# Patient Record
Sex: Male | Born: 1985
Health system: Southern US, Community
[De-identification: ages and names within clinical notes are randomized; demographics above are authoritative.]

## PROBLEM LIST (undated history)

## (undated) DIAGNOSIS — R0789 Other chest pain: Secondary | ICD-10-CM

## (undated) DIAGNOSIS — R002 Palpitations: Secondary | ICD-10-CM

## (undated) DIAGNOSIS — J45909 Unspecified asthma, uncomplicated: Secondary | ICD-10-CM

## (undated) DIAGNOSIS — F1910 Other psychoactive substance abuse, uncomplicated: Secondary | ICD-10-CM

## (undated) DIAGNOSIS — G4700 Insomnia, unspecified: Secondary | ICD-10-CM

## (undated) DIAGNOSIS — F1010 Alcohol abuse, uncomplicated: Secondary | ICD-10-CM

## (undated) DIAGNOSIS — F17200 Nicotine dependence, unspecified, uncomplicated: Secondary | ICD-10-CM

## (undated) HISTORY — DX: Other chest pain: R07.89

## (undated) HISTORY — DX: Insomnia, unspecified: G47.00

## (undated) HISTORY — DX: Nicotine dependence, unspecified, uncomplicated: F17.200

## (undated) HISTORY — DX: Unspecified asthma, uncomplicated: J45.909

## (undated) HISTORY — DX: Palpitations: R00.2

## (undated) HISTORY — DX: Alcohol abuse, uncomplicated: F10.10

---
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
## 2007-03-12 ENCOUNTER — Emergency Department: Payer: Self-pay | Admitting: Emergency Medicine

## 2007-03-13 ENCOUNTER — Emergency Department: Payer: Self-pay | Admitting: Emergency Medicine

## 2007-03-16 ENCOUNTER — Emergency Department: Payer: Self-pay | Admitting: Emergency Medicine

## 2007-03-22 ENCOUNTER — Emergency Department: Payer: Self-pay | Admitting: Emergency Medicine

## 2008-06-15 ENCOUNTER — Emergency Department (HOSPITAL_COMMUNITY): Admission: EM | Admit: 2008-06-15 | Discharge: 2008-06-15 | Payer: Self-pay | Admitting: Family Medicine

## 2008-06-19 ENCOUNTER — Emergency Department (HOSPITAL_COMMUNITY): Admission: EM | Admit: 2008-06-19 | Discharge: 2008-06-19 | Payer: Self-pay | Admitting: Emergency Medicine

## 2010-06-08 ENCOUNTER — Emergency Department (HOSPITAL_COMMUNITY): Admission: EM | Admit: 2010-06-08 | Discharge: 2010-06-08 | Payer: Self-pay | Admitting: Emergency Medicine

## 2010-06-25 ENCOUNTER — Emergency Department (HOSPITAL_COMMUNITY)
Admission: EM | Admit: 2010-06-25 | Discharge: 2010-06-25 | Payer: Self-pay | Source: Home / Self Care | Admitting: Emergency Medicine

## 2010-10-02 LAB — CULTURE, ROUTINE-ABSCESS

## 2011-04-24 LAB — POCT I-STAT, CHEM 8
BUN: 10
Calcium, Ion: 1.2
Chloride: 105
Creatinine, Ser: 1.2
Glucose, Bld: 104 — ABNORMAL HIGH
HCT: 49
Hemoglobin: 16.7
Potassium: 3.8
Sodium: 141
TCO2: 26

## 2012-01-25 ENCOUNTER — Emergency Department (HOSPITAL_COMMUNITY): Payer: Self-pay

## 2012-01-25 ENCOUNTER — Emergency Department (HOSPITAL_COMMUNITY)
Admission: EM | Admit: 2012-01-25 | Discharge: 2012-01-25 | Disposition: A | Discharge status: Home / Self Care | Payer: Self-pay | Attending: Emergency Medicine | Admitting: Emergency Medicine

## 2012-01-25 ENCOUNTER — Encounter (HOSPITAL_COMMUNITY): Payer: Self-pay | Admitting: *Deleted

## 2012-01-25 ENCOUNTER — Encounter (HOSPITAL_COMMUNITY): Payer: Self-pay | Admitting: Emergency Medicine

## 2012-01-25 DIAGNOSIS — W2209XA Striking against other stationary object, initial encounter: Secondary | ICD-10-CM | POA: Insufficient documentation

## 2012-01-25 DIAGNOSIS — F17200 Nicotine dependence, unspecified, uncomplicated: Secondary | ICD-10-CM | POA: Insufficient documentation

## 2012-01-25 DIAGNOSIS — S63279A Dislocation of unspecified interphalangeal joint of unspecified finger, initial encounter: Secondary | ICD-10-CM | POA: Insufficient documentation

## 2012-01-25 DIAGNOSIS — IMO0002 Reserved for concepts with insufficient information to code with codable children: Secondary | ICD-10-CM | POA: Insufficient documentation

## 2012-01-25 DIAGNOSIS — S63259A Unspecified dislocation of unspecified finger, initial encounter: Secondary | ICD-10-CM

## 2012-01-25 DIAGNOSIS — Y9383 Activity, rough housing and horseplay: Secondary | ICD-10-CM | POA: Insufficient documentation

## 2012-01-25 DIAGNOSIS — S63289A Dislocation of proximal interphalangeal joint of unspecified finger, initial encounter: Secondary | ICD-10-CM

## 2012-01-25 NOTE — ED Notes (Signed)
PT was just discharged after finger being put back in place and finger became dislocated again.

## 2012-01-25 NOTE — ED Notes (Addendum)
Pt to radiology with tx person RS

## 2012-01-25 NOTE — ED Provider Notes (Signed)
Medical screening examination/treatment/procedure(s) were performed by non-physician practitioner and as supervising physician I was immediately available for consultation/collaboration.  Flint Melter, MD 01/25/12 [phone]

## 2012-01-25 NOTE — ED Notes (Signed)
Pt back from radiology 

## 2012-01-25 NOTE — ED Provider Notes (Signed)
History     CSN: [id number]  Arrival date & time 01/25/12  0301   First MD Initiated Contact with Patient 01/25/12 0320      Chief Complaint  Patient presents with  . Finger Injury   HPI  History provided by the patient. Patient is 26 year old male who returns with complaints of dislocation to his left fifth finger. Patient was seen and treated for similar injury earlier this morning. Patient was treated with successful clinical reduction and had normal function of the finger. Finger was wrapped the body tape. Patient states that he remove this when he returned home and was getting to the shower. He reports bumping his behavior is a shower edge and had a recurrence of dislocation when he flexed his finger. Patient attempted to try to reposition the finger but states that he can get back and was nervous about performing this reduction by himself at home. Patient reports having some pain and discomfort to the finger but states is minimal. He denies any numbness or tingling. Symptoms are described as mild. He denies any other aggravating or alleviating factors.    No past medical history on file.  No past surgical history on file.  No family history on file.  History  Substance Use Topics  . Smoking status: Current Everyday Smoker  . Smokeless tobacco: Not on file  . Alcohol Use: Yes      Review of Systems  Musculoskeletal:       Finger deformity  Neurological: Negative for numbness.    Allergies  Review of patient's allergies indicates no known allergies.  Home Medications  No current outpatient prescriptions on file.  BP 141/69  Pulse 101  Temp 98.6 F (37 C) (Oral)  Resp 16  SpO2 100%  Physical Exam  Nursing note and vitals reviewed. Constitutional: He is oriented to person, place, and time. He appears well-developed and well-nourished. No distress.  HENT:  Head: Normocephalic.  Cardiovascular: Normal rate and regular rhythm.   Pulmonary/Chest: Effort normal  and breath sounds normal.  Musculoskeletal:       Similar deformity of left fifth finger with flexion and radial displacement of distal portion of finger at PIP. Normal distal medial and lateral sensation still finger. Normal cap refill less than 2 seconds.  Neurological: He is alert and oriented to person, place, and time.  Skin: Skin is warm.  Psychiatric: He has a normal mood and affect. His behavior is normal.    ED Course  Reduction of dislocation Date/Time: 01/25/2012 4:00 AM Performed by: Angus Seller Authorized by: Angus Seller Consent: Verbal consent obtained. Risks and benefits: risks, benefits and alternatives were discussed Consent given by: patient Patient identity confirmed: verbally with patient Time out: Immediately prior to procedure a "time out" was called to verify the correct patient, procedure, equipment, support staff and site/side marked as required. Local anesthesia used: no Patient tolerance: Patient tolerated the procedure well with no immediate complications. Comments: Reduction left fifth finger.  Post reduction films obtained showing good anatomical alignment.     Dg Finger Little Left  01/25/2012  *RADIOLOGY REPORT*  Clinical Data: Left finger pain  LEFT LITTLE FINGER 2+V  Comparison: None.  Findings: There is lateral subluxation and hyperflexion of the fifth digit at the PIP joint.  No fracture identified.  No aggressive osseous lesion.  IMPRESSION: Lateral subluxation and hyperflexion of the fifth digit at the PIP joint.  Original Report Authenticated By: Waneta Martins, M.D.     1.  Dislocation of finger PIP joint       MDM  3:20 AM patient seen and evaluated. Patient returns with recurrence of dislocation to left fifth finger. Patient was seen and treated for this earlier this morning.        Angus Seller, [place] 01/25/12 [phone]

## 2012-01-25 NOTE — Progress Notes (Signed)
Orthopedic Tech Progress Note Patient Details:  Marc Henderson 1986/04/12 [id number]  Ortho Devices Type of Ortho Device: Finger splint Ortho Device/Splint Location: Left hand, 5th digit Ortho Device/Splint Interventions: Application   [name] 01/25/2012, 5:27 AM

## 2012-01-25 NOTE — ED Notes (Signed)
Deformity of the  Lt little finger playing around

## 2012-01-25 NOTE — ED Provider Notes (Signed)
History     CSN: [id number]  Arrival date & time 01/25/12  0000   First MD Initiated Contact with Patient 01/25/12 0026      Chief Complaint  Patient presents with  . finger deformity    HPI  History provided by the patient. Patient is a 26 year old male with no significant past medical history presents with complaint place of left little finger injury and deformity. Patient states he was playing around with brother wrestling when he had injury to his finger. Patient complains of deformity and pain. Patient has reduced range of motion secondary deformity. He denies any other aggravating or alleviating factors. Patient denies any other associated symptoms. Denies any numbness.   History reviewed. No pertinent past medical history.  History reviewed. No pertinent past surgical history.  No family history on file.  History  Substance Use Topics  . Smoking status: Current Everyday Smoker  . Smokeless tobacco: Not on file  . Alcohol Use: Yes      Review of Systems  Musculoskeletal:       Finger deformity  Neurological: Negative for weakness and numbness.    Allergies  Review of patient's allergies indicates no known allergies.  Home Medications  No current outpatient prescriptions on file.  BP 136/74  Pulse 104  Temp 98.3 F (36.8 C) (Oral)  Resp 20  SpO2 97%  Physical Exam  Nursing note and vitals reviewed. Constitutional: He is oriented to person, place, and time. He appears well-developed and well-nourished. No distress.  HENT:  Head: Normocephalic.  Cardiovascular: Normal rate and regular rhythm.   Pulmonary/Chest: Effort normal and breath sounds normal.  Musculoskeletal:       Reduced range of motion of left fifth finger. There is deformity with flexion of the finger at the PIP. Normal medial and lateral sensations. Normal cap refill less than 2 seconds.  Neurological: He is alert and oriented to person, place, and time.  Skin: Skin is warm. No erythema.    Psychiatric: He has a normal mood and affect. His behavior is normal.    ED Course  Reduction of dislocation Date/Time: 01/25/2012 1:00 AM Performed by: Angus Seller Authorized by: Angus Seller Consent: Verbal consent obtained. Risks and benefits: risks, benefits and alternatives were discussed Consent given by: patient Imaging studies: imaging studies available Patient identity confirmed: verbally with patient Time out: Immediately prior to procedure a "time out" was called to verify the correct patient, procedure, equipment, support staff and site/side marked as required. Local anesthesia used: no Patient tolerance: Patient tolerated the procedure well with no immediate complications. Comments: Reduction of left fifth finger successful with full range of motion, strength and neurovascularly intact post reduction.    Dg Finger Little Left  01/25/2012  *RADIOLOGY REPORT*  Clinical Data: Left finger pain  LEFT LITTLE FINGER 2+V  Comparison: None.  Findings: There is lateral subluxation and hyperflexion of the fifth digit at the PIP joint.  No fracture identified.  No aggressive osseous lesion.  IMPRESSION: Lateral subluxation and hyperflexion of the fifth digit at the PIP joint.  Original Report Authenticated By: Waneta Martins, M.D.     1. Finger dislocation       MDM  12:50 AM patient seen and evaluated. Patient in no acute distress.        Angus Seller, PA 01/25/12 0121  Angus Seller, PA 01/25/12 [phone]

## 2012-01-26 NOTE — ED Provider Notes (Signed)
Medical screening examination/treatment/procedure(s) were performed by non-physician practitioner and as supervising physician I was immediately available for consultation/collaboration.  Cristi Gwynn, MD 01/26/12 0435 

## 2012-08-04 ENCOUNTER — Emergency Department (INDEPENDENT_AMBULATORY_CARE_PROVIDER_SITE_OTHER): Payer: Self-pay

## 2012-08-04 ENCOUNTER — Emergency Department (INDEPENDENT_AMBULATORY_CARE_PROVIDER_SITE_OTHER)
Admission: EM | Admit: 2012-08-04 | Discharge: 2012-08-04 | Disposition: A | Discharge status: Home / Self Care | Payer: Self-pay | Source: Home / Self Care | Attending: Family Medicine | Admitting: Family Medicine

## 2012-08-04 ENCOUNTER — Encounter (HOSPITAL_COMMUNITY): Payer: Self-pay

## 2012-08-04 DIAGNOSIS — J45909 Unspecified asthma, uncomplicated: Secondary | ICD-10-CM

## 2012-08-04 MED ORDER — ALBUTEROL SULFATE HFA 108 (90 BASE) MCG/ACT IN AERS: 1.0000 | INHALATION_SPRAY | Freq: Four times a day (QID) | RESPIRATORY_TRACT | Status: DC | PRN

## 2012-08-04 MED ORDER — ALBUTEROL SULFATE (5 MG/ML) 0.5% IN NEBU
INHALATION_SOLUTION | RESPIRATORY_TRACT | Status: AC
  Filled 2012-08-04: qty 1

## 2012-08-04 MED ORDER — IPRATROPIUM BROMIDE 0.02 % IN SOLN
0.5000 mg | Freq: Once | RESPIRATORY_TRACT | Status: AC
  Administered 2012-08-04: 0.5 mg via RESPIRATORY_TRACT

## 2012-08-04 MED ORDER — ALBUTEROL SULFATE (5 MG/ML) 0.5% IN NEBU
2.5000 mg | INHALATION_SOLUTION | Freq: Once | RESPIRATORY_TRACT | Status: AC
  Administered 2012-08-04: 2.5 mg via RESPIRATORY_TRACT

## 2012-08-04 MED ORDER — ALBUTEROL SULFATE HFA 108 (90 BASE) MCG/ACT IN AERS
2.0000 | INHALATION_SPRAY | RESPIRATORY_TRACT | Status: DC
  Administered 2012-08-04: 2 via RESPIRATORY_TRACT

## 2012-08-04 MED ORDER — PREDNISONE 10 MG PO TABS: ORAL_TABLET | ORAL | Status: DC

## 2012-08-04 MED ORDER — ALBUTEROL SULFATE HFA 108 (90 BASE) MCG/ACT IN AERS
INHALATION_SPRAY | RESPIRATORY_TRACT | Status: AC
  Filled 2012-08-04: qty 6.7

## 2012-08-04 NOTE — ED Notes (Signed)
Reported URI x 5-6 days

## 2012-08-04 NOTE — ED Provider Notes (Signed)
History     CSN: [id number]  Arrival date & time 08/04/12  1706   First MD Initiated Contact with Patient 08/04/12 1729      Chief Complaint  Patient presents with  . Cough    (Consider location/radiation/quality/duration/timing/severity/associated sxs/prior treatment) Patient is a 27 y.o. male presenting with cough. The history is provided by the patient. No language interpreter was used.  Cough This is a new problem. The problem occurs constantly. The problem has been gradually worsening. The cough is productive of sputum. There has been no fever. Associated symptoms include shortness of breath and wheezing. He has tried nothing for the symptoms. The treatment provided no relief. He is a smoker. His past medical history does not include pneumonia or asthma.    History reviewed. No pertinent past medical history.  History reviewed. No pertinent past surgical history.  History reviewed. No pertinent family history.  History  Substance Use Topics  . Smoking status: Current Every Day Smoker  . Smokeless tobacco: Not on file  . Alcohol Use: Yes      Review of Systems  Respiratory: Positive for cough, shortness of breath and wheezing.   All other systems reviewed and are negative.    Allergies  Review of patient's allergies indicates no known allergies.  Home Medications  No current outpatient prescriptions on file.  BP 132/84  Pulse 113  Temp 98.1 F (36.7 C) (Oral)  Resp 20  SpO2 95%  Physical Exam  Constitutional: He is oriented to person, place, and time. He appears well-developed and well-nourished.  HENT:  Head: Normocephalic.  Eyes: EOM are normal. Pupils are equal, round, and reactive to light.  Neck: Normal range of motion.  Pulmonary/Chest: He has wheezes.  Abdominal: Soft. He exhibits no distension.  Musculoskeletal: Normal range of motion.  Neurological: He is alert and oriented to person, place, and time.  Skin: Skin is warm.  Psychiatric: He  has a normal mood and affect.    ED Course  Procedures (including critical care time)  Labs Reviewed - No data to display Dg Chest 2 View  08/04/2012  *RADIOLOGY REPORT*  Clinical Data: Cough, congestion, shortness of breath, fever  CHEST - 2 VIEW  Comparison: None.  Findings: Normal cardiac silhouette and mediastinal contours. There is mild diffuse thickening of the pulmonary interstitium.  No focal airspace opacities.  No pleural effusion or pneumothorax.  No acute osseous abnormalities.  IMPRESSION: Findings suggestive of airways disease/bronchitis.  No focal airspace opacities to suggest pneumonia.   Original Report Authenticated By: Tacey Ruiz, MD      1. Asthma       MDM  Pt given prednisone, albuterol and atrovent x 1 then repeat albuterol.   Pt feels much better.  Pt advised stop smoking.   Primary care feferrals given and pt counseled on asthma        Lonia Skinner [place], [place] 08/04/12 2030

## 2012-08-09 NOTE — ED Provider Notes (Signed)
Medical screening examination/treatment/procedure(s) were performed by resident physician or non-physician practitioner and as supervising physician I was immediately available for consultation/collaboration.   Barkley Bruns MD.    Linna Hoff, MD 08/09/12 1723

## 2013-11-05 ENCOUNTER — Encounter (HOSPITAL_COMMUNITY): Payer: Self-pay | Admitting: Emergency Medicine

## 2013-11-05 ENCOUNTER — Emergency Department (INDEPENDENT_AMBULATORY_CARE_PROVIDER_SITE_OTHER)
Admission: EM | Admit: 2013-11-05 | Discharge: 2013-11-05 | Disposition: A | Discharge status: Home / Self Care | Payer: Self-pay | Source: Home / Self Care | Attending: Family Medicine | Admitting: Family Medicine

## 2013-11-05 DIAGNOSIS — S61209A Unspecified open wound of unspecified finger without damage to nail, initial encounter: Secondary | ICD-10-CM

## 2013-11-05 DIAGNOSIS — S61219A Laceration without foreign body of unspecified finger without damage to nail, initial encounter: Secondary | ICD-10-CM

## 2013-11-05 DIAGNOSIS — W269XXA Contact with unspecified sharp object(s), initial encounter: Secondary | ICD-10-CM

## 2013-11-05 NOTE — ED Notes (Signed)
Laceration to right middle finger .  Numbness to posterior middle finger.  Pocket knife closed on finger.

## 2013-11-05 NOTE — ED Notes (Signed)
Patient reports last tetanus 7 years ago

## 2013-11-05 NOTE — Discharge Instructions (Signed)

## 2013-11-05 NOTE — ED Provider Notes (Signed)
CSN: [id number]     Arrival date & time 11/05/13  1426 History   First MD Initiated Contact with Patient 11/05/13 1644     Chief Complaint  Patient presents with  . Laceration   (Consider location/radiation/quality/duration/timing/severity/associated sxs/prior Treatment) Patient is a 28 y.o. male presenting with skin laceration. The history is provided by the patient. No language interpreter was used.  Laceration Location:  Finger Finger laceration location:  L middle finger Length (cm):  1 Depth:  Through dermis Bleeding: controlled   Time since incident:  2 hours Laceration mechanism:  Knife Pain details:    Quality:  Aching   Severity:  Mild Foreign body present:  No foreign bodies Worsened by:  Nothing tried Ineffective treatments:  None tried Tetanus status:  Up to date   History reviewed. No pertinent past medical history. History reviewed. No pertinent past surgical history. No family history on file. History  Substance Use Topics  . Smoking status: Current Every Day Smoker  . Smokeless tobacco: Not on file  . Alcohol Use: Yes    Review of Systems  Allergies  Review of patient's allergies indicates no known allergies.  Home Medications   Prior to Admission medications   Medication Sig Start Date End Date Taking? Authorizing Provider  albuterol (PROVENTIL HFA;VENTOLIN HFA) 108 (90 BASE) MCG/ACT inhaler Inhale 1-2 puffs into the lungs every 6 (six) hours as needed for wheezing. 08/04/12   Elson AreasLeslie K Eustacia Urbanek, PA-C  predniSONE (DELTASONE) 10 MG tablet 5,4,3,2,1 taper 08/04/12   Elson AreasLeslie K Nocholas Damaso, PA-C   BP 123/69  Pulse 58  Temp(Src) 98.2 F (36.8 C) (Oral)  Resp 14  SpO2 100% Physical Exam  Constitutional: He appears well-developed and well-nourished.  HENT:  Head: Normocephalic.  Musculoskeletal: He exhibits tenderness.  1cm laceration dorsal aspect of 3rd finger  Neurological: He is alert.  Skin: Skin is warm.  Psychiatric: He has a normal mood and affect.     ED Course  LACERATION REPAIR Date/Time: 11/05/2013 6:21 PM Performed by: Elson AreasSOFIA, Jontavious Commons K Authorized by: Elson AreasSOFIA, Nicha Hemann K Consent: Verbal consent obtained. Consent given by: patient Patient understanding: patient states understanding of the procedure being performed Body area: upper extremity Location details: left long finger Laceration length: 1 cm Foreign bodies: no foreign bodies Tendon involvement: none Nerve involvement: none Vascular damage: no Skin closure: glue Patient tolerance: Patient tolerated the procedure well with no immediate complications.   (including critical care time) Labs Review Labs Reviewed - No data to display  Results for orders placed during the hospital encounter of 06/25/10  CULTURE, ROUTINE-ABSCESS      Result Value Ref Range   Specimen Description ABSCESS NECK     Special Requests NONE     Gram Stain       Value: ABUNDANT WBC PRESENT,BOTH PMN AND MONONUCLEAR     FEW SQUAMOUS EPITHELIAL CELLS PRESENT     FEW GRAM POSITIVE COCCI IN PAIRS     IN CLUSTERS   Culture       Value: MODERATE METHICILLIN RESISTANT STAPHYLOCOCCUS AUREUS     Note: RIFAMPIN AND GENTAMICIN SHOULD NOT BE USED AS SINGLE DRUGS FOR TREATMENT OF STAPH INFECTIONS. This organism DOES NOT demonstrate inducible Clindamycin resistance in vitro. CRITICAL RESULT CALLED TO, READ BACK BY AND VERIFIED WITH: TERESA 06/28/10      AT 845 AM BY [facility]   Report Status 06/28/2010 FINAL     Organism ID, Bacteria METHICILLIN RESISTANT STAPHYLOCOCCUS AUREUS     Imaging Review No results found.  MDM   1. Laceration of finger        Elson AreasLeslie K Timira Bieda, PA-C 11/05/13 1823  Lonia SkinnerLeslie K RaymondSofia, [place] JerseyPA-C 11/05/13 1824

## 2013-11-06 NOTE — ED Provider Notes (Signed)
Medical screening examination/treatment/procedure(s) were performed by a resident physician or non-physician practitioner and as the supervising physician I was immediately available for consultation/collaboration.  Tiona Ruane, MD    Hendry Speas S Anjolaoluwa Siguenza, MD 11/06/13 0805 

## 2014-02-15 ENCOUNTER — Encounter (HOSPITAL_COMMUNITY): Payer: Self-pay | Admitting: Emergency Medicine

## 2014-02-15 ENCOUNTER — Emergency Department (HOSPITAL_COMMUNITY)
Admission: EM | Admit: 2014-02-15 | Discharge: 2014-02-15 | Disposition: A | Discharge status: Home / Self Care | Payer: Self-pay | Attending: Emergency Medicine | Admitting: Emergency Medicine

## 2014-02-15 DIAGNOSIS — R61 Generalized hyperhidrosis: Secondary | ICD-10-CM | POA: Insufficient documentation

## 2014-02-15 DIAGNOSIS — F17200 Nicotine dependence, unspecified, uncomplicated: Secondary | ICD-10-CM | POA: Insufficient documentation

## 2014-02-15 DIAGNOSIS — F1110 Opioid abuse, uncomplicated: Secondary | ICD-10-CM | POA: Insufficient documentation

## 2014-02-15 DIAGNOSIS — R6883 Chills (without fever): Secondary | ICD-10-CM | POA: Insufficient documentation

## 2014-02-15 DIAGNOSIS — Z79899 Other long term (current) drug therapy: Secondary | ICD-10-CM | POA: Insufficient documentation

## 2014-02-15 DIAGNOSIS — R197 Diarrhea, unspecified: Secondary | ICD-10-CM | POA: Insufficient documentation

## 2014-02-15 LAB — COMPREHENSIVE METABOLIC PANEL
ALT: 24 U/L (ref 0–53)
AST: 20 U/L (ref 0–37)
Albumin: 4.3 g/dL (ref 3.5–5.2)
Alkaline Phosphatase: 57 U/L (ref 39–117)
Anion gap: 15 (ref 5–15)
BILIRUBIN TOTAL: 0.4 mg/dL (ref 0.3–1.2)
BUN: 10 mg/dL (ref 6–23)
CO2: 25 meq/L (ref 19–32)
Calcium: 10 mg/dL (ref 8.4–10.5)
Chloride: 97 mEq/L (ref 96–112)
Creatinine, Ser: 0.91 mg/dL (ref 0.50–1.35)
GFR calc Af Amer: >90 mL/min (ref >90)
GFR calc non Af Amer: >90 mL/min (ref >90)
GLUCOSE: 112 mg/dL — AB (ref 70–99)
POTASSIUM: 4 meq/L (ref 3.7–5.3)
Sodium: 137 mEq/L (ref 137–147)
TOTAL PROTEIN: 7.6 g/dL (ref 6.0–8.3)

## 2014-02-15 LAB — CBC WITH DIFFERENTIAL/PLATELET
Basophils Absolute: 0.1 10*3/uL (ref 0.0–0.1)
Basophils Relative: 1 % (ref 0–1)
EOS ABS: 0.3 10*3/uL (ref 0.0–0.7)
EOS PCT: 4 % (ref 0–5)
HCT: 43.5 % (ref 39.0–52.0)
HEMOGLOBIN: 15.5 g/dL (ref 13.0–17.0)
LYMPHS ABS: 1.6 10*3/uL (ref 0.7–4.0)
LYMPHS PCT: 25 % (ref 12–46)
MCH: 29.7 pg (ref 26.0–34.0)
MCHC: 35.6 g/dL (ref 30.0–36.0)
MCV: 83.3 fL (ref 78.0–100.0)
MONOS PCT: 6 % (ref 3–12)
Monocytes Absolute: 0.4 10*3/uL (ref 0.1–1.0)
NEUTROS PCT: 64 % (ref 43–77)
Neutro Abs: 4 10*3/uL (ref 1.7–7.7)
Platelets: 255 10*3/uL (ref 150–400)
RBC: 5.22 MIL/uL (ref 4.22–5.81)
RDW: 12.4 % (ref 11.5–15.5)
WBC: 6.2 10*3/uL (ref 4.0–10.5)

## 2014-02-15 LAB — SALICYLATE LEVEL

## 2014-02-15 LAB — ETHANOL

## 2014-02-15 LAB — ACETAMINOPHEN LEVEL

## 2014-02-15 MED ORDER — LOPERAMIDE HCL 2 MG PO CAPS: 2.0000 mg | ORAL_CAPSULE | ORAL | Status: DC | PRN

## 2014-02-15 MED ORDER — CLONIDINE HCL 0.1 MG PO TABS: 0.1000 mg | ORAL_TABLET | Freq: Every day | ORAL | Status: DC

## 2014-02-15 MED ORDER — METHOCARBAMOL 500 MG PO TABS: 500.0000 mg | ORAL_TABLET | Freq: Three times a day (TID) | ORAL | Status: DC | PRN

## 2014-02-15 MED ORDER — NAPROXEN 500 MG PO TABS: 500.0000 mg | ORAL_TABLET | Freq: Two times a day (BID) | ORAL | Status: DC | PRN

## 2014-02-15 MED ORDER — IBUPROFEN 200 MG PO TABS: 600.0000 mg | ORAL_TABLET | Freq: Three times a day (TID) | ORAL | Status: DC | PRN

## 2014-02-15 MED ORDER — ONDANSETRON HCL 4 MG PO TABS: 4.0000 mg | ORAL_TABLET | Freq: Three times a day (TID) | ORAL | Status: DC | PRN

## 2014-02-15 MED ORDER — DICYCLOMINE HCL 20 MG PO TABS: 20.0000 mg | ORAL_TABLET | Freq: Four times a day (QID) | ORAL | Status: DC | PRN

## 2014-02-15 MED ORDER — HYDROXYZINE HCL 25 MG PO TABS: 25.0000 mg | ORAL_TABLET | Freq: Four times a day (QID) | ORAL | Status: DC | PRN

## 2014-02-15 MED ORDER — CLONIDINE HCL 0.1 MG PO TABS: 0.1000 mg | ORAL_TABLET | ORAL | Status: DC

## 2014-02-15 MED ORDER — ONDANSETRON 8 MG PO TBDP: 8.0000 mg | ORAL_TABLET | Freq: Three times a day (TID) | ORAL | Status: DC | PRN

## 2014-02-15 MED ORDER — ONDANSETRON 4 MG PO TBDP: 4.0000 mg | ORAL_TABLET | Freq: Four times a day (QID) | ORAL | Status: DC | PRN

## 2014-02-15 MED ORDER — ZOLPIDEM TARTRATE 5 MG PO TABS: 5.0000 mg | ORAL_TABLET | Freq: Every evening | ORAL | Status: DC | PRN

## 2014-02-15 MED ORDER — CLONIDINE HCL 0.1 MG PO TABS: 0.1000 mg | ORAL_TABLET | Freq: Four times a day (QID) | ORAL | Status: DC | PRN

## 2014-02-15 MED ORDER — CLONIDINE HCL 0.1 MG PO TABS: 0.1000 mg | ORAL_TABLET | Freq: Four times a day (QID) | ORAL | Status: DC

## 2014-02-15 MED ORDER — NICOTINE 21 MG/24HR TD PT24: 21.0000 mg | MEDICATED_PATCH | Freq: Every day | TRANSDERMAL | Status: DC

## 2014-02-15 NOTE — ED Provider Notes (Signed)
CSN: [id number]     Arrival date & time 02/15/14  0745 History   First MD Initiated Contact with Patient 02/15/14 [phone]     Chief Complaint  Patient presents with  . detox    HPI Comments: Patient is a 28 y.o. Male who presents to the ED with desire to detox from narcotics.  Patient is with his mother.  Patient states that he has been taking "like 60 pills" a day for the past several years.  He takes whatever he can get his hands on including opana, oxycodone, hydrocodone.  Patient states that up until two weeks ago he was attending a methadone clinic; however he could not financially afford to continue to go there.  Patient states that he was using narcotics on top of the 130 mg of methadone that he was given there.  Patient also admits to using some cocaine, benzodiazepines, smoking marijuana, and drinking alcohol.  Patient took opana this morning, smoked weed last night, and last used cocaine 2 weeks ago.  Patient's mother is worried that he will OD.  She has called [facility] and several other facilities to try and get him into outpatient therapy.  Patient states that he is having diarrhea and cold sweats.    The history is provided by the patient. No language interpreter was used.    History reviewed. No pertinent past medical history. History reviewed. No pertinent past surgical history. History reviewed. No pertinent family history. History  Substance Use Topics  . Smoking status: Current Every Day Smoker -- 1.00 packs/day for 8 years  . Smokeless tobacco: Not on file  . Alcohol Use: Yes     Comment: 1-2 beer/daily    Review of Systems  Constitutional: Positive for chills and diaphoresis. Negative for fever and fatigue.  Respiratory: Negative for chest tightness and shortness of breath.   Cardiovascular: Negative for chest pain and palpitations.  Gastrointestinal: Positive for diarrhea.  Genitourinary: Negative for dysuria, urgency, frequency, hematuria, flank pain, decreased urine  volume and difficulty urinating.  All other systems reviewed and are negative.     Allergies  Review of patient's allergies indicates no known allergies.  Home Medications   Prior to Admission medications   Medication Sig Start Date End Date Taking? Authorizing Provider  albuterol (PROVENTIL HFA;VENTOLIN HFA) 108 (90 BASE) MCG/ACT inhaler Inhale 2 puffs into the lungs every 6 (six) hours as needed for wheezing or shortness of breath.   Yes Historical Provider, MD  cloNIDine (CATAPRES) 0.1 MG tablet Take 1 tablet (0.1 mg total) by mouth every 6 (six) hours as needed (withdrawl symptoms). 02/15/14   Lyanne Co, MD  ondansetron (ZOFRAN ODT) 8 MG disintegrating tablet Take 1 tablet (8 mg total) by mouth every 8 (eight) hours as needed for nausea or vomiting. 02/15/14   Lyanne Co, MD  zolpidem (AMBIEN) 5 MG tablet Take 1 tablet (5 mg total) by mouth at bedtime as needed for sleep. 02/15/14   Lyanne Co, MD   BP 149/90  Pulse 86  Temp(Src) 97.9 F (36.6 C) (Oral)  Resp 16  SpO2 100% Physical Exam  Nursing note and vitals reviewed. Constitutional: He is oriented to person, place, and time. He appears well-developed and well-nourished. No distress.  HENT:  Head: Normocephalic and atraumatic.  Mouth/Throat: Oropharynx is clear and moist. No oropharyngeal exudate.  Eyes: Conjunctivae and EOM are normal. Pupils are equal, round, and reactive to light. No scleral icterus.  Neck: Normal range of motion. Neck supple. No  JVD present. No thyromegaly present.  Cardiovascular: Normal rate, regular rhythm, normal heart sounds and intact distal pulses.  Exam reveals no gallop and no friction rub.   No murmur heard. Pulmonary/Chest: Effort normal and breath sounds normal. No respiratory distress. He has no wheezes. He has no rales. He exhibits no tenderness.  Abdominal: Soft. Bowel sounds are normal. He exhibits no distension and no mass. There is no tenderness. There is no rebound and no  guarding.  Musculoskeletal: Normal range of motion.  Lymphadenopathy:    He has no cervical adenopathy.  Neurological: He is alert and oriented to person, place, and time. No cranial nerve deficit. Coordination normal.  Skin: Skin is warm and dry. He is not diaphoretic.  Psychiatric: He has a normal mood and affect. His behavior is normal. Judgment and thought content normal.    ED Course  Procedures (including critical care time) Labs Review Labs Reviewed  COMPREHENSIVE METABOLIC PANEL - Abnormal; Notable for the following:    Glucose, Bld 112 (*)    All other components within normal limits  SALICYLATE LEVEL - Abnormal; Notable for the following:    Salicylate Lvl <2.0 (*)    All other components within normal limits  CBC WITH DIFFERENTIAL  ETHANOL  ACETAMINOPHEN LEVEL    Imaging Review No results found.   EKG Interpretation None      MDM   Final diagnoses:  Opioid abuse   Patient is a 28 y.o male who presents to the ED with desire for opiod detox.  Patient was seen and examined by me at this time.  Patient has no deficits on exam at this time.  Patient was also seen by Dr. Patria Maneampos.  Patient was given a list of resources for outpatient therapy.  Patient is stable for discharge at this time.      Eben Burowourtney A Forcucci, PA-C 02/15/14 1302

## 2014-02-15 NOTE — Discharge Instructions (Signed)
Opioid Withdrawal °Opioids are a group of narcotic drugs. They include the street drug heroin. They also include pain medicines, such as morphine, hydrocodone, oxycodone, and fentanyl. Opioid withdrawal is a group of characteristic physical and mental signs and symptoms. It typically occurs if you have been using opioids daily for several weeks or longer and stop using or rapidly decrease use. Opioid withdrawal can also occur if you have used opioids daily for a long time and are given a medicine to block the effect.  °SIGNS AND SYMPTOMS °Opioid withdrawal includes three or more of the following symptoms:  °· Depressed, anxious, or irritable mood. °· Nausea or vomiting. °· Muscle aches or spasms.   °· Watery eyes.    °· Runny nose. °· Dilated pupils, sweating, or hairs standing on end. °· Diarrhea or intestinal cramping. °· Yawning.   °· Fever. °· Increased blood pressure. °· Fast pulse. °· Restlessness or trouble sleeping. °These signs and symptoms occur within several hours of stopping or reducing short-acting opioids, such as heroin. They can occur within 3 days of stopping or reducing long-acting opioids, such as methadone. Withdrawal begins within minutes of receiving a drug that blocks the effects of opioids, such as naltrexone or naloxone. °DIAGNOSIS  °Opioid use disorder is diagnosed by your health care provider. You will be asked about your symptoms, drug and alcohol use, medical history, and use of medicines. A physical exam may be done. Lab tests may be ordered. Your health care provider may have you see a mental health professional.  °TREATMENT  °The treatment for opioid withdrawal is usually provided by medical doctors with special training in substance use disorders (addiction specialists). The following medicines may be included in treatment: °· Opioids given in place of the abused opioid. They turn on opioid receptors in the brain and lessen or prevent withdrawal symptoms. They are gradually  decreased (opioid substitution and taper). °· Non-opioids that can lessen certain opioid withdrawal symptoms. They may be used alone or with opioid substitution and taper. °Successful long-term recovery usually requires medicine, counseling, and group support. °HOME CARE INSTRUCTIONS  °· Take medicines only as directed by your health care provider. °· Check with your health care provider before starting new medicines. °· Keep all follow-up visits as directed by your health care provider. °SEEK MEDICAL CARE IF: °· You are not able to take your medicines as directed. °· Your symptoms get worse. °· You relapse. °SEEK IMMEDIATE MEDICAL CARE IF: °· You have serious thoughts about hurting yourself or others. °· You have a seizure. °· You lose consciousness. °Document Released: 07/12/2003 Document Revised: 11/23/2013 Document Reviewed: 07/22/2013 °ExitCare® Patient Information ©2015 [employer]. This information is not intended to replace advice given to you by your health care provider. Make sure you discuss any questions you have with your health care provider. ° °Emergency Department Resource Guide °1) Find a Doctor and Pay Out of Pocket °Although you won't have to find out who is covered by your insurance plan, it is a good idea to ask around and get recommendations. You will then need to call the office and see if the doctor you have chosen will accept you as a new patient and what types of options they offer for patients who are self-pay. Some doctors offer discounts or will set up payment plans for their patients who do not have insurance, but you will need to ask so you aren't surprised when you get to your appointment. ° °2) Contact Your Local Health Department °Not all health departments have   doctors that can see patients for sick visits, but many do, so it is worth a call to see if yours does. If you don't know where your local health department is, you can check in your phone book. The CDC also has a tool to  help you locate your state's health department, and many state websites also have listings of all of their local health departments. ° °3) Find a Walk-in Clinic °If your illness is not likely to be very severe or complicated, you may want to try a walk in clinic. These are popping up all over the country in pharmacies, drugstores, and shopping centers. They're usually staffed by nurse practitioners or physician assistants that have been trained to treat common illnesses and complaints. They're usually fairly quick and inexpensive. However, if you have serious medical issues or chronic medical problems, these are probably not your best option. ° °No Primary Care Doctor: °- Call [facility] at  832-8000 - they can help you locate a primary care doctor that  accepts your insurance, provides certain services, etc. °- Physician Referral Service- 1-800-533-3463 ° °Chronic Pain Problems: °Organization         Address  Phone   Notes  °[facility]  (336) 297-2271 Patients need to be referred by their primary care doctor.  ° °Medication Assistance: °Organization         Address  Phone   Notes  °[place] [facility] [address] °[place], [place] [address] (336) 641-8030 --Must be a resident of [place] °-- Must have NO insurance coverage whatsoever (no Medicaid/ Medicare, etc.) °-- The pt. MUST have a primary care doctor that directs their care regularly and follows them in the community °  °[facility]  (866) 331-1348   °[employer]  (888) 892-1162   ° °Agencies that provide inexpensive medical care: °Organization         Address  Phone   Notes  °[facility]  (336) 832-8035   °[facility]    (336) 832-7272   °[facility] [address] °[place], [place] [address] (336) 832-4777   °[facility] [address], °[place] (336) 271-4999   °[facility]    (336) 373-0678   °[facility]    (336) 272-1050   °[facility] ° [address], [place] Phone:  (336) 832-4444, Fax:  (336) 832-4440 Hours of Operation:  9 am - 6 pm, M-F.  Also accepts Medicaid/Medicare and self-pay.  °[facility] ° [address], [place] Phone: (336) 832-3150, Fax: (336) 832-3151. Hours of Operation:  8:30 am - 5:30 pm, M-F.  Also accepts Medicaid and self-pay.  °[facility] [place] [address], [place] Phone: (336) 878-6027   °[facility] [address], [place], [place] (336)723-1848, Ext. 123 Mondays & Thursdays: 7-9 AM.  First 15 patients are seen on a first come, first serve basis. °  ° °Medicaid-accepting [place] Providers: ° °Organization         Address  Phone   Notes  °[facility] [address], [place] (336) 641-2100 Also accepts self-pay patients.  °[facility] [address], [place] ° (336) 856-9996   °[facility] [address], [place] (336) 288-8857   °[facility] [address]   [place] [address], [place] (336) 299-7000   °Veita Bland [address], [place]  ° (336) 373-1557 Only accepts [place] Access Medicaid patients after they have their name applied to their card.  ° °Self-Pay (no insurance) in [place]: ° °Organization         Address  Phone   Notes  °Sickle Cell Patients, [facility] [address], [place] (336) 832-1970   °[facility] [address], [place] (336) 832-4400   °[facility] ° [address], [place] (336) 992-4800   °[facility]/Dr. Osei-Bonsu ° [address] [place] [address], [place] or [address], [place] (336) 841-8500 Phone number for both [place] and [place] locations is the same.  °[facility] [address],  [place] (336) 299-0000   °[facility] [address] [place] [address], [place] or [address] (336) 852-7530 °(336) 878-2260   °[facility] [address], [place] (336) 350-1642, phone; (336) 294-5005, fax Sees patients 1st and 3rd Saturday of every month.  Must not qualify for public or private insurance (i.e. Medicaid, Medicare, [place] Health Choice, Veterans' Benefits) • Household income should be no more than 200% of the poverty level •The clinic cannot treat you if you are pregnant or think you are pregnant • Sexually transmitted diseases are not treated at the clinic.  ° ° °Dental Care: °Organization         Address  Phone  Notes  °[facility] [address], [place] (336) 641-6152 Accepts children up to age 21 who are enrolled in Medicaid or [place] Health Choice; pregnant women with a Medicaid card; and children who have applied for Medicaid or  [place] Health Choice, but were declined, whose parents can pay a reduced fee at time of service.  °[facility] [place]  [address], [place] (336) 641-7733 Accepts children up to age 21 who are enrolled in Medicaid or [place] Health Choice; pregnant women with a Medicaid card; and children who have applied for Medicaid or [place] Health Choice, but were declined, whose parents can pay a reduced fee at time of service.  °[facility] ° [address], [place] (336) 641-4533 Patients are seen by appointment only. Walk-ins are not accepted. [facility] will see patients 18 years of age and older. °Monday - Tuesday (8am-5pm) °Most Wednesdays (8:30-5pm) °$30 per visit, cash only  °[facility] ° [address], [place] (336) 641-4533 Patients are seen by appointment only. Walk-ins are not accepted. [facility] will see patients 18 years of age and older. °One Wednesday Evening  (Monthly: Volunteer Based).  $30 per visit, cash only  °[facility]  (919) 537-3737 for adults; Children under age 4, call Graduate Pediatric Dentistry at (919) [phone]. Children aged 4-14, please call (919) [phone] to request a pediatric application. ° Dental services are provided in all areas of dental care including fillings, crowns and bridges, complete and partial dentures, implants, gum treatment, root canals, and extractions. Preventive care is also provided. Treatment is provided to both adults and children. °Patients are selected via a lottery and there is often a waiting list. °  °[facility] [address], °[place] ° (336) 763-8833 [email or web address] °  °[facility] [address], [place], [place] (336)723-1848, Ext. 123 Second and Fourth Thursday of each month, opens at 6:30 AM; Clinic ends at 9 AM.  Patients are seen on a first-come first-served basis, and a limited number are seen during each clinic.  ° °[facility] ° [address], [place], [place] (336) 723-7904   Eligibility Requirements °You must have lived in [place], [place], or [place] counties for at least the last three months. °  You cannot be eligible for state or federal sponsored healthcare insurance, including Veterans Administration, Medicaid, or Medicare. °  You generally cannot be eligible for healthcare insurance through your employer.  °  How to apply: °Eligibility screenings are held every Tuesday and Wednesday afternoon from 1:00 pm until 4:00 pm. You do not need an appointment for the interview!  °[facility] [address], [place], [place] 336-631-2330   °[facility]  336-342-8273   °[facility]  336-703-3100   °[facility]  336-570-6415   ° °Behavioral Health Resources in the Community: °Intensive Outpatient Programs °Organization         Address  Phone  Notes  °[place]  [facility] [address], [place], [place] 336-878-6098   °[facility] [address], [place], [place] 336-832-9800   °[facility] [address], [place], [place] ° 336-882-2125   °[facility] [address],  °[place], [place] 1-800-853-5163 or 336-641-4981   °Substance Abuse Resources °Organization         Address  Phone  Notes  °[facility]  336-882-2125   °[facility]  336-784-9470   °[facility]  336-285-9073   °[facility]  336-845-3988   °Residential & Outpatient Substance Abuse Program  1-800-659-3381   °Psychological Services °Organization         Address  Phone  Notes  °[facility]  336- 832-9600   °[facility]  336- 378-7881   °[facility] [address], [place] 1-800-853-5163 or 336-641-4981   ° °Mobile Crisis Teams °Organization         Address  Phone  Notes  °[facility], Mobile Crisis Care Unit  1-877-626-1772   °Assertive °[facility] ° [address] [place], [place] 336-834-9664   °Sharon DeEsch [address] °[place] 336-554-5454   ° °Self-Help/Support Groups °Organization         Address  Phone             Notes  °[facility] - variety of support groups  336- 373-1402 Call for more information  °Narcotics Anonymous (NA), [facility] [address], °[place]  2 meetings at this location  ° °Residential Treatment Programs °Organization         Address  Phone  Notes  °[facility] [address],    °[place]  1-866-801-8205   °[facility] ° [address], [place], [place] 704-293-8524   °[facility] [address], [place] 336-845-3988 Admissions: 8am-3pm M-F  °[facility] [address]    °[place], [place] 336-841-1104   °[facility] [address],  [place], [place] 336-379-7146   °[facility] [address]  °[place], [place] 336-285-9073   °[facility] Intensive Outpatient [address], [place],   [place] 336-852-3033   °[facility] [address]  °[place], [place] 1-877-615-2722 or 336-784-9470   °[facility] [address] [place], [place] 336-227-7417 Accepts Medicaid  °[facility] [address]  °[place] 1-800-659-3381 Substance Abuse/Addiction Treatment  ° °[place] Behavioral Health Resources °Organization         Address  Phone  Notes  °[facility]  (888) 581-9988   °Julie Brannon, PhD [address] [place], [place]   (336) 349-5553 or (336) 951-0000   °[facility]   [address] °[place], [place] (336) 349-4454   °[facility] [address], [place], [place] (336) 342-8316 Insurance/Medicaid/sponsorship through [facility]  °[facility] [address] [place] [address]                                    [place], [place] (336) 342-8316 Therapy/tele-psych/case  °[facility] [address]  ° [place], [place] (336) 349-2233    °Dr. Arfeen  (336) 349-4544   °[facility] [place]  [employer] [place] [facility] 1) [address], [place] °2) [address], [place] °3)  [address], [place] (336) 349-3220 °(336) 342-7768 ° °(336) 342-8140   °[place] [facility] (336) 342-1394 or (336) 342-3537 (After Hours)    ° ° °

## 2014-02-15 NOTE — ED Notes (Signed)
Pt reports opioid use x10 years, methadone, dilaudid, percocet, and morphine. Last use this morning. Pt reports he drinks 1-2 beers daily, but only wants detox from pain pills. Reports he was released from a methadone detox clinic on Thursday. Denies pain. Denies SI/HI, AH/VH.

## 2014-02-15 NOTE — ED Notes (Signed)
Pt escorted to discharge window. Pt verbalized understanding discharge instructions. In no acute distress.  

## 2014-02-16 ENCOUNTER — Emergency Department: Payer: Self-pay | Admitting: Emergency Medicine

## 2014-02-16 LAB — DRUG SCREEN, URINE
Amphetamines, Ur Screen: NEGATIVE (ref ?–1000)
Barbiturates, Ur Screen: NEGATIVE (ref ?–200)
Benzodiazepine, Ur Scrn: POSITIVE (ref ?–200)
COCAINE METABOLITE, UR ~~LOC~~: NEGATIVE (ref ?–300)
Cannabinoid 50 Ng, Ur ~~LOC~~: NEGATIVE (ref ?–50)
MDMA (Ecstasy)Ur Screen: NEGATIVE (ref ?–500)
Methadone, Ur Screen: NEGATIVE (ref ?–300)
OPIATE, UR SCREEN: POSITIVE (ref ?–300)
PHENCYCLIDINE (PCP) UR S: NEGATIVE (ref ?–25)
Tricyclic, Ur Screen: NEGATIVE (ref ?–1000)

## 2014-02-16 LAB — URINALYSIS, COMPLETE
BLOOD: NEGATIVE
Bacteria: NONE SEEN
Bilirubin,UR: NEGATIVE
Glucose,UR: NEGATIVE mg/dL (ref 0–75)
Ketone: NEGATIVE
LEUKOCYTE ESTERASE: NEGATIVE
Nitrite: NEGATIVE
Ph: 6 (ref 4.5–8.0)
Protein: NEGATIVE
SPECIFIC GRAVITY: 1.016 (ref 1.003–1.030)
Squamous Epithelial: NONE SEEN
WBC UR: 2 /HPF (ref 0–5)

## 2014-02-16 LAB — COMPREHENSIVE METABOLIC PANEL
ALT: 37 U/L
ANION GAP: 7 (ref 7–16)
Albumin: 4.2 g/dL (ref 3.4–5.0)
Alkaline Phosphatase: 63 U/L
BUN: 9 mg/dL (ref 7–18)
Bilirubin,Total: 0.5 mg/dL (ref 0.2–1.0)
CREATININE: 0.98 mg/dL (ref 0.60–1.30)
Calcium, Total: 9.3 mg/dL (ref 8.5–10.1)
Chloride: 104 mmol/L (ref 98–107)
Co2: 27 mmol/L (ref 21–32)
GLUCOSE: 108 mg/dL — AB (ref 65–99)
Osmolality: 275 (ref 275–301)
Potassium: 4.1 mmol/L (ref 3.5–5.1)
SGOT(AST): 21 U/L (ref 15–37)
Sodium: 138 mmol/L (ref 136–145)
Total Protein: 8.1 g/dL (ref 6.4–8.2)

## 2014-02-16 LAB — CBC
HCT: 47.2 % (ref 40.0–52.0)
HGB: 16.5 g/dL (ref 13.0–18.0)
MCH: 29.8 pg (ref 26.0–34.0)
MCHC: 34.8 g/dL (ref 32.0–36.0)
MCV: 86 fL (ref 80–100)
PLATELETS: 304 10*3/uL (ref 150–440)
RBC: 5.51 10*6/uL (ref 4.40–5.90)
RDW: 12.8 % (ref 11.5–14.5)
WBC: 8.2 10*3/uL (ref 3.8–10.6)

## 2014-02-16 LAB — ACETAMINOPHEN LEVEL: Acetaminophen: <2 ug/mL

## 2014-02-16 LAB — ETHANOL

## 2014-02-16 LAB — SALICYLATE LEVEL: SALICYLATES, SERUM: 2.3 mg/dL

## 2014-02-16 NOTE — ED Provider Notes (Signed)
Medical screening examination/treatment/procedure(s) were conducted as a shared visit with non-physician practitioner(s) and myself.  I personally evaluated the patient during the encounter.   EKG Interpretation None      The patient presents today with narcotic abuse.  There is no indication for involuntary commitment for inpatient treatment.  I think the patient is best managed as an outpatient for his opioid abuse.  The patient will be discharged home with a prescription for clonidine, Ambien, and antiemitics. Understands to return to ER for new or worsening symptoms   Lyanne CoKevin M Judaea Burgoon, MD 02/16/14 [phone]

## 2014-05-05 ENCOUNTER — Emergency Department (HOSPITAL_COMMUNITY)
Admission: EM | Admit: 2014-05-05 | Discharge: 2014-05-05 | Disposition: A | Discharge status: Home / Self Care | Payer: Self-pay | Attending: Emergency Medicine | Admitting: Emergency Medicine

## 2014-05-05 ENCOUNTER — Encounter (HOSPITAL_COMMUNITY): Payer: Self-pay | Admitting: Emergency Medicine

## 2014-05-05 ENCOUNTER — Emergency Department (HOSPITAL_COMMUNITY): Payer: Self-pay

## 2014-05-05 DIAGNOSIS — R0602 Shortness of breath: Secondary | ICD-10-CM

## 2014-05-05 DIAGNOSIS — Z720 Tobacco use: Secondary | ICD-10-CM | POA: Insufficient documentation

## 2014-05-05 DIAGNOSIS — Z79899 Other long term (current) drug therapy: Secondary | ICD-10-CM | POA: Insufficient documentation

## 2014-05-05 DIAGNOSIS — J45901 Unspecified asthma with (acute) exacerbation: Secondary | ICD-10-CM | POA: Insufficient documentation

## 2014-05-05 MED ORDER — ALBUTEROL SULFATE HFA 108 (90 BASE) MCG/ACT IN AERS: 1.0000 | INHALATION_SPRAY | Freq: Four times a day (QID) | RESPIRATORY_TRACT | Status: DC | PRN

## 2014-05-05 MED ORDER — PREDNISONE 20 MG PO TABS: ORAL_TABLET | ORAL | Status: DC

## 2014-05-05 MED ORDER — ALBUTEROL (5 MG/ML) CONTINUOUS INHALATION SOLN
15.0000 mg/h | INHALATION_SOLUTION | RESPIRATORY_TRACT | Status: AC
  Administered 2014-05-05: 15 mg/h via RESPIRATORY_TRACT

## 2014-05-05 MED ORDER — ALBUTEROL SULFATE HFA 108 (90 BASE) MCG/ACT IN AERS
INHALATION_SPRAY | RESPIRATORY_TRACT | Status: AC
  Administered 2014-05-05: 14:00:00
  Filled 2014-05-05: qty 6.7

## 2014-05-05 MED ORDER — IPRATROPIUM BROMIDE 0.02 % IN SOLN
0.5000 mg | Freq: Once | RESPIRATORY_TRACT | Status: AC
  Administered 2014-05-05: 0.5 mg via RESPIRATORY_TRACT
  Filled 2014-05-05: qty 2.5

## 2014-05-05 MED ORDER — AZITHROMYCIN 250 MG PO TABS: ORAL_TABLET | ORAL | Status: DC

## 2014-05-05 MED ORDER — PREDNISONE 20 MG PO TABS
60.0000 mg | ORAL_TABLET | Freq: Once | ORAL | Status: AC
  Administered 2014-05-05: 60 mg via ORAL
  Filled 2014-05-05: qty 3

## 2014-05-05 NOTE — ED Notes (Signed)
Per pt sts he has been having issues with wheezing and having to use inhaler. sts the inhaler helps,. Pt never been dx with asthma. sts cough and congestion.

## 2014-05-05 NOTE — Discharge Planning (Signed)
[facility] Community [facility] Eligibility Specialist ° °Spoke to the patient regarding primary care resources and the [facility] orange card. Orange card application and instructions on where to take the completed application given. Resource guide and my contact information also provided for any future questions or concerns.  ° °

## 2014-05-05 NOTE — Discharge Instructions (Signed)
Asthma, Acute Bronchospasm °Acute bronchospasm caused by asthma is also referred to as an asthma attack. Bronchospasm means your air passages become narrowed. The narrowing is caused by inflammation and tightening of the muscles in the air tubes (bronchi) in your lungs. This can make it hard to breathe or cause you to wheeze and cough. °CAUSES °Possible triggers are: °· Animal dander from the skin, hair, or feathers of animals. °· Dust mites contained in house dust. °· Cockroaches. °· Pollen from trees or grass. °· Mold. °· Cigarette or tobacco smoke. °· Air pollutants such as dust, household cleaners, hair sprays, aerosol sprays, paint fumes, strong chemicals, or strong odors. °· Cold air or weather changes. Cold air may trigger inflammation. Winds increase molds and pollens in the air. °· Strong emotions such as crying or laughing hard. °· Stress. °· Certain medicines such as aspirin or beta-blockers. °· Sulfites in foods and drinks, such as dried fruits and wine. °· Infections or inflammatory conditions, such as a flu, cold, or inflammation of the nasal membranes (rhinitis). °· Gastroesophageal reflux disease (GERD). GERD is a condition where stomach acid backs up into your esophagus. °· Exercise or strenuous activity. °SIGNS AND SYMPTOMS  °· Wheezing. °· Excessive coughing, particularly at night. °· Chest tightness. °· Shortness of breath. °DIAGNOSIS  °Your health care provider will ask you about your medical history and perform a physical exam. A chest X-ray or blood testing may be performed to look for other causes of your symptoms or other conditions that may have triggered your asthma attack.  °TREATMENT  °Treatment is aimed at reducing inflammation and opening up the airways in your lungs.  Most asthma attacks are treated with inhaled medicines. These include quick relief or rescue medicines (such as bronchodilators) and controller medicines (such as inhaled corticosteroids). These medicines are sometimes  given through an inhaler or a nebulizer. Systemic steroid medicine taken by mouth or given through an IV tube also can be used to reduce the inflammation when an attack is moderate or severe. Antibiotic medicines are only used if a bacterial infection is present.  °HOME CARE INSTRUCTIONS  °· Rest. °· Drink plenty of liquids. This helps the mucus to remain thin and be easily coughed up. Only use caffeine in moderation and do not use alcohol until you have recovered from your illness. °· Do not smoke. Avoid being exposed to secondhand smoke. °· You play a critical role in keeping yourself in good health. Avoid exposure to things that cause you to wheeze or to have breathing problems. °· Keep your medicines up-to-date and available. Carefully follow your health care provider's treatment plan. °· Take your medicine exactly as prescribed. °· When pollen or pollution is bad, keep windows closed and use an air conditioner or go to places with air conditioning. °· Asthma requires careful medical care. See your health care provider for a follow-up as advised. If you are more than [redacted] weeks pregnant and you were prescribed any new medicines, let your obstetrician know about the visit and how you are doing. Follow up with your health care provider as directed. °· After you have recovered from your asthma attack, make an appointment with your outpatient doctor to talk about ways to reduce the likelihood of future attacks. If you do not have a doctor who manages your asthma, make an appointment with a primary care doctor to discuss your asthma. °SEEK IMMEDIATE MEDICAL CARE IF:  °· You are getting worse. °· You have trouble breathing. If severe, call your local   emergency services (911 in the [place] °· You develop chest pain or discomfort. °· You are vomiting. °· You are not able to keep fluids down. °· You are coughing up yellow, green, brown, or bloody sputum. °· You have a fever and your symptoms suddenly get worse. °· You have  trouble swallowing. °MAKE SURE YOU:  °· Understand these instructions. °· Will watch your condition. °· Will get help right away if you are not doing well or get worse. °Document Released: 10/24/2006 Document Revised: 07/14/2013 Document Reviewed: 01/14/2013 °ExitCare® Patient Information ©2015 [employer]. This information is not intended to replace advice given to you by your health care provider. Make sure you discuss any questions you have with your health care provider. ° ° °Emergency Department Resource Guide °1) Find a Doctor and Pay Out of Pocket °Although you won't have to find out who is covered by your insurance plan, it is a good idea to ask around and get recommendations. You will then need to call the office and see if the doctor you have chosen will accept you as a new patient and what types of options they offer for patients who are self-pay. Some doctors offer discounts or will set up payment plans for their patients who do not have insurance, but you will need to ask so you aren't surprised when you get to your appointment. ° °2) Contact Your Local Health Department °Not all health departments have doctors that can see patients for sick visits, but many do, so it is worth a call to see if yours does. If you don't know where your local health department is, you can check in your phone book. The CDC also has a tool to help you locate your state's health department, and many state websites also have listings of all of their local health departments. ° °3) Find a Walk-in Clinic °If your illness is not likely to be very severe or complicated, you may want to try a walk in clinic. These are popping up all over the country in pharmacies, drugstores, and shopping centers. They're usually staffed by nurse practitioners or physician assistants that have been trained to treat common illnesses and complaints. They're usually fairly quick and inexpensive. However, if you have serious medical issues or chronic  medical problems, these are probably not your best option. ° °No Primary Care Doctor: °- Call [facility] at  832-8000 - they can help you locate a primary care doctor that  accepts your insurance, provides certain services, etc. °- Physician Referral Service- 1-800-533-3463 ° °Chronic Pain Problems: °Organization         Address  Phone   Notes  °[facility]  (336) 297-2271 Patients need to be referred by their primary care doctor.  ° °Medication Assistance: °Organization         Address  Phone   Notes  °[place] [facility] [address] °[place], [place] [address] (336) 641-8030 --Must be a resident of [place] °-- Must have NO insurance coverage whatsoever (no Medicaid/ Medicare, etc.) °-- The pt. MUST have a primary care doctor that directs their care regularly and follows them in the community °  °[facility]  (866) 331-1348   °[employer]  (888) 892-1162   ° °Agencies that provide inexpensive medical care: °Organization         Address  Phone   Notes  °[facility]  (336) 832-8035   °[facility]    (  336) 832-7272   °[facility] [address] °[place], [place] [address] (336) 832-4777   °[facility] [address], °[place] (336) 271-4999   °[facility]    (336) 373-0678   °[facility]    (336) 272-1050   °[facility] ° [address], [place] Phone:  (336) 832-4444, Fax:  (336) 832-4440 Hours of Operation:  9 am - 6 pm, M-F.  Also accepts Medicaid/Medicare and self-pay.  °[facility] ° [address], [place] Phone: (336) 832-3150, Fax: (336) 832-3151. Hours of Operation:  8:30 am - 5:30 pm, M-F.  Also accepts Medicaid and self-pay.  °[facility] [place] [address], [place] Phone: (336) 878-6027   °[facility] [address], [place], [place] (336)723-1848,  Ext. 123 Mondays & Thursdays: 7-9 AM.  First 15 patients are seen on a first come, first serve basis. °  ° °Medicaid-accepting [place] Providers: ° °Organization         Address  Phone   Notes  °[facility] [address], [place] (336) 641-2100 Also accepts self-pay patients.  °[facility] [address], [place] ° (336) 856-9996   °[facility] [address], [place] (336) 288-8857   °[facility] [address] [place] [address], [place] (336) 299-7000   °Veita Bland [address], [place]  ° (336) 373-1557 Only accepts [place] Access Medicaid patients after they have their name applied to their card.  ° °Self-Pay (no insurance) in [place]: ° °Organization         Address  Phone   Notes  °Sickle Cell Patients, [facility] [address], [place] (336) 832-1970   °[facility] [address], [place] (336) 832-4400   °[facility] [place] ° [address],  (336) 992-4800   °[facility]/Dr. Osei-Bonsu ° [address] [place] [address], [place] or [address], [place] (336) 841-8500 Phone number for both [place] and [place] locations is the same.  °[facility] [address], [place] (336) 299-0000   °[facility] [address] [place] [address], [place] or [address] (336) 852-7530 °(336) 878-2260   °[facility] [address], [place] (336) 350-1642, phone; (336) 294-5005, fax Sees patients 1st and 3rd Saturday of every month.  Must not qualify for public or private insurance (i.e. Medicaid, Medicare, [place] Health Choice, Veterans' Benefits) • Household income should be no more than 200% of the poverty level •The clinic cannot treat you if you are pregnant or think you are pregnant • Sexually transmitted diseases are not  treated at the clinic.  ° ° °Dental Care: °Organization         Address  Phone  Notes  °[facility] [facility] [address],  (336) 641-6152 Accepts children up to age 21 who are enrolled in Medicaid or [place] Health Choice; pregnant women with a Medicaid card; and children who have applied for Medicaid or [place] Health Choice, but were declined, whose parents can pay a reduced fee at time of service.  °[facility] [place]  [address], [place] (336) 641-7733 Accepts children up   to age 21 who are enrolled in Medicaid or [place] Health Choice; pregnant women with a Medicaid card; and children who have applied for Medicaid or [place] Health Choice, but were declined, whose parents can pay a reduced fee at time of service.  °[facility] ° [address], [place] (336) 641-4533 Patients are seen by appointment only. Walk-ins are not accepted. [facility] will see patients 18 years of age and older. °Monday - Tuesday (8am-5pm) °Most Wednesdays (8:30-5pm) °$30 per visit, cash only  °[facility] ° [address], [place] (336) 641-4533 Patients are seen by appointment only. Walk-ins are not accepted. [facility] will see patients 18 years of age and older. °One Wednesday Evening (Monthly: Volunteer Based).  $30 per visit, cash only  °[facility]  (919) 537-3737 for adults; Children under age 4, call Graduate Pediatric Dentistry at (919) [phone]. Children aged 4-14, please call (919) [phone] to request a pediatric application. ° Dental services are provided in all areas of dental care including fillings, crowns and bridges, complete and partial dentures, implants, gum treatment, root canals, and extractions. Preventive care is also provided. Treatment is provided to both adults and children. °Patients are selected via a lottery and there is  often a waiting list. °  °[facility] [address], °[place] ° (336) 763-8833 [email or web address] °  °[facility] [address], [place], [place] (336)723-1848, Ext. 123 Second and Fourth Thursday of each month, opens at 6:30 AM; Clinic ends at 9 AM.  Patients are seen on a first-come first-served basis, and a limited number are seen during each clinic.  ° °[facility] ° [address], [place], [place] (336) 723-7904   Eligibility Requirements °You must have lived in [place], [place], or [place] counties for at least the last three months. °  You cannot be eligible for state or federal sponsored healthcare insurance, including Veterans Administration, Medicaid, or Medicare. °  You generally cannot be eligible for healthcare insurance through your employer.  °  How to apply: °Eligibility screenings are held every Tuesday and Wednesday afternoon from 1:00 pm until 4:00 pm. You do not need an appointment for the interview!  °[facility] [address], [place], [place] 336-631-2330   °[facility]  336-342-8273   °[facility]  336-703-3100   °[facility]  336-570-6415   ° °Behavioral Health Resources in the Community: °Intensive Outpatient Programs °Organization         Address  Phone  Notes  °[facility] [address], [place], [place] 336-878-6098   °[facility] [address], [place], [place] 336-832-9800   °[facility] [address], , [place] ° 336-882-2125   °[facility] [address],  °, [place] 1-800-853-5163 or 336-641-4981   °Substance Abuse Resources °Organization         Address  Phone  Notes  °[facility]  336-882-2125   °[facility]  336-784-9470   °[facility]  336-285-9073   °[facility]  336-845-3988   °Residential & Outpatient Substance  Abuse Program  1-800-659-3381   °Psychological Services °Organization         Address  Phone  Notes  °[facility]  336- 832-9600   °[facility]  336- 378-7881   °[facility]   [address], [place] 1-800-853-5163 or 336-641-4981   ° °Mobile Crisis Teams °Organization         Address  Phone  Notes  °[facility], Mobile Crisis Care Unit  1-877-626-1772   °Assertive °[facility] ° [address] [place], [place] 336-834-9664   °Sharon DeEsch [address] °[place] 336-554-5454   ° °Self-Help/Support Groups °Organization         Address  Phone             Notes  °[facility] - variety of support groups  336- 373-1402 Call for more information  °Narcotics Anonymous (NA), [facility] [address], °[place]  2 meetings at this location  ° °Residential Treatment Programs °Organization         Address  Phone  Notes  °[facility] [address],    °[place]  1-866-801-8205   °[facility] ° [address], [place], [place] 704-293-8524   °[facility] [address], [place] 336-845-3988 Admissions: 8am-3pm M-F  °[facility] [address]    °[place], [place] 336-841-1104   °[facility] [address], [place], [place] 336-379-7146   °[facility] [address]  °[place], [place] 336-285-9073   °[facility] Intensive Outpatient [address], [place], [place] 336-852-3033   °[facility] [address]  °[place], [place] 1-877-615-2722 or 336-784-9470   °[facility] [address] [place], [place] 336-227-7417 Accepts Medicaid  °[facility] [address]  ° [place] 1-800-659-3381 Substance Abuse/Addiction Treatment  ° °[place] Behavioral Health Resources °Organization          Address  Phone  Notes  °[facility]  (888) 581-9988   °Julie Brannon, PhD [address] [place], [place]   (336) 349-5553 or (336) 951-0000   °[facility]   [address] °[place], [place] (336) 349-4454   °[facility] [address], [place], [place] (336) 342-8316 Insurance/Medicaid/sponsorship through [facility]  °[facility] [address]                                    [place], [place] (336) 342-8316 Therapy/tele-psych/case  °[facility] [address]  ° [place], [place] (336) 349-2233    °Dr. Arfeen  (336) 349-4544   °[facility] [place]  [employer] [place] [facility] 1) [address], [place] °2) [address], [place] °3)  [address], [place] (336) 349-3220 °(336) 342-7768 ° °(336) 342-8140   °[place] [facility] (336) 342-1394 or (336) 342-3537 (After Hours)    ° ° ° °

## 2014-05-05 NOTE — ED Provider Notes (Signed)
CSN: [id number]     Arrival date & time 05/05/14  1024 History   First MD Initiated Contact with Patient 05/05/14 1057     Chief Complaint  Patient presents with  . Cough  . Sore Throat     (Consider location/radiation/quality/duration/timing/severity/associated sxs/prior Treatment) Patient is a 28 y.o. male presenting with cough and pharyngitis. The history is provided by the patient.  Cough Cough characteristics:  Non-productive Severity:  Moderate Onset quality:  Gradual Duration:  3 weeks Timing:  Constant Progression:  Unchanged Chronicity:  Recurrent Smoker: yes   Context: upper respiratory infection   Context: not sick contacts   Relieved by:  Nothing Worsened by:  Nothing tried Associated symptoms: shortness of breath   Associated symptoms: no fever   Sore Throat Associated symptoms include shortness of breath. Pertinent negatives include no abdominal pain.    History reviewed. No pertinent past medical history. History reviewed. No pertinent past surgical history. History reviewed. No pertinent family history. History  Substance Use Topics  . Smoking status: Current Every Day Smoker -- 1.00 packs/day for 8 years  . Smokeless tobacco: Not on file  . Alcohol Use: Yes     Comment: 1-2 beer/daily    Review of Systems  Constitutional: Negative for fever.  HENT: Positive for congestion.   Respiratory: Positive for cough and shortness of breath.   Gastrointestinal: Negative for vomiting and abdominal pain.  All other systems reviewed and are negative.     Allergies  Review of patient's allergies indicates no known allergies.  Home Medications   Prior to Admission medications   Medication Sig Start Date End Date Taking? Authorizing Provider  albuterol (PROVENTIL HFA;VENTOLIN HFA) 108 (90 BASE) MCG/ACT inhaler Inhale 2 puffs into the lungs every 6 (six) hours as needed for wheezing or shortness of breath.   Yes Historical Provider, MD  albuterol (PROVENTIL  HFA;VENTOLIN HFA) 108 (90 BASE) MCG/ACT inhaler Inhale 1-2 puffs into the lungs every 6 (six) hours as needed for wheezing or shortness of breath. 05/05/14   Elwin MochaBlair Gizel Riedlinger, MD  azithromycin (ZITHROMAX Z-PAK) 250 MG tablet 2 po day one, then 1 daily x 4 days 05/05/14   Elwin MochaBlair Darean Rote, MD  predniSONE (DELTASONE) 20 MG tablet 2 tabs po daily x 4 days 05/05/14   Elwin MochaBlair Braison Snoke, MD   BP 104/60  Pulse 92  Temp(Src) 97.5 F (36.4 C) (Oral)  Resp 17  Ht 5\' 11"  (1.803 m)  Wt 225 lb (102.059 kg)  BMI 31.39 kg/m2  SpO2 94% Physical Exam  Constitutional: He is oriented to person, place, and time. He appears well-developed and well-nourished. No distress.  HENT:  Head: Normocephalic and atraumatic.  Mouth/Throat: Oropharynx is clear and moist. No oropharyngeal exudate.  Eyes: EOM are normal. Pupils are equal, round, and reactive to light.  Neck: Normal range of motion. Neck supple.  Cardiovascular: Normal rate and regular rhythm.  Exam reveals no friction rub.   No murmur heard. Pulmonary/Chest: He is in respiratory distress (mild). He has decreased breath sounds (diffuse). He has wheezes (mild, scattered). He has no rales.  Abdominal: He exhibits no distension. There is no tenderness. There is no rebound.  Musculoskeletal: Normal range of motion. He exhibits no edema.  Neurological: He is alert and oriented to person, place, and time.  Skin: No rash noted. He is not diaphoretic.    ED Course  Procedures (including critical care time) Labs Review Labs Reviewed - No data to display  Imaging Review Dg Chest 2 View  05/05/2014   CLINICAL DATA:  Shortness of breath, productive cough and sore throat for 3 weeks.  EXAM: CHEST  2 VIEW  COMPARISON:  PA and lateral chest 08/04/2012.  FINDINGS: There is some peribronchial thickening which appears unchanged. The lungs are clear without consolidative process, pneumothorax or effusion. Heart size is normal. No focal bony abnormality is identified.  IMPRESSION:  Findings compatible with bronchitis.  No focal process.   Electronically Signed   By: Drusilla Kannerhomas  Dalessio M.D.   On: 05/05/2014 11:22     EKG Interpretation None      MDM   Final diagnoses:  Asthma exacerbation  Shortness of breath    13F presents with wheezing and SOB. Present for past 3 weeks, no fevers. Has been using his mother's inhaler for past several months, which will help. Here with moderate SOB and diminished air movement.  Hour-long albuterol with great improvement, he's feeling much better. Given steroids, albuterol inhaler, Z-pack. Patient encouraged to get PCP.     Elwin MochaBlair Ritisha Deitrick, MD 05/05/14 [phone]
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
## 2015-03-15 ENCOUNTER — Other Ambulatory Visit: Payer: Self-pay

## 2015-03-15 ENCOUNTER — Encounter: Payer: Self-pay | Admitting: Emergency Medicine

## 2015-03-15 ENCOUNTER — Emergency Department: Payer: Self-pay

## 2015-03-15 ENCOUNTER — Emergency Department
Admission: EM | Admit: 2015-03-15 | Discharge: 2015-03-15 | Disposition: A | Discharge status: Home / Self Care | Payer: Self-pay | Attending: Emergency Medicine | Admitting: Emergency Medicine

## 2015-03-15 DIAGNOSIS — R42 Dizziness and giddiness: Secondary | ICD-10-CM | POA: Insufficient documentation

## 2015-03-15 DIAGNOSIS — Z720 Tobacco use: Secondary | ICD-10-CM | POA: Insufficient documentation

## 2015-03-15 LAB — URINALYSIS COMPLETE WITH MICROSCOPIC (ARMC ONLY)
Bacteria, UA: NONE SEEN
Bilirubin Urine: NEGATIVE
Glucose, UA: NEGATIVE mg/dL
Hgb urine dipstick: NEGATIVE
Ketones, ur: NEGATIVE mg/dL
Leukocytes, UA: NEGATIVE
Nitrite: NEGATIVE
Protein, ur: NEGATIVE mg/dL
Specific Gravity, Urine: 1.018 (ref 1.005–1.030)
Squamous Epithelial / HPF: NONE SEEN
pH: 5 (ref 5.0–8.0)

## 2015-03-15 LAB — CBC
HEMATOCRIT: 47.4 % (ref 40.0–52.0)
HEMOGLOBIN: 16.4 g/dL (ref 13.0–18.0)
MCH: 29.6 pg (ref 26.0–34.0)
MCHC: 34.5 g/dL (ref 32.0–36.0)
MCV: 85.6 fL (ref 80.0–100.0)
Platelets: 269 10*3/uL (ref 150–440)
RBC: 5.54 MIL/uL (ref 4.40–5.90)
RDW: 12.4 % (ref 11.5–14.5)
WBC: 5.1 10*3/uL (ref 3.8–10.6)

## 2015-03-15 LAB — BASIC METABOLIC PANEL
ANION GAP: 6 (ref 5–15)
BUN: 12 mg/dL (ref 6–20)
CO2: 26 mmol/L (ref 22–32)
CREATININE: 1.04 mg/dL (ref 0.61–1.24)
Calcium: 9.4 mg/dL (ref 8.9–10.3)
Chloride: 106 mmol/L (ref 101–111)
Glucose, Bld: 115 mg/dL — ABNORMAL HIGH (ref 65–99)
Potassium: 3.4 mmol/L — ABNORMAL LOW (ref 3.5–5.1)
SODIUM: 138 mmol/L (ref 135–145)

## 2015-03-15 MED ORDER — ONDANSETRON HCL 4 MG PO TABS: 4.0000 mg | ORAL_TABLET | Freq: Three times a day (TID) | ORAL | Status: DC | PRN

## 2015-03-15 MED ORDER — MECLIZINE HCL 25 MG PO TABS: 25.0000 mg | ORAL_TABLET | Freq: Once | ORAL | Status: DC

## 2015-03-15 MED ORDER — MECLIZINE HCL 12.5 MG PO TABS: 12.5000 mg | ORAL_TABLET | Freq: Three times a day (TID) | ORAL | Status: DC | PRN

## 2015-03-15 MED ORDER — ONDANSETRON 4 MG PO TBDP
4.0000 mg | ORAL_TABLET | Freq: Once | ORAL | Status: DC
Start: 2015-03-15 — End: 2015-03-15

## 2015-03-15 NOTE — ED Notes (Signed)
Pt discharged home after verbalizing understanding of discharge instructions; nad noted. 

## 2015-03-15 NOTE — Discharge Instructions (Signed)
Benign Positional Vertigo °Vertigo means you feel like you or your surroundings are moving when they are not. Benign positional vertigo is the most common form of vertigo. Benign means that the cause of your condition is not serious. Benign positional vertigo is more common in older adults. °CAUSES  °Benign positional vertigo is the result of an upset in the labyrinth system. This is an area in the middle ear that helps control your balance. This may be caused by a viral infection, head injury, or repetitive motion. However, often no specific cause is found. °SYMPTOMS  °Symptoms of benign positional vertigo occur when you move your head or eyes in different directions. Some of the symptoms may include: °· Loss of balance and falls. °· Vomiting. °· Blurred vision. °· Dizziness. °· Nausea. °· Involuntary eye movements (nystagmus). °DIAGNOSIS  °Benign positional vertigo is usually diagnosed by physical exam. If the specific cause of your benign positional vertigo is unknown, your caregiver may perform imaging tests, such as magnetic resonance imaging (MRI) or computed tomography (CT). °TREATMENT  °Your caregiver may recommend movements or procedures to correct the benign positional vertigo. Medicines such as meclizine, benzodiazepines, and medicines for nausea may be used to treat your symptoms. In rare cases, if your symptoms are caused by certain conditions that affect the inner ear, you may need surgery. °HOME CARE INSTRUCTIONS  °· Follow your caregiver's instructions. °· Move slowly. Do not make sudden body or head movements. °· Avoid driving. °· Avoid operating heavy machinery. °· Avoid performing any tasks that would be dangerous to you or others during a vertigo episode. °· Drink enough fluids to keep your urine clear or pale yellow. °SEEK IMMEDIATE MEDICAL CARE IF:  °· You develop problems with walking, weakness, numbness, or using your arms, hands, or legs. °· You have difficulty speaking. °· You develop  severe headaches. °· Your nausea or vomiting continues or gets worse. °· You develop visual changes. °· Your family or friends notice any behavioral changes. °· Your condition gets worse. °· You have a fever. °· You develop a stiff neck or sensitivity to light. °MAKE SURE YOU:  °· Understand these instructions. °· Will watch your condition. °· Will get help right away if you are not doing well or get worse. °Document Released: 04/16/2006 Document Revised: 10/01/2011 Document Reviewed: 03/29/2011 °ExitCare® Patient Information ©2015 [employer]. This information is not intended to replace advice given to you by your health care provider. Make sure you discuss any questions you have with your health care provider. ° °Dizziness °Dizziness is a common problem. It is a feeling of unsteadiness or light-headedness. You may feel like you are about to faint. Dizziness can lead to injury if you stumble or fall. A person of any age group can suffer from dizziness, but dizziness is more common in older adults. °CAUSES  °Dizziness can be caused by many different things, including: °· Middle ear problems. °· Standing for too long. °· Infections. °· An allergic reaction. °· Aging. °· An emotional response to something, such as the sight of blood. °· Side effects of medicines. °· Tiredness. °· Problems with circulation or blood pressure. °· Excessive use of alcohol or medicines, or illegal drug use. °· Breathing too fast (hyperventilation). °· An irregular heart rhythm (arrhythmia). °· A low red blood cell count (anemia). °· Pregnancy. °· Vomiting, diarrhea, fever, or other illnesses that cause body fluid loss (dehydration). °· Diseases or conditions such as Parkinson's disease, high blood pressure (hypertension), diabetes, and thyroid problems. °·   Exposure to extreme heat. °DIAGNOSIS  °Your health care provider will ask about your symptoms, perform a physical exam, and perform an electrocardiogram (ECG) to record the electrical  activity of your heart. Your health care provider may also perform other heart or blood tests to determine the cause of your dizziness. These may include: °· Transthoracic echocardiogram (TTE). During echocardiography, sound waves are used to evaluate how blood flows through your heart. °· Transesophageal echocardiogram (TEE). °· Cardiac monitoring. This allows your health care provider to monitor your heart rate and rhythm in real time. °· Holter monitor. This is a portable device that records your heartbeat and can help diagnose heart arrhythmias. It allows your health care provider to track your heart activity for several days if needed. °· Stress tests by exercise or by giving medicine that makes the heart beat faster. °TREATMENT  °Treatment of dizziness depends on the cause of your symptoms and can vary greatly. °HOME CARE INSTRUCTIONS  °· Drink enough fluids to keep your urine clear or pale yellow. This is especially important in very hot weather. In older adults, it is also important in cold weather. °· Take your medicine exactly as directed if your dizziness is caused by medicines. When taking blood pressure medicines, it is especially important to get up slowly. °¨ Rise slowly from chairs and steady yourself until you feel okay. °¨ In the morning, first sit up on the side of the bed. When you feel okay, stand slowly while holding onto something until you know your balance is fine. °· Move your legs often if you need to stand in one place for a long time. Tighten and relax your muscles in your legs while standing. °· Have someone stay with you for 1-2 days if dizziness continues to be a problem. Do this until you feel you are well enough to stay alone. Have the person call your health care provider if he or she notices changes in you that are concerning. °· Do not drive or use heavy machinery if you feel dizzy. °· Do not drink alcohol. °SEEK IMMEDIATE MEDICAL CARE IF:  °· Your dizziness or light-headedness  gets worse. °· You feel nauseous or vomit. °· You have problems talking, walking, or using your arms, hands, or legs. °· You feel weak. °· You are not thinking clearly or you have trouble forming sentences. It may take a friend or family member to notice this. °· You have chest pain, abdominal pain, shortness of breath, or sweating. °· Your vision changes. °· You notice any bleeding. °· You have side effects from medicine that seems to be getting worse rather than better. °MAKE SURE YOU:  °· Understand these instructions. °· Will watch your condition. °· Will get help right away if you are not doing well or get worse. °Document Released: 01/02/2001 Document Revised: 07/14/2013 Document Reviewed: 01/26/2011 °ExitCare® Patient Information ©2015 [employer]. This information is not intended to replace advice given to you by your health care provider. Make sure you discuss any questions you have with your health care provider. ° °
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
## 2015-03-15 NOTE — ED Provider Notes (Signed)
Time Seen: Approximately 1420  I have reviewed the triage notes  Chief Complaint: Dizziness   History of Present Illness: Marc Henderson is a 29 y.o. male who presents with some chronic intermittent vertiginous type symptoms. Patient states the symptoms usually come up when he is moving. He states he's noticed it more over the past week. He is to use his pass it occurred over the last several years. He states he had rather significant head trauma approximately 8 years ago was concerned that this may be some residual symptoms from that. He denies any feelings of lightheadedness and describes it as vertiginous and feeling off balance. He denies any rotary component. He states he's noticed some decreasing vision in his right eye but on reflection is occurred over again several years. He was told at one time he needed glasses but declined on and states he knows it more in his right eye than the left. He describes some mild headache and pain over the left of the occipital area. He denies any nausea or vomiting on a consistent basis though he states occasionally nauseated with a decreased appetite. Denies any focal weakness in either upper or lower extremities.   History reviewed. No pertinent past medical history.  There are no active problems to display for this patient.   History reviewed. No pertinent past surgical history.  History reviewed. No pertinent past surgical history.  Current Outpatient Rx  Name  Route  Sig  Dispense  Refill  . albuterol (PROVENTIL HFA;VENTOLIN HFA) 108 (90 BASE) MCG/ACT inhaler   Inhalation   Inhale 2 puffs into the lungs every 6 (six) hours as needed for wheezing or shortness of breath.         Marland Kitchen albuterol (PROVENTIL HFA;VENTOLIN HFA) 108 (90 BASE) MCG/ACT inhaler   Inhalation   Inhale 1-2 puffs into the lungs every 6 (six) hours as needed for wheezing or shortness of breath.   1 Inhaler   0   . azithromycin (ZITHROMAX Z-PAK) 250 MG tablet      2  po day one, then 1 daily x 4 days   6 tablet   0   . meclizine (ANTIVERT) 12.5 MG tablet   Oral   Take 1 tablet (12.5 mg total) by mouth 3 (three) times daily as needed for dizziness or nausea.   30 tablet   1   . ondansetron (ZOFRAN) 4 MG tablet   Oral   Take 1 tablet (4 mg total) by mouth every 8 (eight) hours as needed for nausea or vomiting.   21 tablet   0   . predniSONE (DELTASONE) 20 MG tablet      2 tabs po daily x 4 days   8 tablet   0     Allergies:  Review of patient's allergies indicates no known allergies.  Family History: History reviewed. No pertinent family history.  Social History: Social History  Substance Use Topics  . Smoking status: Current Every Day Smoker -- 1.00 packs/day for 8 years  . Smokeless tobacco: None  . Alcohol Use: Yes     Comment: 1-2 beer/daily     Review of Systems:   10 point review of systems was performed and was otherwise negative:  Constitutional: No fever Eyes: No visual disturbances ENT: No sore throat, ear pain Cardiac: No chest pain Respiratory: No shortness of breath, wheezing, or stridor Abdomen: No abdominal pain, no vomiting, No diarrhea Endocrine: No weight loss, No night sweats Extremities: No peripheral edema,  cyanosis Skin: No rashes, easy bruising Neurologic: No focal weakness, trouble with speech or swollowing Urologic: No dysuria, Hematuria, or urinary frequency   Physical Exam:  ED Triage Vitals  Enc Vitals Group     BP 03/15/15 1208 142/95 mmHg     Pulse Rate 03/15/15 1208 111     Resp 03/15/15 1208 20     Temp 03/15/15 1208 98.1 F (36.7 C)     Temp Source 03/15/15 1208 Oral     SpO2 03/15/15 1208 100 %     Weight 03/15/15 1208 220 lb (99.791 kg)     Height 03/15/15 1208 5\' 11"  (1.803 m)     Head Cir --      Peak Flow --      Pain Score 03/15/15 1209 2     Pain Loc --      Pain Edu? --      Excl. in GC? --     General: Awake , Alert , and Oriented times 3; GCS 15 Head: Normal  cephalic , atraumatic Eyes: Pupils equal , round, reactive to light Nose/Throat: No nasal drainage, patent upper airway without erythema or exudate.  Neck: Supple, Full range of motion, No anterior adenopathy or palpable thyroid masses Lungs: Clear to ascultation without wheezes , rhonchi, or rales Heart: Regular rate, regular rhythm without murmurs , gallops , or rubs Abdomen: Soft, non tender without rebound, guarding , or rigidity; bowel sounds positive and symmetric in all 4 quadrants. No organomegaly .        Extremities: 2 plus symmetric pulses. No edema, clubbing or cyanosis Neurologic: normal ambulation, Motor symmetric without deficits, sensory intact Skin: warm, dry, no rashes   Labs:   All laboratory work was reviewed including any pertinent negatives or positives listed below:  Labs Reviewed  BASIC METABOLIC PANEL - Abnormal; Notable for the following:    Potassium 3.4 (*)    Glucose, Bld 115 (*)    All other components within normal limits  URINALYSIS COMPLETEWITH MICROSCOPIC ([facility] ONLY) - Abnormal; Notable for the following:    Color, Urine YELLOW (*)    APPearance CLEAR (*)    All other components within normal limits  CBC    EKG: ED ECG REPORT I, Jennye Moccasin, the attending physician, personally viewed and interpreted this ECG.  Date: 03/15/2015 EKG Time: 1225 Rate: 82 Rhythm: normal sinus rhythm QRS Axis: normal Intervals: normal ST/T Wave abnormalities: normal Conduction Disutrbances: none Narrative Interpretation: unremarkable    Radiology:   I personally reviewed the radiologic studies   CT HEAD WITHOUT CONTRAST  TECHNIQUE: Contiguous axial images were obtained from the base of the skull through the vertex without intravenous contrast.  COMPARISON: 06/19/2008  FINDINGS: Normal ventricular morphology.  No midline shift or mass effect.  Normal appearance of brain parenchyma.  No intracranial hemorrhage, mass lesion, or acute  infarction.  Visualized paranasal sinuses and mastoid air cells clear.  Bones unremarkable.    ED Course:  Patient's stay was uneventful he exhibits no focal neurologic deficits. The patient's symptoms seem to be exhaustive and after movement. Patient denies any recent head trauma but has a remote what sounds like concussion. Patient will be prescribed Antivert and Zofran on an outpatient basis. I felt this was unlikely to be multiple sclerosis or posterior circulation issue.  Assessment:  Benign positional vertigo    Final Clinical Impression:  Final diagnoses:  Vertigo     Plan: Patient will be discharged on Antivert and Zofran. He  was referred to ENT and/or to follow up with his primary physician. If his symptoms don't resolve and may require MRI evaluation. He was also advised to get an eye exam with an ophthalmologist or optometrist.            Jennye Moccasin, MD 03/15/15 [phone]

## 2015-11-12 ENCOUNTER — Emergency Department
Admission: EM | Admit: 2015-11-12 | Discharge: 2015-11-12 | Disposition: A | Discharge status: Home / Self Care | Payer: Self-pay | Attending: Emergency Medicine | Admitting: Emergency Medicine

## 2015-11-12 ENCOUNTER — Emergency Department: Payer: Self-pay

## 2015-11-12 DIAGNOSIS — Z7952 Long term (current) use of systemic steroids: Secondary | ICD-10-CM | POA: Insufficient documentation

## 2015-11-12 DIAGNOSIS — X501XXA Overexertion from prolonged static or awkward postures, initial encounter: Secondary | ICD-10-CM | POA: Insufficient documentation

## 2015-11-12 DIAGNOSIS — Y929 Unspecified place or not applicable: Secondary | ICD-10-CM | POA: Insufficient documentation

## 2015-11-12 DIAGNOSIS — Z792 Long term (current) use of antibiotics: Secondary | ICD-10-CM | POA: Insufficient documentation

## 2015-11-12 DIAGNOSIS — Y999 Unspecified external cause status: Secondary | ICD-10-CM | POA: Insufficient documentation

## 2015-11-12 DIAGNOSIS — Y9339 Activity, other involving climbing, rappelling and jumping off: Secondary | ICD-10-CM | POA: Insufficient documentation

## 2015-11-12 DIAGNOSIS — S93401A Sprain of unspecified ligament of right ankle, initial encounter: Secondary | ICD-10-CM | POA: Insufficient documentation

## 2015-11-12 DIAGNOSIS — F17200 Nicotine dependence, unspecified, uncomplicated: Secondary | ICD-10-CM | POA: Insufficient documentation

## 2015-11-12 MED ORDER — IBUPROFEN 800 MG PO TABS
800.0000 mg | ORAL_TABLET | Freq: Once | ORAL | Status: AC
  Administered 2015-11-12: 800 mg via ORAL
  Filled 2015-11-12: qty 1

## 2015-11-12 MED ORDER — IBUPROFEN 800 MG PO TABS: 800.0000 mg | ORAL_TABLET | Freq: Three times a day (TID) | ORAL | Status: DC | PRN

## 2015-11-12 NOTE — ED Notes (Signed)
Patient arrived with family after jumping off trampoline and landing on ankle wrong.  States he heard a pop when he hit the ground. Pulses still present did not lose conciousness

## 2015-11-12 NOTE — Discharge Instructions (Signed)
Acute Ankle Sprain With Phase I Rehab An acute ankle sprain is a partial or complete tear in one or more of the ligaments of the ankle due to traumatic injury. The severity of the injury depends on both the number of ligaments sprained and the grade of sprain. There are 3 grades of sprains.   A grade 1 sprain is a mild sprain. There is a slight pull without obvious tearing. There is no loss of strength, and the muscle and ligament are the correct length.  A grade 2 sprain is a moderate sprain. There is tearing of fibers within the substance of the ligament where it connects two bones or two cartilages. The length of the ligament is increased, and there is usually decreased strength.  A grade 3 sprain is a complete rupture of the ligament and is uncommon. In addition to the grade of sprain, there are three types of ankle sprains.  Lateral ankle sprains: This is a sprain of one or more of the three ligaments on the outer side (lateral) of the ankle. These are the most common sprains. Medial ankle sprains: There is one large triangular ligament of the inner side (medial) of the ankle that is susceptible to injury. Medial ankle sprains are less common. Syndesmosis, "high ankle," sprains: The syndesmosis is the ligament that connects the two bones of the lower leg. Syndesmosis sprains usually only occur with very severe ankle sprains. SYMPTOMS  Pain, tenderness, and swelling in the ankle, starting at the side of injury that may progress to the whole ankle and foot with time.  "Pop" or tearing sensation at the time of injury.  Bruising that may spread to the heel.  Impaired ability to walk soon after injury. CAUSES   Acute ankle sprains are caused by trauma placed on the ankle that temporarily forces or pries the anklebone (talus) out of its normal socket.  Stretching or tearing of the ligaments that normally hold the joint in place (usually due to a twisting injury). RISK INCREASES  WITH:  Previous ankle sprain.  Sports in which the foot may land awkwardly (i.e., basketball, volleyball, or soccer) or walking or running on uneven or rough surfaces.  Shoes with inadequate support to prevent sideways motion when stress occurs.  Poor strength and flexibility.  Poor balance skills.  Contact sports. PREVENTION   Warm up and stretch properly before activity.  Maintain physical fitness:  Ankle and leg flexibility, muscle strength, and endurance.  Cardiovascular fitness.  Balance training activities.  Use proper technique and have a coach correct improper technique.  Taping, protective strapping, bracing, or high-top tennis shoes may help prevent injury. Initially, tape is best; however, it loses most of its support function within 10 to 15 minutes.  Wear proper-fitted protective shoes (High-top shoes with taping or bracing is more effective than either alone).  Provide the ankle with support during sports and practice activities for 12 months following injury. PROGNOSIS   If treated properly, ankle sprains can be expected to recover completely; however, the length of recovery depends on the degree of injury.  A grade 1 sprain usually heals enough in 5 to 7 days to allow modified activity and requires an average of 6 weeks to heal completely.  A grade 2 sprain requires 6 to 10 weeks to heal completely.  A grade 3 sprain requires 12 to 16 weeks to heal.  A syndesmosis sprain often takes more than 3 months to heal. RELATED COMPLICATIONS   Frequent recurrence of symptoms may  result in a chronic problem. Appropriately addressing the problem the first time decreases the frequency of recurrence and optimizes healing time. Severity of the initial sprain does not predict the likelihood of later instability. °· Injury to other structures (bone, cartilage, or tendon). °· A chronically unstable or arthritic ankle joint is a possibility with repeated  sprains. °TREATMENT °Treatment initially involves the use of ice, medication, and compression bandages to help reduce pain and inflammation. Ankle sprains are usually immobilized in a walking cast or boot to allow for healing. Crutches may be recommended to reduce pressure on the injury. After immobilization, strengthening and stretching exercises may be necessary to regain strength and a full range of motion. Surgery is rarely needed to treat ankle sprains. °MEDICATION  °· Nonsteroidal anti-inflammatory medications, such as aspirin and ibuprofen (do not take for the first 3 days after injury or within 7 days before surgery), or other minor pain relievers, such as acetaminophen, are often recommended. Take these as directed by your caregiver. Contact your caregiver immediately if any bleeding, stomach upset, or signs of an allergic reaction occur from these medications. °· Ointments applied to the skin may be helpful. °· Pain relievers may be prescribed as necessary by your caregiver. Do not take prescription pain medication for longer than 4 to 7 days. Use only as directed and only as much as you need. °HEAT AND COLD °· Cold treatment (icing) is used to relieve pain and reduce inflammation for acute and chronic cases. Cold should be applied for 10 to 15 minutes every 2 to 3 hours for inflammation and pain and immediately after any activity that aggravates your symptoms. Use ice packs or an ice massage. °· Heat treatment may be used before performing stretching and strengthening activities prescribed by your caregiver. Use a heat pack or a warm soak. °SEEK IMMEDIATE MEDICAL CARE IF:  °· Pain, swelling, or bruising worsens despite treatment. °· You experience pain, numbness, discoloration, or coldness in the foot or toes. °· New, unexplained symptoms develop (drugs used in treatment may produce side effects.) °EXERCISES  °PHASE I EXERCISES °RANGE OF MOTION (ROM) AND STRETCHING EXERCISES - Ankle Sprain, Acute Phase I,  Weeks 1 to 2 °These exercises may help you when beginning to restore flexibility in your ankle. You will likely work on these exercises for the 1 to 2 weeks after your injury. Once your physician, physical therapist, or athletic trainer sees adequate progress, he or she will advance your exercises. While completing these exercises, remember:  °· Restoring tissue flexibility helps normal motion to return to the joints. This allows healthier, less painful movement and activity. °· An effective stretch should be held for at least 30 seconds. °· A stretch should never be painful. You should only feel a gentle lengthening or release in the stretched tissue. °RANGE OF MOTION - Dorsi/Plantar Flexion °· While sitting with your right / left knee straight, draw the top of your foot upwards by flexing your ankle. Then reverse the motion, pointing your toes downward. °· Hold each position for __________ seconds. °· After completing your first set of exercises, repeat this exercise with your knee bent. °Repeat __________ times. Complete this exercise __________ times per day.  °RANGE OF MOTION - Ankle Alphabet °· Imagine your right / left big toe is a pen. °· Keeping your hip and knee still, write out the entire alphabet with your "pen." Make the letters as large as you can without increasing any discomfort. °Repeat __________ times. Complete this exercise __________   times per day.  °STRENGTHENING EXERCISES - Ankle Sprain, Acute -Phase I, Weeks 1 to 2 °These exercises may help you when beginning to restore strength in your ankle. You will likely work on these exercises for 1 to 2 weeks after your injury. Once your physician, physical therapist, or athletic trainer sees adequate progress, he or she will advance your exercises. While completing these exercises, remember:  °· Muscles can gain both the endurance and the strength needed for everyday activities through controlled exercises. °· Complete these exercises as instructed by  your physician, physical therapist, or athletic trainer. Progress the resistance and repetitions only as guided. °· You may experience muscle soreness or fatigue, but the pain or discomfort you are trying to eliminate should never worsen during these exercises. If this pain does worsen, stop and make certain you are following the directions exactly. If the pain is still present after adjustments, discontinue the exercise until you can discuss the trouble with your clinician. °STRENGTH - Dorsiflexors °· Secure a rubber exercise band/tubing to a fixed object (i.e., table, pole) and loop the other end around your right / left foot. °· Sit on the floor facing the fixed object. The band/tubing should be slightly tense when your foot is relaxed. °· Slowly draw your foot back toward you using your ankle and toes. °· Hold this position for __________ seconds. Slowly release the tension in the band and return your foot to the starting position. °Repeat __________ times. Complete this exercise __________ times per day.  °STRENGTH - Plantar-flexors  °· Sit with your right / left leg extended. Holding onto both ends of a rubber exercise band/tubing, loop it around the ball of your foot. Keep a slight tension in the band. °· Slowly push your toes away from you, pointing them downward. °· Hold this position for __________ seconds. Return slowly, controlling the tension in the band/tubing. °Repeat __________ times. Complete this exercise __________ times per day.  °STRENGTH - Ankle Eversion °· Secure one end of a rubber exercise band/tubing to a fixed object (table, pole). Loop the other end around your foot just before your toes. °· Place your fists between your knees. This will focus your strengthening at your ankle. °· Drawing the band/tubing across your opposite foot, slowly, pull your little toe out and up. Make sure the band/tubing is positioned to resist the entire motion. °· Hold this position for __________ seconds. °Have  your muscles resist the band/tubing as it slowly pulls your foot back to the starting position.  °Repeat __________ times. Complete this exercise __________ times per day.  °STRENGTH - Ankle Inversion °· Secure one end of a rubber exercise band/tubing to a fixed object (table, pole). Loop the other end around your foot just before your toes. °· Place your fists between your knees. This will focus your strengthening at your ankle. °· Slowly, pull your big toe up and in, making sure the band/tubing is positioned to resist the entire motion. °· Hold this position for __________ seconds. °· Have your muscles resist the band/tubing as it slowly pulls your foot back to the starting position. °Repeat __________ times. Complete this exercises __________ times per day.  °STRENGTH - Towel Curls °· Sit in a chair positioned on a non-carpeted surface. °· Place your right / left foot on a towel, keeping your heel on the floor. °· Pull the towel toward your heel by only curling your toes. Keep your heel on the floor. °· If instructed by your physician, physical therapist,   or athletic trainer, add weight to the end of the towel. Repeat __________ times. Complete this exercise __________ times per day.   This information is not intended to replace advice given to you by your health care provider. Make sure you discuss any questions you have with your health care provider.   Document Released: 02/07/2005 Document Revised: 07/30/2014 Document Reviewed: 10/21/2008 Elsevier Interactive Patient Education 2016 [place].  Cryotherapy Cryotherapy means treatment with cold. Ice or gel packs can be used to reduce both pain and swelling. Ice is the most helpful within the first 24 to 48 hours after an injury or flare-up from overusing a muscle or joint. Sprains, strains, spasms, burning pain, shooting pain, and aches can all be eased with ice. Ice can also be used when recovering from surgery. Ice is effective, has very few side  effects, and is safe for most people to use. PRECAUTIONS  Ice is not a safe treatment option for people with:  Raynaud phenomenon. This is a condition affecting small blood vessels in the extremities. Exposure to cold may cause your problems to return.  Cold hypersensitivity. There are many forms of cold hypersensitivity, including:  Cold urticaria. Red, itchy hives appear on the skin when the tissues begin to warm after being iced.  Cold erythema. This is a red, itchy rash caused by exposure to cold.  Cold hemoglobinuria. Red blood cells break down when the tissues begin to warm after being iced. The hemoglobin that carry oxygen are passed into the urine because they cannot combine with blood proteins fast enough.  Numbness or altered sensitivity in the area being iced. If you have any of the following conditions, do not use ice until you have discussed cryotherapy with your caregiver:  Heart conditions, such as arrhythmia, angina, or chronic heart disease.  High blood pressure.  Healing wounds or open skin in the area being iced.  Current infections.  Rheumatoid arthritis.  Poor circulation.  Diabetes. Ice slows the blood flow in the region it is applied. This is beneficial when trying to stop inflamed tissues from spreading irritating chemicals to surrounding tissues. However, if you expose your skin to cold temperatures for too long or without the proper protection, you can damage your skin or nerves. Watch for signs of skin damage due to cold. HOME CARE INSTRUCTIONS Follow these tips to use ice and cold packs safely.  Place a dry or damp towel between the ice and skin. A damp towel will cool the skin more quickly, so you may need to shorten the time that the ice is used.  For a more rapid response, add gentle compression to the ice.  Ice for no more than 10 to 20 minutes at a time. The bonier the area you are icing, the less time it will take to get the benefits of  ice.  Check your skin after 5 minutes to make sure there are no signs of a poor response to cold or skin damage.  Rest 20 minutes or more between uses.  Once your skin is numb, you can end your treatment. You can test numbness by very lightly touching your skin. The touch should be so light that you do not see the skin dimple from the pressure of your fingertip. When using ice, most people will feel these normal sensations in this order: cold, burning, aching, and numbness.  Do not use ice on someone who cannot communicate their responses to pain, such as small children or people with dementia.  HOW TO MAKE AN ICE PACK °Ice packs are the most common way to use ice therapy. Other methods include ice massage, ice baths, and cryosprays. Muscle creams that cause a cold, tingly feeling do not offer the same benefits that ice offers and should not be used as a substitute unless recommended by your caregiver. °To make an ice pack, do one of the following: °· Place crushed ice or a bag of frozen vegetables in a sealable plastic bag. Squeeze out the excess air. Place this bag inside another plastic bag. Slide the bag into a pillowcase or place a damp towel between your skin and the bag. °· Mix 3 parts water with 1 part rubbing alcohol. Freeze the mixture in a sealable plastic bag. When you remove the mixture from the freezer, it will be slushy. Squeeze out the excess air. Place this bag inside another plastic bag. Slide the bag into a pillowcase or place a damp towel between your skin and the bag. °SEEK MEDICAL CARE IF: °· You develop white spots on your skin. This may give the skin a blotchy (mottled) appearance. °· Your skin turns blue or pale. °· Your skin becomes waxy or hard. °· Your swelling gets worse. °MAKE SURE YOU:  °· Understand these instructions. °· Will watch your condition. °· Will get help right away if you are not doing well or get worse. °  °This information is not intended to replace advice given  to you by your health care provider. Make sure you discuss any questions you have with your health care provider. °  °Document Released: 03/05/2011 Document Revised: 07/30/2014 Document Reviewed: 03/05/2011 °Elsevier Interactive Patient Education ©2016 [employer] ° °

## 2015-11-12 NOTE — ED Provider Notes (Signed)
CSN: [id number]     Arrival date & time 11/12/15  2114 History   First MD Initiated Contact with Patient 11/12/15 2232     Chief Complaint  Patient presents with  . Ankle Injury     (Consider location/radiation/quality/duration/timing/severity/associated sxs/prior Treatment) HPI  30 year old male presents of her department for evaluation of right ankle pain. Patient was at a trampoline part, landed wrong and felt a pop along the anterior aspect of his ankle. He is unable to bear weight. Pain is 8 out of 10. Has some medial and anterior ankle pain. He has not had any medications for pain. No numbness or tea. No other injuries to his body.  History reviewed. No pertinent past medical history. History reviewed. No pertinent past surgical history. History reviewed. No pertinent family history. Social History  Substance Use Topics  . Smoking status: Current Every Day Smoker -- 1.00 packs/day for 8 years  . Smokeless tobacco: None  . Alcohol Use: Yes     Comment: 1-2 beer/daily    Review of Systems  Constitutional: Negative.   Cardiovascular: Negative for chest pain and leg swelling.  Gastrointestinal: Negative for abdominal pain.  Musculoskeletal: Positive for joint swelling and gait problem. Negative for back pain and neck pain.  Skin: Negative for color change, rash and wound.  Neurological: Negative for dizziness, syncope and weakness.  Psychiatric/Behavioral: Negative for hallucinations and confusion.  All other systems reviewed and are negative.     Allergies  Review of patient's allergies indicates no known allergies.  Home Medications   Prior to Admission medications   Medication Sig Start Date End Date Taking? Authorizing Provider  albuterol (PROVENTIL HFA;VENTOLIN HFA) 108 (90 BASE) MCG/ACT inhaler Inhale 2 puffs into the lungs every 6 (six) hours as needed for wheezing or shortness of breath.    Historical Provider, MD  albuterol (PROVENTIL HFA;VENTOLIN HFA) 108 (90  BASE) MCG/ACT inhaler Inhale 1-2 puffs into the lungs every 6 (six) hours as needed for wheezing or shortness of breath. 05/05/14   Marc MochaBlair Walden, MD  azithromycin (ZITHROMAX Z-PAK) 250 MG tablet 2 po day one, then 1 daily x 4 days 05/05/14   Marc MochaBlair Walden, MD  ibuprofen (ADVIL,MOTRIN) 800 MG tablet Take 1 tablet (800 mg total) by mouth every 8 (eight) hours as needed. 11/12/15   Evon Slackhomas C Anastassia Noack, PA-C  meclizine (ANTIVERT) 12.5 MG tablet Take 1 tablet (12.5 mg total) by mouth 3 (three) times daily as needed for dizziness or nausea. 03/15/15   Marc MoccasinBrian S Quigley, MD  ondansetron (ZOFRAN) 4 MG tablet Take 1 tablet (4 mg total) by mouth every 8 (eight) hours as needed for nausea or vomiting. 03/15/15   Marc MoccasinBrian S Quigley, MD  predniSONE (DELTASONE) 20 MG tablet 2 tabs po daily x 4 days 05/05/14   Marc MochaBlair Walden, MD   BP 134/78 mmHg  Pulse 102  Temp(Src) 97.9 F (36.6 C) (Oral)  Resp 18  Ht 5\' 11"  (1.803 m)  Wt 106.142 kg  BMI 32.65 kg/m2  SpO2 97% Physical Exam  Constitutional: He is oriented to person, place, and time. He appears well-developed and well-nourished.  HENT:  Head: Normocephalic and atraumatic.  Eyes: Conjunctivae and EOM are normal. Pupils are equal, round, and reactive to light.  Neck: Normal range of motion. Neck supple.  Cardiovascular: Normal rate, regular rhythm, normal heart sounds and intact distal pulses.   Pulmonary/Chest: Effort normal and breath sounds normal. No respiratory distress. He has no wheezes. He has no rales. He exhibits no tenderness.  Abdominal: Soft. Bowel sounds are normal. He exhibits no distension. There is no tenderness.  Musculoskeletal:  Examination of the right ankle shows patient is nontender along the medial lateral malleolus. There is tenderness along the right ankle and ultimately ligament and anterior joint line of the tibiotalar joint. Achilles is intact. Patient severely tender to palpation throughout the anterior tibiotalar joint. Unable tolerate  stress testing. 2+ dorsalis pedis pulse.  Neurological: He is alert and oriented to person, place, and time.  Skin: Skin is warm and dry.  Psychiatric: He has a normal mood and affect. His behavior is normal. Judgment and thought content normal.    ED Course  Procedures (including critical care time) Labs Review Labs Reviewed - No data to display  Imaging Review Dg Ankle Complete Right  11/12/2015  CLINICAL DATA:  Right ankle pain and swelling after trampoline injury. EXAM: RIGHT ANKLE - COMPLETE 3+ VIEW COMPARISON:  None. FINDINGS: There is soft tissue swelling anteriorly in the right ankle. There is a suggestion of tiny nondisplaced avulsion fracture fragments at the anterior distal right tibia and anterior dorsal right talus, seen on the lateral view. No additional fracture. No subluxation. No focal osseous lesion. Tiny Achilles right calcaneal spur. IMPRESSION: Anterior right ankle soft tissue swelling with probable tiny nondisplaced avulsion fracture fragments at the anterior distal right tibia and anterior dorsal right talus. Electronically Signed   By: Delbert Phenix M.D.   On: 11/12/2015 22:08   I have personally reviewed and evaluated these images and lab results as part of my medical decision-making.   EKG Interpretation None      MDM   Final diagnoses:  Ankle sprain, right, initial encounter    29 year old male with right ankle sprain and avulsion. Patient given crutches. Aircast given. He'll follow-up with orthopedics in 5-7 days if no improvement. Over the next 3-4 days he'll rest ice and elevate. Ibuprofen 800 mg every 6 hours as needed.    Evon Slack, PA-C 11/12/15 2246  Marc Semen, MD 11/12/15 [phone]

## 2015-11-12 NOTE — ED Notes (Signed)
Patient reports that he jumped and landed on his right ankle wrong.

## 2016-03-01 ENCOUNTER — Emergency Department: Payer: Self-pay

## 2016-03-01 ENCOUNTER — Encounter: Payer: Self-pay | Admitting: Emergency Medicine

## 2016-03-01 DIAGNOSIS — S61411A Laceration without foreign body of right hand, initial encounter: Secondary | ICD-10-CM | POA: Insufficient documentation

## 2016-03-01 DIAGNOSIS — Z7952 Long term (current) use of systemic steroids: Secondary | ICD-10-CM | POA: Insufficient documentation

## 2016-03-01 DIAGNOSIS — Z23 Encounter for immunization: Secondary | ICD-10-CM | POA: Insufficient documentation

## 2016-03-01 DIAGNOSIS — Z79899 Other long term (current) drug therapy: Secondary | ICD-10-CM | POA: Insufficient documentation

## 2016-03-01 DIAGNOSIS — S62306D Unspecified fracture of fifth metacarpal bone, right hand, subsequent encounter for fracture with routine healing: Secondary | ICD-10-CM | POA: Insufficient documentation

## 2016-03-01 DIAGNOSIS — Z791 Long term (current) use of non-steroidal anti-inflammatories (NSAID): Secondary | ICD-10-CM | POA: Insufficient documentation

## 2016-03-01 DIAGNOSIS — W25XXXA Contact with sharp glass, initial encounter: Secondary | ICD-10-CM | POA: Insufficient documentation

## 2016-03-01 DIAGNOSIS — Y999 Unspecified external cause status: Secondary | ICD-10-CM | POA: Insufficient documentation

## 2016-03-01 DIAGNOSIS — Z792 Long term (current) use of antibiotics: Secondary | ICD-10-CM | POA: Insufficient documentation

## 2016-03-01 DIAGNOSIS — Y939 Activity, unspecified: Secondary | ICD-10-CM | POA: Insufficient documentation

## 2016-03-01 DIAGNOSIS — Y929 Unspecified place or not applicable: Secondary | ICD-10-CM | POA: Insufficient documentation

## 2016-03-01 DIAGNOSIS — F17200 Nicotine dependence, unspecified, uncomplicated: Secondary | ICD-10-CM | POA: Insufficient documentation

## 2016-03-02 ENCOUNTER — Emergency Department
Admission: EM | Admit: 2016-03-02 | Discharge: 2016-03-02 | Disposition: A | Discharge status: Home / Self Care | Payer: Self-pay | Attending: Emergency Medicine | Admitting: Emergency Medicine

## 2016-03-02 DIAGNOSIS — IMO0002 Reserved for concepts with insufficient information to code with codable children: Secondary | ICD-10-CM

## 2016-03-02 DIAGNOSIS — S62309D Unspecified fracture of unspecified metacarpal bone, subsequent encounter for fracture with routine healing: Secondary | ICD-10-CM

## 2016-03-02 DIAGNOSIS — W19XXXA Unspecified fall, initial encounter: Secondary | ICD-10-CM

## 2016-03-02 MED ORDER — LIDOCAINE-EPINEPHRINE (PF) 1 %-1:200000 IJ SOLN
30.0000 mL | Freq: Once | INTRAMUSCULAR | Status: AC
  Administered 2016-03-02: 30 mL
  Filled 2016-03-02: qty 30

## 2016-03-02 MED ORDER — TETANUS-DIPHTH-ACELL PERTUSSIS 5-2.5-18.5 LF-MCG/0.5 IM SUSP
0.5000 mL | Freq: Once | INTRAMUSCULAR | Status: AC
Start: 2016-03-02 — End: 2016-03-02
  Administered 2016-03-02: 0.5 mL via INTRAMUSCULAR
  Filled 2016-03-02: qty 0.5

## 2016-03-02 NOTE — ED Notes (Signed)
Pt reports he cut his hand on a piece of glass at approx 2100 on 8/10. Pt reports he took 2 200mg  ibuprofen at that time

## 2016-03-02 NOTE — ED Notes (Signed)
Reviewed d/c instructions, follow-up care, suture care with pt. Pt verbalized understanding

## 2016-03-02 NOTE — ED Provider Notes (Signed)
[facility] Provider Note        Time seen: ----------------------------------------- 1:56 AM on 03/02/2016 -----------------------------------------    I have reviewed the triage vital signs and the nursing notes.   HISTORY  Chief Complaint Laceration    HPI Marc Henderson is a 30 y.o. male who presents to the ER after sustaining an injury to his right hand. Patient states he was walking outside, his wife distracted him and he fell breaking glass with his right hand. He sustained a laceration over the right hand. He is also complaining of hand pain and some scattered numbness in the hand and right forearm. He denies other complaints, is unsure of his tetanus status. Event occurred this evening. Patient states he punched a storm glass door approximately a month ago and is sure that he sustained a fracture in his hand. Patient does not admit to drinking alcohol.   No past medical history on file.  There are no active problems to display for this patient.   No past surgical history on file.  Allergies Review of patient's allergies indicates no known allergies.  Social History Social History  Substance Use Topics  . Smoking status: Current Every Day Smoker    Packs/day: 1.00    Years: 8.00  . Smokeless tobacco: Not on file  . Alcohol use Yes     Comment: 1-2 beer/daily    Review of Systems Constitutional: Negative for fever. Musculoskeletal: Positive for right hand pain Skin: Positive for right hand laceration Neurological: Negative for headaches, positive for right hand paresthesia ____________________________________________   PHYSICAL EXAM:  VITAL SIGNS: ED Triage Vitals  Enc Vitals Group     BP 03/01/16 2303 (!) 144/68     Pulse Rate 03/01/16 2303 98     Resp 03/01/16 2303 18     Temp 03/01/16 2303 98.2 F (36.8 C)     Temp Source 03/01/16 2303 Oral     SpO2 03/01/16 2303 96 %     Weight 03/01/16 2303 230  lb (104.3 kg)     Height 03/01/16 2303  (1.803 m)     Head Circumference --      Peak Flow --      Pain Score 03/01/16 2309 0     Pain Loc --      Pain Edu? --      Excl. in GC? --     Constitutional: Alert and oriented. Well appearing and in no distress. Musculoskeletal: Patient sustained laceration over the dorsum medial aspect of the hyperthenar eminence distally. There is approximately 3.5 cm laceration approaching the proximal interphalangeal joint. Neurologic:  Normal speech and language. No gross focal neurologic deficits are appreciated. No sensory or motor deficits. Skin:  3.5 cm laceration noted over the right hand, hyperthenar eminence dorsally Psychiatric: Mood and affect are normal. Speech and behavior are normal.  ____________________________________________  ED COURSE:  Pertinent labs & imaging results that were available during my care of the patient were reviewed by me and considered in my medical decision making (see chart for details). Clinical Course  Patient is no acute distress, we will obtain x-rays, give TDAP. He will require laceration repair.  Marland Kitchen.Laceration Repair Date/Time: 03/02/2016 2:53 AM Performed by: Emily Filbert Authorized by: Daryel November E   Consent:    Consent obtained:  Verbal   Consent given by:  Patient   Risks discussed:  Infection, retained foreign body and pain Anesthesia (see MAR for exact dosages):  Anesthesia method:  Local infiltration   Local anesthetic:  Lidocaine 1% WITH epi Laceration details:    Location:  Hand   Hand location:  R hand, dorsum   Length (cm):  3.5   Depth (mm):  10 Repair type:    Repair type:  Simple Pre-procedure details:    Preparation:  Patient was prepped and draped in usual sterile fashion Exploration:    Hemostasis achieved with:  Epinephrine   Wound exploration: wound explored through full range of motion and entire depth of wound probed and visualized     Contaminated: no    Treatment:    Area cleansed with:  Betadine and saline   Amount of cleaning:  Standard   Irrigation solution:  Sterile saline   Irrigation method:  Syringe   Visualized foreign bodies/material removed: no   Skin repair:    Repair method:  Sutures   Suture size:  4-0   Suture material:  Prolene   Suture technique:  Simple interrupted   Number of sutures:  8 Approximation:    Approximation:  Close   Vermilion border: well-aligned   Post-procedure details:    Dressing:  Antibiotic ointment and sterile dressing   Patient tolerance of procedure:  Tolerated well, no immediate complications   ____________________________________________   RADIOLOGY Images were viewed by me  IMPRESSION: Mildly comminuted fracture involving the distal aspect of the fifth metacarpal, likely reflecting the patient's prior fall. Overlying laceration noted. No radiopaque foreign bodies seen.   ____________________________________________  FINAL ASSESSMENT AND PLAN  Remote Boxer's fracture, right hand laceration  Plan: Patient with imaging as dictated above. Patient with right hand laceration repair. Good cosmetic result. Advise suture removal in 10-14 days. Topical antibiotic ointment. Boxer's fracture was not related to the injury today.   Emily FilbertWilliams, Nadezhda Pollitt E, MD   Note: This dictation was prepared with Dragon dictation. Any transcriptional errors that result from this process are unintentional    Emily FilbertJonathan E Einer Meals, MD 03/02/16 [phone]

## 2016-03-02 NOTE — ED Notes (Signed)
Pt to triage with saturated dressing to right hand; dressing removed & bleeding noted to an apparent avulsion to top of of hand, pinkie side; bleeding continues despite pressure held; surgicele applied with gauze dressing; charge nurse aware

## 2017-04-07 DIAGNOSIS — Z760 Encounter for issue of repeat prescription: Secondary | ICD-10-CM | POA: Diagnosis not present

## 2017-04-07 DIAGNOSIS — R030 Elevated blood-pressure reading, without diagnosis of hypertension: Secondary | ICD-10-CM | POA: Diagnosis not present

## 2017-04-24 ENCOUNTER — Encounter: Payer: Self-pay | Admitting: Emergency Medicine

## 2017-04-24 ENCOUNTER — Ambulatory Visit (INDEPENDENT_AMBULATORY_CARE_PROVIDER_SITE_OTHER): Payer: BLUE CROSS/BLUE SHIELD | Admitting: Emergency Medicine

## 2017-04-24 VITALS — BP 100/60 | HR 78 | Temp 98.3°F | Resp 16 | Ht 71.0 in | Wt 240.8 lb

## 2017-04-24 DIAGNOSIS — Z23 Encounter for immunization: Secondary | ICD-10-CM | POA: Insufficient documentation

## 2017-04-24 DIAGNOSIS — J4541 Moderate persistent asthma with (acute) exacerbation: Secondary | ICD-10-CM | POA: Insufficient documentation

## 2017-04-24 DIAGNOSIS — J4540 Moderate persistent asthma, uncomplicated: Secondary | ICD-10-CM | POA: Diagnosis not present

## 2017-04-24 MED ORDER — BECLOMETHASONE DIPROPIONATE 40 MCG/ACT IN AERS: 2.0000 | INHALATION_SPRAY | Freq: Two times a day (BID) | RESPIRATORY_TRACT | 12 refills | Status: DC

## 2017-04-24 MED ORDER — BECLOMETHASONE DIPROP HFA 40 MCG/ACT IN AERB: 2.0000 | INHALATION_SPRAY | Freq: Two times a day (BID) | RESPIRATORY_TRACT | 5 refills | Status: DC

## 2017-04-24 MED ORDER — ALBUTEROL SULFATE HFA 108 (90 BASE) MCG/ACT IN AERS: 2.0000 | INHALATION_SPRAY | Freq: Four times a day (QID) | RESPIRATORY_TRACT | 5 refills | Status: DC | PRN

## 2017-04-24 NOTE — Assessment & Plan Note (Signed)
Asthma not controlled at all. Pt needs to be started on chronic inhaled corticosteroid therapy and use Albuterol just as a rescue inhaler. I explained this well to the patient. Will follow up in 3 months.

## 2017-04-24 NOTE — Progress Notes (Signed)
Marc Henderson 31 y.o.   Chief Complaint  Patient presents with  . Establish Care    patient has HX of ASTHMA  . Medication Refill    Albuterol inhaler-PROAIR HFA    HISTORY OF PRESENT ILLNESS: This is a 31 y.o. male with h/o asthma, needs refill of Albuterol MDI; further questoning shows patient's asthma is not well controlled; uses rescue inhaler on a daily basis; used to use Spiriva but hasn't used it in a while due to cost issues. States asthma was well controlled then.  Asthma  He complains of difficulty breathing, shortness of breath and wheezing (intermittent). There is no cough, hemoptysis or sputum production. This is a recurrent problem. The current episode started more than 1 year ago. The problem occurs intermittently. The problem has been waxing and waning. Pertinent negatives include no chest pain, dyspnea on exertion, fever, headaches, heartburn, myalgias, nasal congestion, rhinorrhea, sore throat, trouble swallowing or weight loss. His symptoms are alleviated by beta-agonist. He reports moderate improvement on treatment. Risk factors for lung disease include smoking/tobacco exposure. His past medical history is significant for asthma.     Prior to Admission medications   Medication Sig Start Date End Date Taking? Authorizing Provider  ibuprofen (ADVIL,MOTRIN) 800 MG tablet Take 1 tablet (800 mg total) by mouth every 8 (eight) hours as needed. 11/12/15  Yes Evon Slack, PA-C  albuterol (PROVENTIL HFA;VENTOLIN HFA) 108 (90 BASE) MCG/ACT inhaler Inhale 2 puffs into the lungs every 6 (six) hours as needed for wheezing or shortness of breath.    [provider]  albuterol (PROVENTIL HFA;VENTOLIN HFA) 108 (90 BASE) MCG/ACT inhaler Inhale 1-2 puffs into the lungs every 6 (six) hours as needed for wheezing or shortness of breath. Patient not taking: Reported on 04/24/2017 05/05/14   Elwin Mocha, MD  meclizine (ANTIVERT) 12.5 MG tablet Take 1 tablet (12.5 mg total)  by mouth 3 (three) times daily as needed for dizziness or nausea. Patient not taking: Reported on 04/24/2017 03/15/15   Jennye Moccasin, MD  ondansetron (ZOFRAN) 4 MG tablet Take 1 tablet (4 mg total) by mouth every 8 (eight) hours as needed for nausea or vomiting. Patient not taking: Reported on 04/24/2017 03/15/15   Jennye Moccasin, MD  predniSONE (DELTASONE) 20 MG tablet 2 tabs po daily x 4 days Patient not taking: Reported on 04/24/2017 05/05/14   Elwin Mocha, MD    No Known Allergies  There are no active problems to display for this patient.   Past Medical History:  Diagnosis Date  . Asthma     No past surgical history on file.  Social History   Social History  . Marital status: Married    Spouse name: N/A  . Number of children: N/A  . Years of education: N/A   Occupational History  . Not on file.   Social History Main Topics  . Smoking status: Current Every Day Smoker    Packs/day: 1.00    Years: 8.00  . Smokeless tobacco: Never Used  . Alcohol use Yes     Comment: 1-2 beer/daily  . Drug use: No  . Sexual activity: Not on file   Other Topics Concern  . Not on file   Social History Narrative  . No narrative on file    No family history on file.   Review of Systems  Constitutional: Negative.  Negative for chills, fever and weight loss.  HENT: Negative.  Negative for nosebleeds, rhinorrhea, sinus pain, sore throat and  trouble swallowing.   Eyes: Negative.  Negative for discharge and redness.  Respiratory: Positive for shortness of breath and wheezing (intermittent). Negative for cough, hemoptysis and sputum production.   Cardiovascular: Negative for chest pain, dyspnea on exertion, palpitations and leg swelling.  Gastrointestinal: Negative.  Negative for abdominal pain, diarrhea, heartburn, nausea and vomiting.  Genitourinary: Negative.  Negative for dysuria, flank pain and hematuria.  Musculoskeletal: Negative for myalgias and neck pain.  Skin: Negative.   Negative for rash.  Neurological: Negative.  Negative for dizziness and headaches.  Endo/Heme/Allergies: Negative.   All other systems reviewed and are negative.  Vitals:   04/24/17 1023  BP: 100/60  Pulse: 78  Resp: 16  Temp: 98.3 F (36.8 C)  SpO2: 99%     Physical Exam  Constitutional: He is oriented to person, place, and time. He appears well-developed and well-nourished.  HENT:  Head: Normocephalic and atraumatic.  Eyes: Pupils are equal, round, and reactive to light. EOM are normal.  Neck: Normal range of motion. Neck supple. No JVD present. No thyromegaly present.  Cardiovascular: Normal rate, regular rhythm, normal heart sounds and intact distal pulses.   Pulmonary/Chest: Effort normal and breath sounds normal.  Abdominal: Soft. Bowel sounds are normal. He exhibits no distension. There is no tenderness.  Musculoskeletal: Normal range of motion.  Lymphadenopathy:    He has no cervical adenopathy.  Neurological: He is alert and oriented to person, place, and time. He displays normal reflexes. No sensory deficit. He exhibits normal muscle tone.  Skin: Skin is warm and dry. Capillary refill takes less than 2 seconds. No rash noted.  Psychiatric: He has a normal mood and affect. His behavior is normal.  Vitals reviewed.  Moderate persistent asthma without complication Asthma not controlled at all. Pt needs to be started on chronic inhaled corticosteroid therapy and use Albuterol just as a rescue inhaler. I explained this well to the patient. Will follow up in 3 months.    ASSESSMENT & PLAN:  Marc Henderson was seen today for establish care and medication refill.  Diagnoses and all orders for this visit:  Moderate persistent asthma without complication -     albuterol (PROVENTIL HFA;VENTOLIN HFA) 108 (90 Base) MCG/ACT inhaler; Inhale 2 puffs into the lungs every 6 (six) hours as needed for wheezing or shortness of breath. -     beclomethasone (QVAR) 40 MCG/ACT inhaler; Inhale  2 puffs into the lungs 2 (two) times daily.  Need for prophylactic vaccination and inoculation against influenza -     Flu Vaccine QUAD 36+ mos IM  Other orders -     beclomethasone (QVAR REDIHALER) 40 MCG/ACT inhaler; Inhale 2 puffs into the lungs 2 (two) times daily.   Marc Henderson was seen today for establish care and medication refill.  Diagnoses and all orders for this visit:  Moderate persistent asthma without complication -     albuterol (PROVENTIL HFA;VENTOLIN HFA) 108 (90 Base) MCG/ACT inhaler; Inhale 2 puffs into the lungs every 6 (six) hours as needed for wheezing or shortness of breath. -     beclomethasone (QVAR) 40 MCG/ACT inhaler; Inhale 2 puffs into the lungs 2 (two) times daily.  Need for prophylactic vaccination and inoculation against influenza -     Flu Vaccine QUAD 36+ mos IM  Other orders -     beclomethasone (QVAR REDIHALER) 40 MCG/ACT inhaler; Inhale 2 puffs into the lungs 2 (two) times daily.      Patient Instructions       IF you  received an x-ray today, you will receive an invoice from [facility]. Please contact [facility] at [phone] with questions or concerns regarding your invoice.   IF you received labwork today, you will receive an invoice from [facility]. Please contact [facility] at [phone] with questions or concerns regarding your invoice.   Our billing staff will not be able to assist you with questions regarding bills from these companies.  You will be contacted with the lab results as soon as they are available. The fastest way to get your results is to activate your My Chart account. Instructions are located on the last page of this paperwork. If you have not heard from [place] regarding the results in 2 weeks, please contact this office.     Asthma, Adult Asthma is a condition of the lungs in which the airways tighten and narrow. Asthma can make it hard to breathe. Asthma cannot be cured, but medicine and lifestyle  changes can help control it. Asthma may be started (triggered) by:  Animal skin flakes (dander).  Dust.  Cockroaches.  Pollen.  Mold.  Smoke.  Cleaning products.  Hair sprays or aerosol sprays.  Paint fumes or strong smells.  Cold air, weather changes, and winds.  Crying or laughing hard.  Stress.  Certain medicines or drugs.  Foods, such as dried fruit, potato chips, and sparkling grape juice.  Infections or conditions (colds, flu).  Exercise.  Certain medical conditions or diseases.  Exercise or tiring activities.  Follow these instructions at home:  Take medicine as told by your doctor.  Use a peak flow meter as told by your doctor. A peak flow meter is a tool that measures how well the lungs are working.  Record and keep track of the peak flow meter's readings.  Understand and use the asthma action plan. An asthma action plan is a written plan for taking care of your asthma and treating your attacks.  To help prevent asthma attacks: ? Do not smoke. Stay away from secondhand smoke. ? Change your heating and air conditioning filter often. ? Limit your use of fireplaces and wood stoves. ? Get rid of pests (such as roaches and mice) and their droppings. ? Throw away plants if you see mold on them. ? Clean your floors. Dust regularly. Use cleaning products that do not smell. ? Have someone vacuum when you are not home. Use a vacuum cleaner with a HEPA filter if possible. ? Replace carpet with wood, tile, or vinyl flooring. Carpet can trap animal skin flakes and dust. ? Use allergy-proof pillows, mattress covers, and box spring covers. ? Wash bed sheets and blankets every week in hot water and dry them in a dryer. ? Use blankets that are made of polyester or cotton. ? Clean bathrooms and kitchens with bleach. If possible, have someone repaint the walls in these rooms with mold-resistant paint. Keep out of the rooms that are being cleaned and painted. ? Wash  hands often. Contact a doctor if:  You have make a whistling sound when breaking (wheeze), have shortness of breath, or have a cough even if taking medicine to prevent attacks.  The colored mucus you cough up (sputum) is thicker than usual.  The colored mucus you cough up changes from clear or white to yellow, green, gray, or bloody.  You have problems from the medicine you are taking such as: ? A rash. ? Itching. ? Swelling. ? Trouble breathing.  You need reliever medicines more than 2-3 times a week.  Your peak flow measurement is still at 50-79% of your personal best after following the action plan for 1 hour.  You have a fever. Get help right away if:  You seem to be worse and are not responding to medicine during an asthma attack.  You are short of breath even at rest.  You get short of breath when doing very little activity.  You have trouble eating, drinking, or talking.  You have chest pain.  You have a fast heartbeat.  Your lips or fingernails start to turn blue.  You are light-headed, dizzy, or faint.  Your peak flow is less than 50% of your personal best. This information is not intended to replace advice given to you by your health care provider. Make sure you discuss any questions you have with your health care provider. Document Released: 12/26/2007 Document Revised: 12/15/2015 Document Reviewed: 02/05/2013 Elsevier Interactive Patient Education  2017 [employer]      Edwina Barth, MD [facility]

## 2017-04-24 NOTE — Patient Instructions (Addendum)
   IF you received an x-ray today, you will receive an invoice from [facility]. Please contact [facility] at [phone] with questions or concerns regarding your invoice.   IF you received labwork today, you will receive an invoice from [facility]. Please contact [facility] at [phone] with questions or concerns regarding your invoice.   Our billing staff will not be able to assist you with questions regarding bills from these companies.  You will be contacted with the lab results as soon as they are available. The fastest way to get your results is to activate your My Chart account. Instructions are located on the last page of this paperwork. If you have not heard from us regarding the results in 2 weeks, please contact this office.     Asthma, Adult Asthma is a condition of the lungs in which the airways tighten and narrow. Asthma can make it hard to breathe. Asthma cannot be cured, but medicine and lifestyle changes can help control it. Asthma may be started (triggered) by:  Animal skin flakes (dander).  Dust.  Cockroaches.  Pollen.  Mold.  Smoke.  Cleaning products.  Hair sprays or aerosol sprays.  Paint fumes or strong smells.  Cold air, weather changes, and winds.  Crying or laughing hard.  Stress.  Certain medicines or drugs.  Foods, such as dried fruit, potato chips, and sparkling grape juice.  Infections or conditions (colds, flu).  Exercise.  Certain medical conditions or diseases.  Exercise or tiring activities.  Follow these instructions at home:  Take medicine as told by your doctor.  Use a peak flow meter as told by your doctor. A peak flow meter is a tool that measures how well the lungs are working.  Record and keep track of the peak flow meter's readings.  Understand and use the asthma action plan. An asthma action plan is a written plan for taking care of your asthma and treating your attacks.  To help prevent  asthma attacks: ? Do not smoke. Stay away from secondhand smoke. ? Change your heating and air conditioning filter often. ? Limit your use of fireplaces and wood stoves. ? Get rid of pests (such as roaches and mice) and their droppings. ? Throw away plants if you see mold on them. ? Clean your floors. Dust regularly. Use cleaning products that do not smell. ? Have someone vacuum when you are not home. Use a vacuum cleaner with a HEPA filter if possible. ? Replace carpet with wood, tile, or vinyl flooring. Carpet can trap animal skin flakes and dust. ? Use allergy-proof pillows, mattress covers, and box spring covers. ? Wash bed sheets and blankets every week in hot water and dry them in a dryer. ? Use blankets that are made of polyester or cotton. ? Clean bathrooms and kitchens with bleach. If possible, have someone repaint the walls in these rooms with mold-resistant paint. Keep out of the rooms that are being cleaned and painted. ? Wash hands often. Contact a doctor if:  You have make a whistling sound when breaking (wheeze), have shortness of breath, or have a cough even if taking medicine to prevent attacks.  The colored mucus you cough up (sputum) is thicker than usual.  The colored mucus you cough up changes from clear or white to yellow, green, gray, or bloody.  You have problems from the medicine you are taking such as: ? A rash. ? Itching. ? Swelling. ? Trouble breathing.  You need reliever medicines more than   2-3 times a week.  Your peak flow measurement is still at 50-79% of your personal best after following the action plan for 1 hour.  You have a fever. Get help right away if:  You seem to be worse and are not responding to medicine during an asthma attack.  You are short of breath even at rest.  You get short of breath when doing very little activity.  You have trouble eating, drinking, or talking.  You have chest pain.  You have a fast heartbeat.  Your  lips or fingernails start to turn blue.  You are light-headed, dizzy, or faint.  Your peak flow is less than 50% of your personal best. This information is not intended to replace advice given to you by your health care provider. Make sure you discuss any questions you have with your health care provider. Document Released: 12/26/2007 Document Revised: 12/15/2015 Document Reviewed: 02/05/2013 Elsevier Interactive Patient Education  2017 Elsevier Inc.  

## 2017-07-05 ENCOUNTER — Telehealth: Payer: Self-pay

## 2017-07-05 NOTE — Telephone Encounter (Signed)
Copied from CRM #[id number]. Topic: Inquiry >> Jul 05, 2017  4:31 PM Viviann SpareWhite, Selina wrote: Reason for CRM: Patient called because he had not rec'd a bill for his visit on 04/24/17 as a new patient with Dr. Alvy BimlerSagardia. Patient is requesting a call back.

## 2017-08-27 ENCOUNTER — Ambulatory Visit (INDEPENDENT_AMBULATORY_CARE_PROVIDER_SITE_OTHER): Payer: BLUE CROSS/BLUE SHIELD | Admitting: Emergency Medicine

## 2017-08-27 ENCOUNTER — Other Ambulatory Visit: Payer: Self-pay

## 2017-08-27 ENCOUNTER — Encounter: Payer: Self-pay | Admitting: Emergency Medicine

## 2017-08-27 VITALS — BP 108/76 | HR 79 | Temp 97.8°F | Resp 16 | Ht 71.0 in | Wt 230.6 lb

## 2017-08-27 DIAGNOSIS — J4540 Moderate persistent asthma, uncomplicated: Secondary | ICD-10-CM | POA: Diagnosis not present

## 2017-08-27 DIAGNOSIS — M5442 Lumbago with sciatica, left side: Secondary | ICD-10-CM | POA: Diagnosis not present

## 2017-08-27 DIAGNOSIS — M5441 Lumbago with sciatica, right side: Secondary | ICD-10-CM

## 2017-08-27 MED ORDER — DICLOFENAC SODIUM 75 MG PO TBEC: 75.0000 mg | DELAYED_RELEASE_TABLET | Freq: Two times a day (BID) | ORAL | 0 refills | Status: AC

## 2017-08-27 MED ORDER — CYCLOBENZAPRINE HCL 10 MG PO TABS: 10.0000 mg | ORAL_TABLET | Freq: Every day | ORAL | 0 refills | Status: DC

## 2017-08-27 MED ORDER — TIOTROPIUM BROMIDE MONOHYDRATE 18 MCG IN CAPS: 18.0000 ug | ORAL_CAPSULE | Freq: Every day | RESPIRATORY_TRACT | 12 refills | Status: DC

## 2017-08-27 MED ORDER — ALBUTEROL SULFATE HFA 108 (90 BASE) MCG/ACT IN AERS: 2.0000 | INHALATION_SPRAY | Freq: Four times a day (QID) | RESPIRATORY_TRACT | 5 refills | Status: DC | PRN

## 2017-08-27 NOTE — Progress Notes (Signed)
Marc Henderson 32 y.o.   Chief Complaint  Patient presents with  . Back Pain    x 2 months - per patient mid area to RIGHT side and numbness both feet, more LEFT foot  . Medication Refill    Albuterol inhaler    HISTORY OF PRESENT ILLNESS: This is a 32 y.o. male complaining of low back pain that started on and off 2 months ago.  Works with heavy equipment on a daily basis.  At times feels some tingling no numbness to the legs along with pain radiation.  Denies any other significant symptomatology.  Also has a history of asthma and requesting refill for albuterol inhaler.  Has used Qvar before with no significant improvement.  States Spiriva works best for him.  Back Pain  This is a recurrent problem. The current episode started more than 1 month ago. The problem has been waxing and waning since onset. The pain is present in the lumbar spine. The pain radiates to the left knee, right knee, right thigh, left thigh, left foot and right foot. The pain is moderate. The symptoms are aggravated by standing and position. Stiffness is present in the morning. Associated symptoms include leg pain, paresthesias and tingling. Pertinent negatives include no abdominal pain, bladder incontinence, bowel incontinence, chest pain, dysuria, fever, headaches, paresis, pelvic pain, perianal numbness, weakness or weight loss. He has tried heat and NSAIDs for the symptoms. The treatment provided mild relief.     Prior to Admission medications   Medication Sig Start Date End Date Taking? Authorizing Provider  albuterol (PROVENTIL HFA;VENTOLIN HFA) 108 (90 Base) MCG/ACT inhaler Inhale 2 puffs into the lungs every 6 (six) hours as needed for wheezing or shortness of breath. 08/27/17  Yes Hammond Obeirne, Eilleen Kempf, MD  ibuprofen (ADVIL,MOTRIN) 800 MG tablet Take 1 tablet (800 mg total) by mouth every 8 (eight) hours as needed. 11/12/15  Yes Evon Slack, PA-C  beclomethasone (QVAR REDIHALER) 40 MCG/ACT inhaler Inhale 2  puffs into the lungs 2 (two) times daily. Patient not taking: Reported on 08/27/2017 04/24/17   Georgina Quint, MD  beclomethasone (QVAR) 40 MCG/ACT inhaler Inhale 2 puffs into the lungs 2 (two) times daily. Patient not taking: Reported on 08/27/2017 04/24/17   Georgina Quint, MD  cyclobenzaprine (FLEXERIL) 10 MG tablet Take 1 tablet (10 mg total) by mouth at bedtime. 08/27/17   Georgina Quint, MD  diclofenac (VOLTAREN) 75 MG EC tablet Take 1 tablet (75 mg total) by mouth 2 (two) times daily for 5 days. 08/27/17 09/01/17  Georgina Quint, MD  meclizine (ANTIVERT) 12.5 MG tablet Take 1 tablet (12.5 mg total) by mouth 3 (three) times daily as needed for dizziness or nausea. Patient not taking: Reported on 04/24/2017 03/15/15   Jennye Moccasin, MD  ondansetron (ZOFRAN) 4 MG tablet Take 1 tablet (4 mg total) by mouth every 8 (eight) hours as needed for nausea or vomiting. Patient not taking: Reported on 04/24/2017 03/15/15   Jennye Moccasin, MD  predniSONE (DELTASONE) 20 MG tablet 2 tabs po daily x 4 days Patient not taking: Reported on 04/24/2017 05/05/14   Elwin Mocha, MD  tiotropium (SPIRIVA HANDIHALER) 18 MCG inhalation capsule Place 1 capsule (18 mcg total) into inhaler and inhale daily. 08/27/17   Georgina Quint, MD    No Known Allergies  Patient Active Problem List   Diagnosis Date Noted  . Need for prophylactic vaccination and inoculation against influenza 04/24/2017  . Moderate persistent asthma without  complication 04/24/2017    Past Medical History:  Diagnosis Date  . Asthma     No past surgical history on file.  Social History   Socioeconomic History  . Marital status: Married    Spouse name: Not on file  . Number of children: Not on file  . Years of education: Not on file  . Highest education level: Not on file  Social Needs  . Financial resource strain: Not on file  . Food insecurity - worry: Not on file  . Food insecurity - inability: Not on  file  . Transportation needs - medical: Not on file  . Transportation needs - non-medical: Not on file  Occupational History  . Not on file  Tobacco Use  . Smoking status: Current Every Day Smoker    Packs/day: 1.00    Years: 8.00    Pack years: 8.00  . Smokeless tobacco: Never Used  Substance and Sexual Activity  . Alcohol use: Yes    Comment: 1-2 beer/daily  . Drug use: No  . Sexual activity: Not on file  Other Topics Concern  . Not on file  Social History Narrative  . Not on file    No family history on file.   Review of Systems  Constitutional: Negative for fever and weight loss.  HENT: Negative.   Eyes: Negative.   Respiratory: Positive for wheezing. Negative for cough.   Cardiovascular: Negative.  Negative for chest pain, claudication and leg swelling.  Gastrointestinal: Negative for abdominal pain, bowel incontinence, nausea and vomiting.  Genitourinary: Negative.  Negative for bladder incontinence, dysuria, hematuria and pelvic pain.  Musculoskeletal: Positive for back pain.  Skin: Negative.  Negative for rash.  Neurological: Positive for tingling and paresthesias. Negative for dizziness, focal weakness, weakness and headaches.  Endo/Heme/Allergies: Negative.   All other systems reviewed and are negative.   Vitals:   08/27/17 1059  BP: 108/76  Pulse: 79  Resp: 16  Temp: 97.8 F (36.6 C)  SpO2: 97%    Physical Exam  Constitutional: He is oriented to person, place, and time. He appears well-developed and well-nourished.  HENT:  Head: Normocephalic and atraumatic.  Nose: Nose normal.  Mouth/Throat: Oropharynx is clear and moist.  Eyes: Conjunctivae are normal. Pupils are equal, round, and reactive to light.  Neck: Normal range of motion. Neck supple.  Cardiovascular: Normal rate, regular rhythm and normal heart sounds.  Pulmonary/Chest: Effort normal and breath sounds normal.  Abdominal: Soft. Bowel sounds are normal. He exhibits no distension. There  is no tenderness.  Musculoskeletal: Normal range of motion. He exhibits no edema or tenderness.  Lymphadenopathy:    He has no cervical adenopathy.  Neurological: He is alert and oriented to person, place, and time. He displays normal reflexes. No sensory deficit. He exhibits normal muscle tone. Coordination normal.  Skin: Skin is warm and dry. Capillary refill takes less than 2 seconds. No rash noted.  Psychiatric: He has a normal mood and affect. His behavior is normal.  Vitals reviewed.    ASSESSMENT & PLAN: Marc BoomDaniel was seen today for back pain and medication refill.  Diagnoses and all orders for this visit:  Acute bilateral low back pain with bilateral sciatica -     MR Lumbar Spine Wo Contrast; Future  Moderate persistent asthma without complication -     tiotropium (SPIRIVA HANDIHALER) 18 MCG inhalation capsule; Place 1 capsule (18 mcg total) into inhaler and inhale daily. -     Ambulatory referral to Pulmonology -  albuterol (PROVENTIL HFA;VENTOLIN HFA) 108 (90 Base) MCG/ACT inhaler; Inhale 2 puffs into the lungs every 6 (six) hours as needed for wheezing or shortness of breath.  Other orders -     diclofenac (VOLTAREN) 75 MG EC tablet; Take 1 tablet (75 mg total) by mouth 2 (two) times daily for 5 days. -     cyclobenzaprine (FLEXERIL) 10 MG tablet; Take 1 tablet (10 mg total) by mouth at bedtime.    Patient Instructions       IF you received an x-ray today, you will receive an invoice from [facility]. Please contact [facility] at [phone] with questions or concerns regarding your invoice.   IF you received labwork today, you will receive an invoice from [facility]. Please contact [facility] at [phone] with questions or concerns regarding your invoice.   Our billing staff will not be able to assist you with questions regarding bills from these companies.  You will be contacted with the lab results as soon as they are available. The fastest  way to get your results is to activate your My Chart account. Instructions are located on the last page of this paperwork. If you have not heard from [place] regarding the results in 2 weeks, please contact this office.     Back Pain, Adult Back pain is very common. The pain often gets better over time. The cause of back pain is usually not dangerous. Most people can learn to manage their back pain on their own. Follow these instructions at home: Watch your back pain for any changes. The following actions may help to lessen any pain you are feeling:  Stay active. Start with short walks on flat ground if you can. Try to walk farther each day.  Exercise regularly as told by your doctor. Exercise helps your back heal faster. It also helps avoid future injury by keeping your muscles strong and flexible.  Do not sit, drive, or stand in one place for more than 30 minutes.  Do not stay in bed. Resting more than 1-2 days can slow down your recovery.  Be careful when you bend or lift an object. Use good form when lifting: ? Bend at your knees. ? Keep the object close to your body. ? Do not twist.  Sleep on a firm mattress. Lie on your side, and bend your knees. If you lie on your back, put a pillow under your knees.  Take medicines only as told by your doctor.  Put ice on the injured area. ? Put ice in a plastic bag. ? Place a towel between your skin and the bag. ? Leave the ice on for 20 minutes, 2-3 times a day for the first 2-3 days. After that, you can switch between ice and heat packs.  Avoid feeling anxious or stressed. Find good ways to deal with stress, such as exercise.  Maintain a healthy weight. Extra weight puts stress on your back.  Contact a doctor if:  You have pain that does not go away with rest or medicine.  You have worsening pain that goes down into your legs or buttocks.  You have pain that does not get better in one week.  You have pain at night.  You lose  weight.  You have a fever or chills. Get help right away if:  You cannot control when you poop (bowel movement) or pee (urinate).  Your arms or legs feel weak.  Your arms or legs lose feeling (numbness).  You feel sick  to your stomach (nauseous) or throw up (vomit).  You have belly (abdominal) pain.  You feel like you may pass out (faint). This information is not intended to replace advice given to you by your health care provider. Make sure you discuss any questions you have with your health care provider. Document Released: 12/26/2007 Document Revised: 12/15/2015 Document Reviewed: 11/10/2013 Elsevier Interactive Patient Education  2018 Elsevier Inc.      Edwina Barth, MD [facility]

## 2017-08-27 NOTE — Patient Instructions (Addendum)
     IF you received an x-ray today, you will receive an invoice from [facility]. Please contact [facility] at [phone] with questions or concerns regarding your invoice.   IF you received labwork today, you will receive an invoice from [facility]. Please contact [facility] at [phone] with questions or concerns regarding your invoice.   Our billing staff will not be able to assist you with questions regarding bills from these companies.  You will be contacted with the lab results as soon as they are available. The fastest way to get your results is to activate your My Chart account. Instructions are located on the last page of this paperwork. If you have not heard from us regarding the results in 2 weeks, please contact this office.      Back Pain, Adult Back pain is very common. The pain often gets better over time. The cause of back pain is usually not dangerous. Most people can learn to manage their back pain on their own. Follow these instructions at home: Watch your back pain for any changes. The following actions may help to lessen any pain you are feeling:  Stay active. Start with short walks on flat ground if you can. Try to walk farther each day.  Exercise regularly as told by your doctor. Exercise helps your back heal faster. It also helps avoid future injury by keeping your muscles strong and flexible.  Do not sit, drive, or stand in one place for more than 30 minutes.  Do not stay in bed. Resting more than 1-2 days can slow down your recovery.  Be careful when you bend or lift an object. Use good form when lifting: ? Bend at your knees. ? Keep the object close to your body. ? Do not twist.  Sleep on a firm mattress. Lie on your side, and bend your knees. If you lie on your back, put a pillow under your knees.  Take medicines only as told by your doctor.  Put ice on the injured area. ? Put ice in a plastic bag. ? Place a towel between your  skin and the bag. ? Leave the ice on for 20 minutes, 2-3 times a day for the first 2-3 days. After that, you can switch between ice and heat packs.  Avoid feeling anxious or stressed. Find good ways to deal with stress, such as exercise.  Maintain a healthy weight. Extra weight puts stress on your back.  Contact a doctor if:  You have pain that does not go away with rest or medicine.  You have worsening pain that goes down into your legs or buttocks.  You have pain that does not get better in one week.  You have pain at night.  You lose weight.  You have a fever or chills. Get help right away if:  You cannot control when you poop (bowel movement) or pee (urinate).  Your arms or legs feel weak.  Your arms or legs lose feeling (numbness).  You feel sick to your stomach (nauseous) or throw up (vomit).  You have belly (abdominal) pain.  You feel like you may pass out (faint). This information is not intended to replace advice given to you by your health care provider. Make sure you discuss any questions you have with your health care provider. Document Released: 12/26/2007 Document Revised: 12/15/2015 Document Reviewed: 11/10/2013 Elsevier Interactive Patient Education  2018 Elsevier Inc.  

## 2017-09-06 DIAGNOSIS — B001 Herpesviral vesicular dermatitis: Secondary | ICD-10-CM | POA: Diagnosis not present

## 2017-09-09 ENCOUNTER — Other Ambulatory Visit: Payer: Self-pay | Admitting: Emergency Medicine

## 2017-09-09 ENCOUNTER — Telehealth: Payer: Self-pay | Admitting: Emergency Medicine

## 2017-09-09 DIAGNOSIS — M5432 Sciatica, left side: Secondary | ICD-10-CM

## 2017-09-09 DIAGNOSIS — M5431 Sciatica, right side: Secondary | ICD-10-CM

## 2017-09-09 NOTE — Telephone Encounter (Signed)
Needs Ortho evaluation before proceeding. Referral placed to Orthopedics service. Thanks.

## 2017-09-09 NOTE — Telephone Encounter (Signed)
Pt insurance denied his mri auth.. A peer to peer can be done by calling [employer] at [phone].  Thanks

## 2017-10-02 ENCOUNTER — Encounter (HOSPITAL_COMMUNITY): Payer: Self-pay

## 2017-10-02 ENCOUNTER — Emergency Department (HOSPITAL_COMMUNITY): Payer: BLUE CROSS/BLUE SHIELD

## 2017-10-02 ENCOUNTER — Other Ambulatory Visit: Payer: Self-pay

## 2017-10-02 ENCOUNTER — Inpatient Hospital Stay (HOSPITAL_COMMUNITY)
Admission: EM | Admit: 2017-10-02 | Discharge: 2017-10-05 | DRG: 203 | Disposition: A | Discharge status: Home / Self Care | Payer: BLUE CROSS/BLUE SHIELD | Attending: Family Medicine | Admitting: Family Medicine

## 2017-10-02 ENCOUNTER — Ambulatory Visit: Payer: Self-pay

## 2017-10-02 DIAGNOSIS — J4542 Moderate persistent asthma with status asthmaticus: Secondary | ICD-10-CM | POA: Diagnosis not present

## 2017-10-02 DIAGNOSIS — R0602 Shortness of breath: Secondary | ICD-10-CM | POA: Diagnosis not present

## 2017-10-02 DIAGNOSIS — F17200 Nicotine dependence, unspecified, uncomplicated: Secondary | ICD-10-CM

## 2017-10-02 DIAGNOSIS — F17210 Nicotine dependence, cigarettes, uncomplicated: Secondary | ICD-10-CM | POA: Diagnosis not present

## 2017-10-02 DIAGNOSIS — E875 Hyperkalemia: Secondary | ICD-10-CM | POA: Diagnosis not present

## 2017-10-02 DIAGNOSIS — M5430 Sciatica, unspecified side: Secondary | ICD-10-CM | POA: Diagnosis present

## 2017-10-02 DIAGNOSIS — E876 Hypokalemia: Secondary | ICD-10-CM | POA: Diagnosis present

## 2017-10-02 DIAGNOSIS — M545 Low back pain: Secondary | ICD-10-CM

## 2017-10-02 DIAGNOSIS — J4541 Moderate persistent asthma with (acute) exacerbation: Secondary | ICD-10-CM

## 2017-10-02 DIAGNOSIS — R0902 Hypoxemia: Secondary | ICD-10-CM

## 2017-10-02 DIAGNOSIS — R112 Nausea with vomiting, unspecified: Secondary | ICD-10-CM | POA: Diagnosis not present

## 2017-10-02 DIAGNOSIS — G8929 Other chronic pain: Secondary | ICD-10-CM | POA: Diagnosis not present

## 2017-10-02 DIAGNOSIS — Z8249 Family history of ischemic heart disease and other diseases of the circulatory system: Secondary | ICD-10-CM | POA: Diagnosis not present

## 2017-10-02 DIAGNOSIS — Z825 Family history of asthma and other chronic lower respiratory diseases: Secondary | ICD-10-CM | POA: Diagnosis not present

## 2017-10-02 DIAGNOSIS — F1010 Alcohol abuse, uncomplicated: Secondary | ICD-10-CM

## 2017-10-02 DIAGNOSIS — J45901 Unspecified asthma with (acute) exacerbation: Secondary | ICD-10-CM | POA: Diagnosis not present

## 2017-10-02 LAB — BASIC METABOLIC PANEL
ANION GAP: 13 (ref 5–15)
BUN: 11 mg/dL (ref 6–20)
CO2: 23 mmol/L (ref 22–32)
Calcium: 9 mg/dL (ref 8.9–10.3)
Chloride: 101 mmol/L (ref 101–111)
Creatinine, Ser: 0.93 mg/dL (ref 0.61–1.24)
GFR calc Af Amer: >60 mL/min (ref >60)
GLUCOSE: 142 mg/dL — AB (ref 65–99)
POTASSIUM: 3.1 mmol/L — AB (ref 3.5–5.1)
Sodium: 137 mmol/L (ref 135–145)

## 2017-10-02 LAB — I-STAT VENOUS BLOOD GAS, ED
Acid-base deficit: 3 mmol/L — ABNORMAL HIGH (ref 0.0–2.0)
BICARBONATE: 22.7 mmol/L (ref 20.0–28.0)
O2 SAT: 76 %
PO2 VEN: 43 mmHg (ref 32.0–45.0)
TCO2: 24 mmol/L (ref 22–32)
pCO2, Ven: 42.1 mmHg — ABNORMAL LOW (ref 44.0–60.0)
pH, Ven: 7.34 (ref 7.250–7.430)

## 2017-10-02 LAB — CBC WITH DIFFERENTIAL/PLATELET
BASOS ABS: 0.1 10*3/uL (ref 0.0–0.1)
Basophils Relative: 1 %
Eosinophils Absolute: 1.3 10*3/uL — ABNORMAL HIGH (ref 0.0–0.7)
Eosinophils Relative: 11 %
HEMATOCRIT: 41.3 % (ref 39.0–52.0)
HEMOGLOBIN: 14.6 g/dL (ref 13.0–17.0)
LYMPHS PCT: 11 %
Lymphs Abs: 1.3 10*3/uL (ref 0.7–4.0)
MCH: 30.4 pg (ref 26.0–34.0)
MCHC: 35.4 g/dL (ref 30.0–36.0)
MCV: 86 fL (ref 78.0–100.0)
Monocytes Absolute: 0.6 10*3/uL (ref 0.1–1.0)
Monocytes Relative: 5 %
NEUTROS ABS: 8.7 10*3/uL — AB (ref 1.7–7.7)
Neutrophils Relative %: 72 %
Platelets: 245 10*3/uL (ref 150–400)
RBC: 4.8 MIL/uL (ref 4.22–5.81)
RDW: 12.6 % (ref 11.5–15.5)
WBC: 12 10*3/uL — ABNORMAL HIGH (ref 4.0–10.5)

## 2017-10-02 LAB — MRSA PCR SCREENING: MRSA by PCR: NEGATIVE

## 2017-10-02 LAB — INFLUENZA PANEL BY PCR (TYPE A & B)
Influenza A By PCR: NEGATIVE
Influenza B By PCR: NEGATIVE

## 2017-10-02 MED ORDER — ALBUTEROL (5 MG/ML) CONTINUOUS INHALATION SOLN
10.0000 mg/h | INHALATION_SOLUTION | RESPIRATORY_TRACT | Status: DC
  Administered 2017-10-02 (×2): 10 mg/h via RESPIRATORY_TRACT
  Filled 2017-10-02: qty 20

## 2017-10-02 MED ORDER — ALBUTEROL (5 MG/ML) CONTINUOUS INHALATION SOLN: 10.0000 mg/h | INHALATION_SOLUTION | Freq: Once | RESPIRATORY_TRACT | Status: DC

## 2017-10-02 MED ORDER — LEVALBUTEROL HCL 0.63 MG/3ML IN NEBU
0.6300 mg | INHALATION_SOLUTION | RESPIRATORY_TRACT | Status: DC
  Administered 2017-10-02 – 2017-10-03 (×2): 0.63 mg via RESPIRATORY_TRACT
  Filled 2017-10-02 (×2): qty 3

## 2017-10-02 MED ORDER — IPRATROPIUM BROMIDE 0.02 % IN SOLN
RESPIRATORY_TRACT | Status: AC
  Filled 2017-10-02: qty 2.5

## 2017-10-02 MED ORDER — METHYLPREDNISOLONE SODIUM SUCC 125 MG IJ SOLR
125.0000 mg | Freq: Once | INTRAMUSCULAR | Status: AC
  Administered 2017-10-02: 125 mg via INTRAVENOUS
  Filled 2017-10-02: qty 2

## 2017-10-02 MED ORDER — IPRATROPIUM BROMIDE 0.02 % IN SOLN
0.5000 mg | Freq: Once | RESPIRATORY_TRACT | Status: AC
  Administered 2017-10-02: 0.5 mg via RESPIRATORY_TRACT

## 2017-10-02 MED ORDER — IPRATROPIUM-ALBUTEROL 0.5-2.5 (3) MG/3ML IN SOLN
3.0000 mL | RESPIRATORY_TRACT | Status: DC
  Administered 2017-10-02: 3 mL via RESPIRATORY_TRACT
  Filled 2017-10-02: qty 3

## 2017-10-02 MED ORDER — ALBUTEROL SULFATE HFA 108 (90 BASE) MCG/ACT IN AERS: 2.0000 | INHALATION_SPRAY | Freq: Four times a day (QID) | RESPIRATORY_TRACT | Status: DC | PRN

## 2017-10-02 MED ORDER — LEVALBUTEROL HCL 0.63 MG/3ML IN NEBU: 0.6300 mg | INHALATION_SOLUTION | RESPIRATORY_TRACT | Status: DC | PRN

## 2017-10-02 MED ORDER — ACETAMINOPHEN 325 MG PO TABS: 650.0000 mg | ORAL_TABLET | Freq: Four times a day (QID) | ORAL | Status: DC | PRN

## 2017-10-02 MED ORDER — ACETAMINOPHEN 650 MG RE SUPP: 650.0000 mg | Freq: Four times a day (QID) | RECTAL | Status: DC | PRN

## 2017-10-02 MED ORDER — IPRATROPIUM-ALBUTEROL 0.5-2.5 (3) MG/3ML IN SOLN: 3.0000 mL | Freq: Three times a day (TID) | RESPIRATORY_TRACT | Status: DC

## 2017-10-02 MED ORDER — POTASSIUM CHLORIDE CRYS ER 20 MEQ PO TBCR
40.0000 meq | EXTENDED_RELEASE_TABLET | Freq: Once | ORAL | Status: AC
  Administered 2017-10-02: 40 meq via ORAL
  Filled 2017-10-02: qty 2

## 2017-10-02 MED ORDER — PREDNISONE 20 MG PO TABS
50.0000 mg | ORAL_TABLET | Freq: Every day | ORAL | Status: DC
  Administered 2017-10-03: 50 mg via ORAL
  Filled 2017-10-02: qty 2

## 2017-10-02 MED ORDER — ALBUTEROL SULFATE (2.5 MG/3ML) 0.083% IN NEBU: 2.5000 mg | INHALATION_SOLUTION | RESPIRATORY_TRACT | Status: DC | PRN

## 2017-10-02 MED ORDER — IPRATROPIUM BROMIDE 0.02 % IN SOLN
0.5000 mg | RESPIRATORY_TRACT | Status: DC
  Administered 2017-10-02 – 2017-10-03 (×3): 0.5 mg via RESPIRATORY_TRACT
  Filled 2017-10-02 (×3): qty 2.5

## 2017-10-02 MED ORDER — ALBUTEROL SULFATE (2.5 MG/3ML) 0.083% IN NEBU
5.0000 mg | INHALATION_SOLUTION | Freq: Once | RESPIRATORY_TRACT | Status: DC
  Filled 2017-10-02: qty 6

## 2017-10-02 MED ORDER — MAGNESIUM SULFATE 2 GM/50ML IV SOLN
2.0000 g | Freq: Once | INTRAVENOUS | Status: AC
  Administered 2017-10-02: 2 g via INTRAVENOUS
  Filled 2017-10-02: qty 50

## 2017-10-02 MED ORDER — ALBUTEROL SULFATE (2.5 MG/3ML) 0.083% IN NEBU
INHALATION_SOLUTION | RESPIRATORY_TRACT | Status: AC
  Filled 2017-10-02: qty 6

## 2017-10-02 NOTE — Telephone Encounter (Signed)
Pt calling having "hard time getting air in" During call pt having congested cough and c/o wheezing. Pt has h/o asthma. Pt has been sick x 2 weeks. Pt c/o bilateral mid back pain. Advised pt to go to the nearest ED now. [facility] Reason for Disposition . Patient sounds very sick or weak to the triager  Answer Assessment - Initial Assessment Questions 1. ONSET: "When did the cough begin?"      2 weeks ago 2. SEVERITY: "How bad is the cough today?"      Very frequent- not sleeping at night 3. RESPIRATORY DISTRESS: "Describe your breathing."      Feel like he cannot get enough 4. FEVER: "Do you have a fever?" If so, ask: "What is your temperature, how was it measured, and when did it start?"     Had one last  5. SPUTUM: "Describe the color of your sputum" (clear, white, yellow, green)     Yellow brown 6. HEMOPTYSIS: "Are you coughing up any blood?" If so ask: "How much?" (flecks, streaks, tablespoons, etc.)     no 7. CARDIAC HISTORY: "Do you have any history of heart disease?" (e.g., heart attack, congestive heart failure)      no 8. LUNG HISTORY: "Do you have any history of lung disease?"  (e.g., pulmonary embolus, asthma, emphysema)     asthma 9. PE RISK FACTORS: "Do you have a history of blood clots?" (or: recent major surgery, recent prolonged travel, bedridden )     no 10. OTHER SYMPTOMS: "Do you have any other symptoms?" (e.g., runny nose, wheezing, chest pain)       Wheezing, runny nose mild back pain mid way up  11. PREGNANCY: "Is there any chance you are pregnant?" "When was your last menstrual period?"       n/a 12. TRAVEL: "Have you traveled out of the country in the last month?" (e.g., travel history, exposures) no  Protocols used: COUGH - ACUTE PRODUCTIVE-A-AH

## 2017-10-02 NOTE — ED Notes (Signed)
Pt bumped to 3 LNC

## 2017-10-02 NOTE — Progress Notes (Signed)
INTERIM PROGRESS  Examined patient with Dr. Ottie GlazierGunadasa after rounds.   Still with significant wheezing and coughing when taking deep breaths.   Patient also complaining of tachycardia.   We discussed his uncontrolled asthma home regimen as using the rescue inhaler 2-3x day chronically and not filling orders for qvar because he didn't feel that it helped.  We discussed the need for an outpatient controller medication and will be changing him to xopenex instead of albuterol in hopes of minimizing tachycardia.  -Dr. Parke SimmersBland

## 2017-10-02 NOTE — ED Notes (Signed)
Pt states he feels more open.

## 2017-10-02 NOTE — ED Provider Notes (Addendum)
[facility] EMERGENCY DEPARTMENT Provider Note   CSN: [id number] Arrival date & time: 10/02/17  0908     History   Chief Complaint Chief Complaint  Patient presents with  . Shortness of Breath    HPI Marc Henderson is a 32 y.o. male.  HPI  Patient is a 32 year old male with a history of asthma presenting for shortness of breath.  Patient denies any history of hospitalizations, intubations, or episodes of respiratory failure due to his asthma.  Patient reports that he is only on an albuterol rescue inhaler, and his primary care provider at [facility] wanted him on Spiriva, however he did not have the funds to pay for this.  Patient reports that he has had increasing shortness of breath for the past 1-2 weeks, but increased productive cough for the past 3-4 days.  Patient reports that he has a hacking cough that keeps him up at night.  Patient also reports that proximally 2 days ago he had a fever of 101.  Patient denies hemoptysis but does report cough productive of green to yellow sputum.  Patient also reports 2 days of a bilateral head throbbing headache, that is not a new headache for him, as well as myalgias.  Patient also reporting mid thoracic paraspinal muscular discomfort that started 1 day after his cough.  Patient denies any recent immobilization, hospitalization, hormone use, recent surgery, history of cancer treatment, or history of DVT/PE.  Patient notes that he smokes a pack of cigarettes a day.  Past Medical History:  Diagnosis Date  . Asthma     Patient Active Problem List   Diagnosis Date Noted  . Acute bilateral low back pain with bilateral sciatica 08/27/2017  . Need for prophylactic vaccination and inoculation against influenza 04/24/2017  . Moderate persistent asthma without complication 04/24/2017    History reviewed. No pertinent surgical history.     Home Medications    Prior to Admission medications   Medication Sig Start Date End  Date Taking? Authorizing Provider  albuterol (PROVENTIL HFA;VENTOLIN HFA) 108 (90 Base) MCG/ACT inhaler Inhale 2 puffs into the lungs every 6 (six) hours as needed for wheezing or shortness of breath. 08/27/17  Yes Sagardia, Eilleen Kempf, MD  cyclobenzaprine (FLEXERIL) 10 MG tablet Take 1 tablet (10 mg total) by mouth at bedtime. 08/27/17  Yes Sagardia, Eilleen Kempf, MD  ibuprofen (ADVIL,MOTRIN) 200 MG tablet Take 800 mg by mouth every 6 (six) hours as needed for moderate pain.   Yes [provider]  Lactobacillus (PROBIOTIC ACIDOPHILUS PO) Take 1 capsule by mouth 2 (two) times daily.   Yes [provider]  naproxen sodium (ALEVE) 220 MG tablet Take 440 mg by mouth as needed (pain).   Yes [provider]  beclomethasone (QVAR REDIHALER) 40 MCG/ACT inhaler Inhale 2 puffs into the lungs 2 (two) times daily. Patient not taking: Reported on 08/27/2017 04/24/17   Georgina Quint, MD  beclomethasone (QVAR) 40 MCG/ACT inhaler Inhale 2 puffs into the lungs 2 (two) times daily. Patient not taking: Reported on 08/27/2017 04/24/17   Georgina Quint, MD  meclizine (ANTIVERT) 12.5 MG tablet Take 1 tablet (12.5 mg total) by mouth 3 (three) times daily as needed for dizziness or nausea. Patient not taking: Reported on 04/24/2017 03/15/15   Jennye Moccasin, MD  tiotropium (SPIRIVA HANDIHALER) 18 MCG inhalation capsule Place 1 capsule (18 mcg total) into inhaler and inhale daily. Patient not taking: Reported on 10/02/2017 08/27/17   Georgina Quint, MD  Family History No family history on file.  Social History Social History   Tobacco Use  . Smoking status: Current Every Day Smoker    Packs/day: 1.00    Years: 8.00    Pack years: 8.00  . Smokeless tobacco: Never Used  Substance Use Topics  . Alcohol use: Yes    Comment: 1-2 beer/daily  . Drug use: No     Allergies   Patient has no known allergies.   Review of Systems Review of Systems  Constitutional: Positive  for chills and fever.  HENT: Positive for congestion and rhinorrhea.   Respiratory: Positive for shortness of breath and wheezing. Negative for chest tightness.   Cardiovascular: Negative for leg swelling.  Gastrointestinal: Positive for nausea and vomiting.  Musculoskeletal: Positive for back pain.  Neurological: Negative for syncope.  All other systems reviewed and are negative.    Physical Exam Updated Vital Signs BP (!) 106/58   Pulse (!) 114   Resp (!) 33   SpO2 (!) 87%   Physical Exam  Constitutional: He appears well-developed and well-nourished. No distress.  HENT:  Head: Normocephalic and atraumatic.  Mouth/Throat: Oropharynx is clear and moist.  Eyes: Conjunctivae and EOM are normal. Pupils are equal, round, and reactive to light.  Neck: Normal range of motion. Neck supple.  Cardiovascular: Normal rate, regular rhythm, S1 normal and S2 normal.  No murmur heard. Pulmonary/Chest: Effort normal. He has wheezes. He has no rales.  Diffuse expiratory wheezes with prolonged expiratory phase.  Tachypnea noted.  No accessory muscle use noted.  Abdominal: Soft. He exhibits no distension. There is no tenderness. There is no guarding.  Musculoskeletal: Normal range of motion. He exhibits no edema or deformity.  No calf tenderness to palpation.  No lower extremity edema.  Lymphadenopathy:    He has no cervical adenopathy.  Neurological: He is alert.  Cranial nerves grossly intact. Patient moves extremities symmetrically and with good coordination.  Skin: Skin is warm and dry. No rash noted. No erythema.  Psychiatric: He has a normal mood and affect. His behavior is normal. Judgment and thought content normal.  Nursing note and vitals reviewed.    ED Treatments / Results  Labs (all labs ordered are listed, but only abnormal results are displayed) Labs Reviewed  BASIC METABOLIC PANEL - Abnormal; Notable for the following components:      Result Value   Potassium 3.1 (*)     Glucose, Bld 142 (*)    All other components within normal limits  CBC WITH DIFFERENTIAL/PLATELET - Abnormal; Notable for the following components:   WBC 12.0 (*)    Neutro Abs 8.7 (*)    Eosinophils Absolute 1.3 (*)    All other components within normal limits  INFLUENZA PANEL BY PCR (TYPE A & B)    EKG  EKG Interpretation None       Radiology Dg Chest 1 View  Result Date: 10/02/2017 CLINICAL DATA:  Short of breath EXAM: CHEST  1 VIEW COMPARISON:  05/05/2014 FINDINGS: The heart size and mediastinal contours are within normal limits. Both lungs are clear. The visualized skeletal structures are unremarkable. IMPRESSION: No active disease. Electronically Signed   By: Marlan Palau M.D.   On: 10/02/2017 11:26    Procedures Procedures (including critical care time)  CRITICAL CARE Performed by: Elisha Ponder   Total critical care time: 35 minutes  Critical care time was exclusive of separately billable procedures and treating other patients.  Critical care was necessary to treat  or prevent imminent or life-threatening deterioration.  Critical care was time spent personally by me on the following activities: development of treatment plan with patient and/or surrogate as well as nursing, discussions with consultants, evaluation of patient's response to treatment, examination of patient, obtaining history from patient or surrogate, ordering and performing treatments and interventions, ordering and review of laboratory studies, ordering and review of radiographic studies, pulse oximetry and re-evaluation of patient's condition.   Medications Ordered in ED Medications  albuterol (PROVENTIL) (2.5 MG/3ML) 0.083% nebulizer solution 5 mg (5 mg Nebulization Not Given 10/02/17 1025)  albuterol (PROVENTIL) (2.5 MG/3ML) 0.083% nebulizer solution (  Not Given 10/02/17 1025)  albuterol (PROVENTIL,VENTOLIN) solution continuous neb (10 mg/hr Nebulization New Bag/Given 10/02/17 1012)    ipratropium (ATROVENT) 0.02 % nebulizer solution (  Not Given 10/02/17 1027)  ipratropium (ATROVENT) nebulizer solution 0.5 mg (0.5 mg Nebulization Given 10/02/17 1011)  methylPREDNISolone sodium succinate (SOLU-MEDROL) 125 mg/2 mL injection 125 mg (125 mg Intravenous Given 10/02/17 1134)  magnesium sulfate IVPB 2 g 50 mL (0 g Intravenous Stopped 10/02/17 1240)     Initial Impression / Assessment and Plan / ED Course  I have reviewed the triage vital signs and the nursing notes.  Pertinent labs & imaging results that were available during my care of the patient were reviewed by me and considered in my medical decision making (see chart for details).  Clinical Course as of Oct 03 1442  Wed Oct 02, 2017  1404 Ambulatory saturation of 84% is noted.  [AM]  1430 Patient reevaluated.  Patient has notable vesicular lung sounds but still has diffuse wheezes.  [AM]    Clinical Course User Index [AM] Elisha PonderMurray, Ellieanna Funderburg B, PA-C    Patient is nontoxic-appearing, able to complete full sentences and one breath, but exhibiting tachypnea as well as tachycardia and new onset oxygen requirement.  Patient appearing to be an asthma exacerbation.  Differential diagnosis also includes pneumonia and pulmonary embolism.  Doubt pulmonary embolism, which was discussed with Dr. Frederick Peersachel Little, as patient has no other contributory risk factors for pulmonary embolism, and diffuse wheezing on exam is more consistent with patient's reported history of asthma.  Continuous neb initiated.  Additionally, will order magnesium and Solu-Medrol.  Slight leukocytosis at 12.1.  Potassium is 3.1.  Will order EKG and replete.  See ED course for reevaluation's. Patient is not responding to maximal therapies for asthma at this time as he is 84% on ambulation on room air.  Will seek admission at this time.  3:09 PM Spoke with Dr. Talbert ForestShirley of family medicine residency service (attending Dr. Leveda AnnaHensel).  Appreciate their involvement  admission.  Final Clinical Impressions(s) / ED Diagnoses   Final diagnoses:  Hypoxia  Shortness of breath    ED Discharge Orders    None       Delia ChimesMurray, Vernee Baines B, PA-C 10/02/17 1510    Little, Ambrose Finlandachel Morgan, MD 10/04/17 2207    Aviva KluverMurray, Rodrecus Belsky B, PA-C 10/05/17 1620    Little, Ambrose Finlandachel Morgan, MD 10/09/17 2036

## 2017-10-02 NOTE — ED Notes (Signed)
Pt walked to restroom with no oxygen and room air sats 84% upon return. Placed back on nasal cannula 2LNC .

## 2017-10-02 NOTE — H&P (Addendum)
[facility] Ad[facility] History and Physical Service Pager: [phone]  Patient name: Marc Henderson Medical record number: [id number] Date of birth: April 23, 1986 Age: 32 y.o. Gender: male  Primary Care Provider: Georgina QuintSagardia, Miguel Jose, MD Consultants: none Code Status: Full  Chief Complaint: SOB  Assessment and Plan: Marc Henderson is a 32 y.o. male presenting with SOB due to an acute asthma exacerbation. PMH is significant for moderate persistent asthma and sciatica.   Asthma Exacerbation: Patient reports that he has been using his albuterol rescue inhaler many times over the past week and a half.  Patient reports that he has not felt as if his asthma is under control for the past 2 years.  Previously on Spiriva (states this was prescribed to his mom, then he received his own rx as it was working for him), not for the past 2 years.  Patient reporting that he has been having a hacking cough that has kept him up all night.  Patient afebrile on arrival with negative influenza test.  CXR in ED showed no active disease.  Less likely PE with well score of 1.5 for tachycardia which is likely due to his albuterol treatments.  Patient also with new oxygen requirement of 2L [place]. SOB less likely CAP 2/2 normal CXR, less likely cardiac given no family hx of early heart disease, no chest pain.  - admit to stepdown, attending Dr. Leveda AnnaHensel - s/p CAT x2 hours, s/p Mag 2 g - albuterol q4 scheduled, fluticasone inhaler for daily use - Supplemental oxygen as needed to maintain oxygen saturations above 92% - s/p IV Solumderol 125mg , mag  - Consider Zofran PRN if causes nausea  - Monitor respiratory status - vitals per unit  Hypokalemia: K 3.1. Anticipate worsening 2/2 many albuterol treatments.  - s/p K-dur 40meq  FEN/GI: Regular  Prophylaxis: Lovenox  Disposition: admit to med-surg  History of Present Illness:  Marc Henderson is a 32 y.o. male presenting with worsening  shortness of breath.  Patient has past medical history of asthma and sciatica.  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  Reports that he had a cough that would not go away and caused him to come into the hospital.  Patient reports that for the past week and a half he has had increased cough and increased work of breathing due to his asthma.  He reports using his rescue inhaler, pro-air, many times over the past week and a half and the cough is kept him up throughout the night.  Patient is not on any other controller medications for his asthma.  Patient reports that he was previously on his mom Spiriva for about 4-5 years but the last time he did use her Spiriva was 2 years ago when she ran out of her prescription.  Patient reports fever and chills last night. Denies rhinorrhea.  Denies any hospitalizations.  Denies any recent sick contacts.  Received Mag, CAT x2 hours and IV solumedrol in the ED.   Review Of Systems: Per HPI with the following additions:   Review of Systems  Constitutional: Positive for chills and fever.  HENT: Negative for congestion.   Respiratory: Positive for cough, shortness of breath and wheezing. Negative for hemoptysis and sputum production.   Cardiovascular: Negative for chest pain.  Gastrointestinal: Negative for abdominal pain.  Genitourinary: Negative for dysuria and urgency.  Musculoskeletal: Positive for back pain.  Skin: Negative  for rash.  Neurological: Positive for headaches. Negative for dizziness.    Patient Active Problem List   Diagnosis Date Noted  . Asthma exacerbation 10/02/2017  . Acute bilateral low back pain with bilateral sciatica 08/27/2017  . Need for prophylactic vaccination and inoculation against influenza 04/24/2017  . Moderate persistent asthma without complication 04/24/2017    Past Medical History: Past Medical History:  Diagnosis Date  . Asthma      Past Surgical History: History reviewed. No pertinent surgical history.  Social History: Social History   Tobacco Use  . Smoking status: Current Every Day Smoker    Packs/day: 1.00    Years: 8.00    Pack years: 8.00  . Smokeless tobacco: Never Used  Substance Use Topics  . Alcohol use: Yes    Comment: 1-2 beer/daily  . 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  Family History: No family history on file. Father with asthma Brother with hypertension  Allergies and Medications: No Known Allergies No current facility-administered medications on file prior to encounter.    Current Outpatient Medications on File Prior to Encounter  Medication Sig Dispense Refill  . albuterol (PROVENTIL HFA;VENTOLIN HFA) 108 (90 Base) MCG/ACT inhaler Inhale 2 puffs into the lungs every 6 (six) hours as needed for wheezing or shortness of breath. 1 Inhaler 5  . cyclobenzaprine (FLEXERIL) 10 MG tablet Take 1 tablet (10 mg total) by mouth at bedtime. 30 tablet 0  . ibuprofen (ADVIL,MOTRIN) 200 MG tablet Take 800 mg by mouth every 6 (six) hours as needed for moderate pain.    . Lactobacillus (PROBIOTIC ACIDOPHILUS PO) Take 1 capsule by mouth 2 (two) times daily.    . naproxen sodium (ALEVE) 220 MG tablet Take 440 mg by mouth as needed (pain).    Marland Kitchen beclomethasone (QVAR REDIHALER) 40 MCG/ACT inhaler Inhale 2 puffs into the lungs 2 (two) times daily. (Patient not taking: Reported on 08/27/2017) 1 Inhaler 5  . beclomethasone (QVAR) 40 MCG/ACT inhaler Inhale 2 puffs into the lungs 2 (two) times daily. (Patient not taking: Reported on 08/27/2017) 1 Inhaler 12  . meclizine (ANTIVERT) 12.5 MG tablet Take 1 tablet (12.5 mg total) by mouth 3 (three) times daily as needed for dizziness or nausea. (Patient not taking: Reported on 04/24/2017) 30 tablet 1  . tiotropium (SPIRIVA HANDIHALER) 18 MCG inhalation capsule Place 1 capsule (18 mcg  total) into inhaler and inhale daily. (Patient not taking: Reported on 10/02/2017) 30 capsule 12    Objective: BP 134/77 (BP Location: Right Arm)   Pulse (!) 121   Temp 98.6 F (37 C) (Oral)   Resp 18   Ht 5\' 11"  (1.803 m)   SpO2 92%   BMI 32.16 kg/m  Exam: General: NAD, pleasant, sitting up on edge of bed Eyes: PERRL, EOMI, no conjunctival pallor or injection ENTM: Moist mucous membranes, no pharyngeal erythema or exudate Neck: Supple, no LAD Cardiovascular: RRR, no m/r/g, no LE edema Respiratory: Diffuse wheezing with decreased air exchange, increased work of breathing on 2L [place] Gastrointestinal: soft, nontender, nondistended MSK: moves 4 extremities equally Derm: no rashes appreciated Neuro: CN II-XII grossly intact Psych: AOx3, flat affect  Labs and Imaging: CBC BMET  Recent Labs  Lab 10/02/17 1117  WBC 12.0*  HGB 14.6  HCT 41.3  PLT 245   Recent Labs  Lab 10/02/17 1117  NA 137  K 3.1*  CL 101  CO2 23  BUN  11  CREATININE 0.93  GLUCOSE 142*  CALCIUM 9.0     Dg Chest 1 View  Result Date: 10/02/2017 CLINICAL DATA:  Short of breath EXAM: CHEST  1 VIEW COMPARISON:  05/05/2014 FINDINGS: The heart size and mediastinal contours are within normal limits. Both lungs are clear. The visualized skeletal structures are unremarkable. IMPRESSION: No active disease. Electronically Signed   By: Marlan Palau M.D.   On: 10/02/2017 11:26    Shirley, [place], DO 10/02/2017, 4:40 PM PGY-1, [facility] FPTS Intern pager: [phone], text pages welcome  FPTS Upper-Level Resident Addendum  I have independently interviewed and examined the patient. I have discussed the above with the original author and agree with their documentation. My edits for correction/addition/clarification are in blue. Please see also any attending notes.   Loni Muse, MD PGY-2, [facility] FPTS Service pager: [phone] (text pages welcome through [facility])

## 2017-10-02 NOTE — ED Triage Notes (Signed)
Pt presents with asthma attack. Pt reports symptoms have been worsening over past week. Only using rescue inhaler at home, told by PCP to come here for workup. Reports cough and congestion as well. Pt wheezing on arrival.

## 2017-10-02 NOTE — Telephone Encounter (Signed)
Thanks

## 2017-10-02 NOTE — ED Provider Notes (Signed)
Patient placed in Quick Look pathway, seen and evaluated   Chief Complaint: SOB  HPI:   1.5 wks of increased SOB with associated URI sxs. SOB is worsening. H/o asthma, has been using inhaler and mom's neb without relief. Feels worse/different than normal asthma exacerbation. ?subjective fever at home last night. SOB with associated back pain, unrelieved by ibuprofen. No CP.   ROS: SOB and wheezing  Physical Exam:   Gen: mildly increased work of breathing  Neuro: Awake and Alert  Skin: Warm  Pulm: increased wob. Initially hypoxic at 89% on RA, 94% on 2 L via [place]. Inspiratory and expiratory wheezing in all fields. Short sentences due to difficulty breathing.   Order cxr and flu swab. Currently getting neb. Pt to be moved to an acute room for further workup.   Initiation of care has begun. The patient has been counseled on the process, plan, and necessity for staying for the completion/evaluation, and the remainder of the medical screening examination    Alveria ApleyCaccavale, Darenda Fike, PA-C 10/02/17 0935    Little, Ambrose Finlandachel Morgan, MD 10/04/17 2204

## 2017-10-02 NOTE — ED Notes (Signed)
RT called to evaluate. PT placed on 2 L and sats 90-91. Bumped up to 3 L [place]

## 2017-10-02 NOTE — Progress Notes (Signed)
I saw and examined Mr. Marc Henderson.  Discussed with the resident team and agreed on a plan.  I will co sign the H&PE when available.  Briefly, 32 yo male with longstanding asthma poorly controled x 2 years due to poor access to health care.  Presents with 1 week hx of sig SOB.  Has used a full inhalor without relief.  No sx of infection.  No underlying cardio pulm issues other than asthma.    Has had SOB and nighttime cough consistently over past 2 years.  Absolutely needs chronic controller medication (inhaled steroids.) Issues: Status asthmaticus, O2 requiring.  Still wheezing despite treatments in ER.  Cont nebs.  Systemic steroids.  No indication for antibiotics. Poor access to healthcare.  Will need to verify affordable meds at the time of DC.

## 2017-10-03 ENCOUNTER — Other Ambulatory Visit: Payer: Self-pay

## 2017-10-03 DIAGNOSIS — F1010 Alcohol abuse, uncomplicated: Secondary | ICD-10-CM

## 2017-10-03 DIAGNOSIS — M545 Low back pain: Secondary | ICD-10-CM

## 2017-10-03 DIAGNOSIS — R0902 Hypoxemia: Secondary | ICD-10-CM

## 2017-10-03 DIAGNOSIS — G8929 Other chronic pain: Secondary | ICD-10-CM

## 2017-10-03 DIAGNOSIS — F17200 Nicotine dependence, unspecified, uncomplicated: Secondary | ICD-10-CM

## 2017-10-03 LAB — CBC
HEMATOCRIT: 41.2 % (ref 39.0–52.0)
HEMOGLOBIN: 14.4 g/dL (ref 13.0–17.0)
MCH: 30.4 pg (ref 26.0–34.0)
MCHC: 35 g/dL (ref 30.0–36.0)
MCV: 87.1 fL (ref 78.0–100.0)
Platelets: 299 10*3/uL (ref 150–400)
RBC: 4.73 MIL/uL (ref 4.22–5.81)
RDW: 12.9 % (ref 11.5–15.5)
WBC: 20.2 10*3/uL — AB (ref 4.0–10.5)

## 2017-10-03 LAB — BASIC METABOLIC PANEL
ANION GAP: 8 (ref 5–15)
BUN: 13 mg/dL (ref 6–20)
CALCIUM: 9.1 mg/dL (ref 8.9–10.3)
CO2: 24 mmol/L (ref 22–32)
Chloride: 104 mmol/L (ref 101–111)
Creatinine, Ser: 0.81 mg/dL (ref 0.61–1.24)
Glucose, Bld: 135 mg/dL — ABNORMAL HIGH (ref 65–99)
POTASSIUM: 4.9 mmol/L (ref 3.5–5.1)
SODIUM: 136 mmol/L (ref 135–145)

## 2017-10-03 LAB — HIV ANTIBODY (ROUTINE TESTING W REFLEX): HIV Screen 4th Generation wRfx: NONREACTIVE

## 2017-10-03 LAB — D-DIMER, QUANTITATIVE (NOT AT ARMC): D DIMER QUANT: 0.32 ug{FEU}/mL (ref 0.00–0.50)

## 2017-10-03 MED ORDER — FLUTICASONE FUROATE-VILANTEROL 200-25 MCG/INH IN AEPB
1.0000 | INHALATION_SPRAY | Freq: Every day | RESPIRATORY_TRACT | Status: DC
  Administered 2017-10-03 – 2017-10-05 (×3): 1 via RESPIRATORY_TRACT
  Filled 2017-10-03: qty 28

## 2017-10-03 MED ORDER — MONTELUKAST SODIUM 10 MG PO TABS
10.0000 mg | ORAL_TABLET | Freq: Every day | ORAL | Status: DC
  Administered 2017-10-03 – 2017-10-04 (×2): 10 mg via ORAL
  Filled 2017-10-03 (×2): qty 1

## 2017-10-03 MED ORDER — ADULT MULTIVITAMIN W/MINERALS CH
1.0000 | ORAL_TABLET | Freq: Every day | ORAL | Status: DC
  Administered 2017-10-03 – 2017-10-05 (×3): 1 via ORAL
  Filled 2017-10-03 (×3): qty 1

## 2017-10-03 MED ORDER — LEVALBUTEROL HCL 0.63 MG/3ML IN NEBU: 0.6300 mg | INHALATION_SOLUTION | RESPIRATORY_TRACT | Status: DC

## 2017-10-03 MED ORDER — LORAZEPAM 2 MG/ML IJ SOLN: 1.0000 mg | Freq: Four times a day (QID) | INTRAMUSCULAR | Status: DC | PRN

## 2017-10-03 MED ORDER — ORAL CARE MOUTH RINSE
15.0000 mL | Freq: Two times a day (BID) | OROMUCOSAL | Status: DC
  Administered 2017-10-03 – 2017-10-04 (×2): 15 mL via OROMUCOSAL

## 2017-10-03 MED ORDER — THIAMINE HCL 100 MG/ML IJ SOLN: 100.0000 mg | Freq: Every day | INTRAMUSCULAR | Status: DC

## 2017-10-03 MED ORDER — VITAMIN B-1 100 MG PO TABS
100.0000 mg | ORAL_TABLET | Freq: Every day | ORAL | Status: DC
  Administered 2017-10-03 – 2017-10-05 (×3): 100 mg via ORAL
  Filled 2017-10-03 (×3): qty 1

## 2017-10-03 MED ORDER — NICOTINE 14 MG/24HR TD PT24
14.0000 mg | MEDICATED_PATCH | Freq: Every day | TRANSDERMAL | Status: DC
  Administered 2017-10-03 – 2017-10-05 (×3): 14 mg via TRANSDERMAL
  Filled 2017-10-03 (×3): qty 1

## 2017-10-03 MED ORDER — ALBUTEROL (5 MG/ML) CONTINUOUS INHALATION SOLN
20.0000 mg/h | INHALATION_SOLUTION | RESPIRATORY_TRACT | Status: DC
Start: 2017-10-03 — End: 2017-10-05
  Administered 2017-10-03: 20 mg/h via RESPIRATORY_TRACT
  Filled 2017-10-03: qty 20

## 2017-10-03 MED ORDER — MAGNESIUM SULFATE 2 GM/50ML IV SOLN
2.0000 g | Freq: Once | INTRAVENOUS | Status: AC
  Administered 2017-10-03: 2 g via INTRAVENOUS
  Filled 2017-10-03: qty 50

## 2017-10-03 MED ORDER — IPRATROPIUM-ALBUTEROL 0.5-2.5 (3) MG/3ML IN SOLN
RESPIRATORY_TRACT | Status: AC
  Filled 2017-10-03: qty 3

## 2017-10-03 MED ORDER — IPRATROPIUM-ALBUTEROL 0.5-2.5 (3) MG/3ML IN SOLN
3.0000 mL | RESPIRATORY_TRACT | Status: DC
Start: 2017-10-03 — End: 2017-10-05
  Administered 2017-10-03 – 2017-10-05 (×14): 3 mL via RESPIRATORY_TRACT
  Filled 2017-10-03 (×14): qty 3

## 2017-10-03 MED ORDER — FOLIC ACID 1 MG PO TABS
1.0000 mg | ORAL_TABLET | Freq: Every day | ORAL | Status: DC
  Administered 2017-10-03 – 2017-10-05 (×3): 1 mg via ORAL
  Filled 2017-10-03 (×3): qty 1

## 2017-10-03 MED ORDER — METHYLPREDNISOLONE SODIUM SUCC 125 MG IJ SOLR
125.0000 mg | Freq: Once | INTRAMUSCULAR | Status: AC
  Administered 2017-10-03: 125 mg via INTRAVENOUS
  Filled 2017-10-03: qty 2

## 2017-10-03 MED ORDER — LORAZEPAM 1 MG PO TABS: 1.0000 mg | ORAL_TABLET | Freq: Four times a day (QID) | ORAL | Status: DC | PRN

## 2017-10-03 NOTE — Discharge Summary (Signed)
Family Medicine Teaching [facility] Discharge Summary  Patient name: Marc Henderson Medical record number: [id number] Date of birth: May 27, 1986 Age: 32 y.o. Gender: male Date of Admission: 10/02/2017  Date of Discharge: 10/05/17 Admitting Physician: Moses Manners, MD  Primary Care Provider: Georgina Quint, MD Consultants:   Indication for Hospitalization: asthma exacerbation  Discharge Diagnoses/Problem List:  Asthma vs copd Sciatica Tobacco use  Disposition: to home  Discharge Condition: stable  Discharge Exam: General: NAD, pleasant Eyes: PERRL, EOMI, no conjunctival pallor or injection ENTM: Moist mucous membranes, no pharyngeal erythema or exudate Neck: Supple, no LAD Cardiovascular: RRR, no m/r/g, no LE edema Respiratory:CTAB with only trace wheeze, no IWB, satting >95 on RA Gastrointestinal: soft, nontender, nondistended MSK: moves 4 extremities equally Derm: no rashes appreciated Psych: AOx3,flat affect  Brief Hospital Course:  Patient presented with significant exacerbation of asthma vs COPD (no prior diagnosis or PFT but has significant smokinh hx).  Has not been using controller medication and only rescue inhaler.  Started on short run of CAT followed by scheduled duonebs with prn albuterol.  Also treated with 2 days solumedrol injections followed by prednisone course with plans to d/c on steroids for total 10 day course.   Patient steadily improved but maintained an O2 requirement until day of d/c 3/16 when he was able to confidently discharge with good O2 sats on room air.   We had repeated discussion about smoking cessation and the importance of a controller medication as he was clearly uncontrolled asthma/copd.  We also discussed potential need for PFT when he returns to baseline health.  Issues for Follow Up:  1. Patient could use PFT.  Thinks he has COPD instead of asthma 2. Patient needs encouragement to stop smoking. Discharged with nicotene  patch.  3. Patient prescribed BREO  4. Patient prescribed singulair  5. Patient discharged with 5 additional days of 50mg  prednisone given severity of asthma exacerbation.  Significant Procedures:   Significant Labs and Imaging:  Recent Labs  Lab 10/03/17 0551 10/04/17 0327 10/05/17 0328  WBC 20.2* 20.1* 12.1*  HGB 14.4 13.8 13.5  HCT 41.2 41.0 39.5  PLT 299 320 301   Recent Labs  Lab 10/03/17 0551 10/04/17 0327 10/04/17 0810 10/05/17 0328  NA 136 142 141 138  K 4.9 5.7* 4.1 5.2*  CL 104 107 104 104  CO2 24 26 25 26   GLUCOSE 135* 153* 111* 137*  BUN 13 20 19  22*  CREATININE 0.81 1.05 0.97 1.03  CALCIUM 9.1 9.5 9.5 9.3    Dg Chest 1 View  Result Date: 10/02/2017 CLINICAL DATA:  Short of breath EXAM: CHEST  1 VIEW COMPARISON:  05/05/2014 FINDINGS: The heart size and mediastinal contours are within normal limits. Both lungs are clear. The visualized skeletal structures are unremarkable. IMPRESSION: No active disease. Electronically Signed   By: Marlan Palau M.D.   On: 10/02/2017 11:26    Results/Tests Pending at Time of Discharge:   Discharge Medications:  Allergies as of 10/05/2017   No Known Allergies     Medication List    STOP taking these medications   beclomethasone 40 MCG/ACT inhaler Commonly known as:  QVAR   beclomethasone 40 MCG/ACT inhaler Commonly known as:  QVAR REDIHALER   ibuprofen 200 MG tablet Commonly known as:  ADVIL,MOTRIN   meclizine 12.5 MG tablet Commonly known as:  ANTIVERT   naproxen sodium 220 MG tablet Commonly known as:  ALEVE   tiotropium 18 MCG inhalation capsule Commonly known as:  SPIRIVA HANDIHALER     TAKE these medications   albuterol 108 (90 Base) MCG/ACT inhaler Commonly known as:  PROVENTIL HFA;VENTOLIN HFA Inhale 2 puffs into the lungs every 6 (six) hours as needed for wheezing or shortness of breath. What changed:  Another medication with the same name was added. Make sure you understand how and when to take  each.   albuterol (2.5 MG/3ML) 0.083% nebulizer solution Commonly known as:  PROVENTIL Take 3 mLs (2.5 mg total) by nebulization every 6 (six) hours as needed for wheezing or shortness of breath. What changed:  You were already taking a medication with the same name, and this prescription was added. Make sure you understand how and when to take each.   cyclobenzaprine 10 MG tablet Commonly known as:  FLEXERIL Take 1 tablet (10 mg total) by mouth at bedtime.   fluticasone furoate-vilanterol 200-25 MCG/INH Aepb Commonly known as:  BREO ELLIPTA Inhale 1 puff into the lungs daily.   montelukast 10 MG tablet Commonly known as:  SINGULAIR Take 1 tablet (10 mg total) by mouth at bedtime.   nicotine 14 mg/24hr patch Commonly known as:  NICODERM CQ - dosed in mg/24 hours Place 1 patch (14 mg total) onto the skin daily.   predniSONE 50 MG tablet Commonly known as:  DELTASONE Take 1 tablet (50 mg total) by mouth daily with breakfast.   PROBIOTIC ACIDOPHILUS PO Take 1 capsule by mouth 2 (two) times daily.     ASK your doctor about these medications   AEROCHAMBER PLUS FLO-VU LARGE Misc 1 each by Other route once for 1 dose. Ask about: Should I take this medication?            Durable Medical Equipment  (From admission, onward)        Start     Ordered   10/05/17 0000  DME Nebulizer machine    Question:  Patient needs a nebulizer to treat with the following condition  Answer:  Asthma   10/05/17 1450      Discharge Instructions: Please refer to Patient Instructions section of EMR for full details.  Patient was counseled important signs and symptoms that should prompt return to medical care, changes in medications, dietary instructions, activity restrictions, and follow up appointments.   Follow-Up Appointments: Follow-up Information    Georgina QuintSagardia, Miguel Jose, MD. Go in 1 week(s).   Specialty:  Internal Medicine Why:  to recheck breathing Contact information: [address] [place] [address] [phone]           Marthenia RollingBland, Robbye Dede, DO 10/09/2017, 3:02 PM PGY-1, [facility]

## 2017-10-03 NOTE — Progress Notes (Addendum)
Family Medicine Teaching Service Daily Progress Note Intern Pager: [phone]  Patient name: Marc Henderson Medical record number: [id number] Date of birth: Jan 22, 1986 Age: 32 y.o. Gender: male  Primary Care Provider: Georgina QuintSagardia, Miguel Jose, MD Consultants: n/a Code Status: full  Pt Overview and Major Events to Date:  Marc Henderson is a 32 y.o. male presenting with SOB due to an acute asthma exacerbation. PMH is significant for moderate persistent asthma and sciatica.   Assessment and Plan: Marc Henderson is a 32 y.o. male presenting with SOB due to an acute asthma exacerbation. PMH is significant for moderate persistent asthma and sciatica.   Uncontrolled asthma vs COPD: 12 pack yr hx of smoking but with past records of asthma although he thinks its COPD.   for >5668yrs with chronic 2-3x/day albuterol use. With inconsistent qvar/borrowed spiriva use as controller.  Has been tachy on albuterol.  Was given solumedrol/mag in ED. -admit to stepdown, Dr. Leveda AnnaHensel -oxygen supplementation as needed -CAT (experienced increased O2 req when placed on scheduled xopenex) -daily prednise x5 (3/14- ) -Breo 200 daily inhaler -outpatient PFT to evaluate for asthma vs COPD -singulair daily  Sciatia- chronic w/ no neural deficits or current complaints.   Patient focused on asthma -tylenol/ibudprofen if patient begins to complain of pain  Hypokalemia: 3.1. Replaced with 40kdur -will check daily  Tobacco use: 12 yr pack hx, refuses cessation assistance -14mg  nicotine patch  Alcohol use: 3 drinks daily, chronic -CIWA protocola  FEN/GI: regular diet PPx: lovenox  Disposition: to home (likely 3/15-16)  Subjective:  Patient with significant wheezing on 8l high flow.   Does not think the albuterol/xopenex has been helping significantly.  Admits he likely needs a controller of some sort, not sure if he had ever had a PFT  Objective: Temp:  [98 F (36.7 C)-98.6 F (37 C)] 98 F (36.7 C)  (03/14 0428) Pulse Rate:  [101-132] 113 (03/14 0428) Resp:  [18-33] 31 (03/13 2353) BP: (106-134)/(44-77) 113/44 (03/14 0428) SpO2:  [86 %-96 %] 87 % (03/14 0428) FiO2 (%):  [45 %] 45 % (03/14 0403) Physical Exam: General: moderate IWB, pleasant Eyes: PERRL, EOMI, no conjunctival pallor or injection ENTM: Moist mucous membranes, no pharyngeal erythema or exudate Neck: Supple, no LAD Cardiovascular: RRR, no m/r/g, no LE edema Respiratory: Diffuse wheezing with decreased air exchange, increased work of breathing on 8L HF [place] Gastrointestinal: soft, nontender, nondistended MSK: moves 4 extremities equally Derm: no rashes appreciated Psych: AOx3, flat affect  Laboratory: Recent Labs  Lab 10/02/17 1117  WBC 12.0*  HGB 14.6  HCT 41.3  PLT 245   Recent Labs  Lab 10/02/17 1117  NA 137  K 3.1*  CL 101  CO2 23  BUN 11  CREATININE 0.93  CALCIUM 9.0  GLUCOSE 142*    Imaging/Diagnostic Tests: Dg Chest 1 View  Result Date: 10/02/2017 CLINICAL DATA:  Short of breath EXAM: CHEST  1 VIEW COMPARISON:  05/05/2014 FINDINGS: The heart size and mediastinal contours are within normal limits. Both lungs are clear. The visualized skeletal structures are unremarkable. IMPRESSION: No active disease. Electronically Signed   By: Marlan Palauharles  Clark M.D.   On: 10/02/2017 11:26    Marthenia RollingBland, Tasheika Kitzmiller, DO 10/03/2017, 5:13 AM PGY-1, [place] [facility] FPTS Intern pager: [phone], text pages welcome

## 2017-10-04 LAB — BASIC METABOLIC PANEL
ANION GAP: 9 (ref 5–15)
ANION GAP: 12 (ref 5–15)
BUN: 20 mg/dL (ref 6–20)
BUN: 19 mg/dL (ref 6–20)
CO2: 26 mmol/L (ref 22–32)
CO2: 25 mmol/L (ref 22–32)
Calcium: 9.5 mg/dL (ref 8.9–10.3)
Calcium: 9.5 mg/dL (ref 8.9–10.3)
Chloride: 107 mmol/L (ref 101–111)
Chloride: 104 mmol/L (ref 101–111)
Creatinine, Ser: 1.05 mg/dL (ref 0.61–1.24)
Creatinine, Ser: 0.97 mg/dL (ref 0.61–1.24)
GLUCOSE: 153 mg/dL — AB (ref 65–99)
GLUCOSE: 111 mg/dL — AB (ref 65–99)
POTASSIUM: 5.7 mmol/L — AB (ref 3.5–5.1)
POTASSIUM: 4.1 mmol/L (ref 3.5–5.1)
Sodium: 142 mmol/L (ref 135–145)
Sodium: 141 mmol/L (ref 135–145)

## 2017-10-04 LAB — CBC
HEMATOCRIT: 41 % (ref 39.0–52.0)
Hemoglobin: 13.8 g/dL (ref 13.0–17.0)
MCH: 30.1 pg (ref 26.0–34.0)
MCHC: 33.7 g/dL (ref 30.0–36.0)
MCV: 89.5 fL (ref 78.0–100.0)
PLATELETS: 320 10*3/uL (ref 150–400)
RBC: 4.58 MIL/uL (ref 4.22–5.81)
RDW: 12.8 % (ref 11.5–15.5)
WBC: 20.1 10*3/uL — AB (ref 4.0–10.5)

## 2017-10-04 MED ORDER — FUROSEMIDE 20 MG PO TABS
20.0000 mg | ORAL_TABLET | Freq: Once | ORAL | Status: AC
  Administered 2017-10-04: 20 mg via ORAL
  Filled 2017-10-04: qty 1

## 2017-10-04 MED ORDER — AEROCHAMBER PLUS FLO-VU LARGE MISC
1.0000 | Freq: Once | Status: DC
  Filled 2017-10-04: qty 1

## 2017-10-04 MED ORDER — PREDNISONE 20 MG PO TABS
50.0000 mg | ORAL_TABLET | Freq: Every day | ORAL | Status: DC
  Administered 2017-10-05: 50 mg via ORAL
  Filled 2017-10-04 (×2): qty 2

## 2017-10-04 MED ORDER — GUAIFENESIN-DM 100-10 MG/5ML PO SYRP
5.0000 mL | ORAL_SOLUTION | ORAL | Status: DC | PRN
  Administered 2017-10-04: 5 mL via ORAL
  Filled 2017-10-04: qty 5

## 2017-10-04 MED ORDER — PREDNISONE 20 MG PO TABS
50.0000 mg | ORAL_TABLET | Freq: Once | ORAL | Status: AC
  Administered 2017-10-04: 50 mg via ORAL
  Filled 2017-10-04: qty 2

## 2017-10-04 NOTE — Progress Notes (Signed)
Family Medicine Teaching Service Daily Progress Note Intern Pager: [phone]  Patient name: Marc BillDaniel E Archuletta Medical record number: [id number] Date of birth: [birth year] Age: 32 y.o. Gender: male  Primary Care Provider: Georgina QuintSagardia, Miguel Jose, MD Consultants: n/a Code Status: full  Pt Overview and Major Events to Date:  Marc Henderson is a 32 y.o. male presenting with SOB due to an acute asthma exacerbation. PMH is significant for moderate persistent asthma and sciatica.   Assessment and Plan: Marc BillDaniel E Yeomans is a 32 y.o. male presenting with SOB due to an acute asthma exacerbation. PMH is significant for moderate persistent asthma and sciatica.   Uncontrolled asthma vs COPD: 12 pack yr hx of smoking but with past records of asthma although he thinks its COPD.   for >8052yrs with chronic 2-3x/day albuterol use. With inconsistent qvar/borrowed spiriva use as controller.  Has been tachy on albuterol.  Was given solumedrol/mag x2. -admit to stepdown, Dr. Leveda AnnaHensel -oxygen supplementation as needed -duoneb q4h -albuterol neb q2prn, discussed with both patient and nurse the need to communicate when this is available -daily prednise x5 (3/14- ) -Breo 200 daily inhaler -outpatient PFT to evaluate for asthma vs COPD -singulair daily -aerochamber  Sciatia- chronic w/ no neural deficits or current complaints.   Patient focused on asthma -tylenol/ibudprofen if patient begins to complain of pain  Hyperkalemia: 5.7 up from 4.9 on 3/14.  ECG showed no significant changes.  Lab called and confirmed it was not a bad draw.  Evaluated patient and no cardiac symptoms -will monitor with PM labs -lasix 20, encouraged PO water  Tobacco use: 12 yr pack hx, Patient committed to stop smoking -14mg  nicotine patch  Alcohol use: 3 drinks daily, chronic -CIWA protocola  FEN/GI: regular diet PPx: lovenox  Disposition: to home (likely 3/16-17)  Subjective:  Patient feels he is improved greatly, would be  willing to go home but easily consents to finishing treatment.   Has committed to smoking cessation.  Objective: Temp:  [98 F (36.7 C)-98.1 F (36.7 C)] 98 F (36.7 C) (03/14 2321) Pulse Rate:  [110-116] 116 (03/14 2321) Resp:  [28-32] 28 (03/14 2321) BP: (102-131)/(55-69) 102/55 (03/14 2321) SpO2:  [87 %-94 %] 87 % (03/14 2321) Weight:  [222 lb 3.6 oz (100.8 kg)] 222 lb 3.6 oz (100.8 kg) (03/14 0558) Physical Exam: General: moderate IWB, pleasant Eyes: PERRL, EOMI, no conjunctival pallor or injection ENTM: Moist mucous membranes, no pharyngeal erythema or exudate Neck: Supple, no LAD Cardiovascular: RRR, no m/r/g, no LE edema Respiratory: Diffuse wheezing slightly improved with slightly improved air exchange, increased work of breathing on 6L  Gastrointestinal: soft, nontender, nondistended MSK: moves 4 extremities equally Derm: no rashes appreciated Psych: AOx3, flat affect  Laboratory: Recent Labs  Lab 10/02/17 1117 10/03/17 0551 10/04/17 0327  WBC 12.0* 20.2* 20.1*  HGB 14.6 14.4 13.8  HCT 41.3 41.2 41.0  PLT 245 299 320   Recent Labs  Lab 10/02/17 1117 10/03/17 0551 10/04/17 0327  NA 137 136 142  K 3.1* 4.9 5.7*  CL 101 104 107  CO2 23 24 26   BUN 11 13 20   CREATININE 0.93 0.81 1.05  CALCIUM 9.0 9.1 9.5  GLUCOSE 142* 135* 153*    Imaging/Diagnostic Tests: Dg Chest 1 View  Result Date: 10/02/2017 CLINICAL DATA:  Short of breath EXAM: CHEST  1 VIEW COMPARISON:  05/05/2014 FINDINGS: The heart size and mediastinal contours are within normal limits. Both lungs are clear. The visualized skeletal structures are unremarkable. IMPRESSION: No active  disease. Electronically Signed   By: Marlan Palau M.D.   On: 10/02/2017 11:26    Marthenia Rolling, DO 10/04/2017, 4:54 AM PGY-1, [place] [facility] FPTS Intern pager: [phone], text pages welcome

## 2017-10-04 NOTE — Progress Notes (Addendum)
RN paged on-call FMTS provider for potassium of 5.7. Orders placed for STAT EKG. EKG obtained, placed in chart and uploaded in Epic. Provider notified of EKG. Provider in to see patient. Will continue to monitor. Caswell Corwinana C Dwayn Moravek, RN 10/04/17 5:13 AM

## 2017-10-04 NOTE — Progress Notes (Signed)
RT approached this RN stating pt was requesting steroids for his asthma treatment and said that he had not received any steroids today. RN paged and notified FMTS on-call provider. Will continue to monitor. Caswell Corwin, RN 10/04/17 7:46 PM

## 2017-10-05 LAB — BASIC METABOLIC PANEL
Anion gap: 8 (ref 5–15)
BUN: 22 mg/dL — ABNORMAL HIGH (ref 6–20)
CHLORIDE: 104 mmol/L (ref 101–111)
CO2: 26 mmol/L (ref 22–32)
CREATININE: 1.03 mg/dL (ref 0.61–1.24)
Calcium: 9.3 mg/dL (ref 8.9–10.3)
GFR calc non Af Amer: >60 mL/min (ref >60)
Glucose, Bld: 137 mg/dL — ABNORMAL HIGH (ref 65–99)
POTASSIUM: 5.2 mmol/L — AB (ref 3.5–5.1)
Sodium: 138 mmol/L (ref 135–145)

## 2017-10-05 LAB — CBC
HEMATOCRIT: 39.5 % (ref 39.0–52.0)
HEMOGLOBIN: 13.5 g/dL (ref 13.0–17.0)
MCH: 30.3 pg (ref 26.0–34.0)
MCHC: 34.2 g/dL (ref 30.0–36.0)
MCV: 88.6 fL (ref 78.0–100.0)
PLATELETS: 301 10*3/uL (ref 150–400)
RBC: 4.46 MIL/uL (ref 4.22–5.81)
RDW: 12.6 % (ref 11.5–15.5)
WBC: 12.1 10*3/uL — ABNORMAL HIGH (ref 4.0–10.5)

## 2017-10-05 MED ORDER — PREDNISONE 50 MG PO TABS: 50.0000 mg | ORAL_TABLET | Freq: Every day | ORAL | 0 refills | Status: DC

## 2017-10-05 MED ORDER — FLUTICASONE FUROATE-VILANTEROL 200-25 MCG/INH IN AEPB: 1.0000 | INHALATION_SPRAY | Freq: Every day | RESPIRATORY_TRACT | 2 refills | Status: DC

## 2017-10-05 MED ORDER — ALBUTEROL SULFATE (2.5 MG/3ML) 0.083% IN NEBU: 2.5000 mg | INHALATION_SOLUTION | Freq: Four times a day (QID) | RESPIRATORY_TRACT | 12 refills | Status: DC | PRN

## 2017-10-05 MED ORDER — NICOTINE 14 MG/24HR TD PT24: 14.0000 mg | MEDICATED_PATCH | Freq: Every day | TRANSDERMAL | 0 refills | Status: DC

## 2017-10-05 MED ORDER — IPRATROPIUM-ALBUTEROL 0.5-2.5 (3) MG/3ML IN SOLN
3.0000 mL | RESPIRATORY_TRACT | Status: DC | PRN
  Administered 2017-10-05 (×2): 3 mL via RESPIRATORY_TRACT
  Filled 2017-10-05: qty 3

## 2017-10-05 MED ORDER — AEROCHAMBER PLUS FLO-VU LARGE MISC: 1.0000 | Freq: Once | 1 refills | Status: AC

## 2017-10-05 MED ORDER — MONTELUKAST SODIUM 10 MG PO TABS: 10.0000 mg | ORAL_TABLET | Freq: Every day | ORAL | 0 refills | Status: DC

## 2017-10-05 NOTE — Progress Notes (Signed)
Discharge instructions were given and understanding verbalized.  Denies questions.  Respirations even and unlabored.  Will continue to monitor.

## 2017-10-05 NOTE — Progress Notes (Signed)
Family Medicine Teaching Service Daily Progress Note Intern Pager: [phone]  Patient name: Marc BillDaniel E Erbes Medical record number: [id number] Date of birth: April 08, 1986 Age: 32 y.o. Gender: male  Primary Care Provider: Georgina QuintSagardia, Miguel Jose, MD Consultants: n/a Code Status: full  Pt Overview and Major Events to Date:  Marc Henderson is a 32 y.o. male presenting with SOB due to an acute asthma exacerbation. PMH is significant for moderate persistent asthma and sciatica.   Assessment and Plan: Marc BillDaniel E Soward is a 32 y.o. male presenting with SOB due to an acute asthma exacerbation. PMH is significant for moderate persistent asthma and sciatica.   Uncontrolled asthma vs COPD: 12 pack yr hx of smoking but with past records of asthma although he thinks its COPD.   for >2846yrs with chronic 2-3x/day albuterol use. With inconsistent qvar/borrowed spiriva use as controller.  Has been tachy on albuterol.  Was given solumedrol/mag x2. -admit to stepdown, Dr. Leveda AnnaHensel -oxygen supplementation as needed -duoneb q4h -albuterol neb q2prn, discussed with both patient and nurse the need to communicate when this is available -daily prednise x10 (3/14- ) -Breo 200 daily inhaler -outpatient PFT to evaluate for asthma vs COPD -singulair daily -aerochamber -will try to wean to room air  Sciatia- chronic w/ no neural deficits or current complaints.   Patient focused on asthma -tylenol/ibudprofen if patient begins to complain of pain  Hyperkalemia: 5.2 asymptomatic -will monitor  Tobacco use: 12 yr pack hx, Patient committed to stop smoking -14mg  nicotine patch  Alcohol use: stable with no withdraw features  FEN/GI: regular diet PPx: lovenox  Disposition: to home (likely 3/16-17)  Subjective:  Patient feels slightly improved and is content with plan to stay additional day due to persistent O2 requirement and diffuse wheezing.  Objective: Temp:  [98.1 F (36.7 C)] 98.1 F (36.7 C) (03/15  2153) Pulse Rate:  [96-115] 115 (03/15 2153) Resp:  [16-25] 16 (03/15 2153) BP: (119-128)/(69-72) 119/69 (03/15 2153) SpO2:  [90 %-98 %] 98 % (03/16 0422) Physical Exam: General: moderate IWB, pleasant Eyes: PERRL, EOMI, no conjunctival pallor or injection ENTM: Moist mucous membranes, no pharyngeal erythema or exudate Neck: Supple, no LAD Cardiovascular: RRR, no m/r/g, no LE edema Respiratory: Diffuse wheezing slightly improved with slightly improved air exchange from yesterday,  on 2L [place] Gastrointestinal: soft, nontender, nondistended MSK: moves 4 extremities equally Derm: no rashes appreciated Psych: AOx3, flat affect  Laboratory: Recent Labs  Lab 10/03/17 0551 10/04/17 0327 10/05/17 0328  WBC 20.2* 20.1* 12.1*  HGB 14.4 13.8 13.5  HCT 41.2 41.0 39.5  PLT 299 320 301   Recent Labs  Lab 10/04/17 0327 10/04/17 0810 10/05/17 0328  NA 142 141 138  K 5.7* 4.1 5.2*  CL 107 104 104  CO2 26 25 26   BUN 20 19 22*  CREATININE 1.05 0.97 1.03  CALCIUM 9.5 9.5 9.3  GLUCOSE 153* 111* 137*    Imaging/Diagnostic Tests: Dg Chest 1 View  Result Date: 10/02/2017 CLINICAL DATA:  Short of breath EXAM: CHEST  1 VIEW COMPARISON:  05/05/2014 FINDINGS: The heart size and mediastinal contours are within normal limits. Both lungs are clear. The visualized skeletal structures are unremarkable. IMPRESSION: No active disease. Electronically Signed   By: Marlan Palauharles  Clark M.D.   On: 10/02/2017 11:26    Marthenia RollingBland, Elonna Mcfarlane, DO 10/05/2017, 4:40 AM PGY-1, [place] [facility] FPTS Intern pager: [phone], text pages welcome

## 2017-10-05 NOTE — Care Management (Signed)
[facility]'s room and reviewed with patient.  No further CM needs at this time.

## 2017-10-09 ENCOUNTER — Telehealth: Payer: Self-pay | Admitting: *Deleted

## 2017-10-09 ENCOUNTER — Other Ambulatory Visit: Payer: Self-pay

## 2017-10-09 ENCOUNTER — Encounter: Payer: Self-pay | Admitting: Emergency Medicine

## 2017-10-09 ENCOUNTER — Ambulatory Visit (INDEPENDENT_AMBULATORY_CARE_PROVIDER_SITE_OTHER): Payer: BLUE CROSS/BLUE SHIELD | Admitting: Emergency Medicine

## 2017-10-09 VITALS — BP 115/76 | HR 110 | Temp 98.8°F | Resp 16 | Ht 71.0 in | Wt 230.6 lb

## 2017-10-09 DIAGNOSIS — J4540 Moderate persistent asthma, uncomplicated: Secondary | ICD-10-CM | POA: Diagnosis not present

## 2017-10-09 MED ORDER — CYCLOBENZAPRINE HCL 10 MG PO TABS: 10.0000 mg | ORAL_TABLET | Freq: Every day | ORAL | 1 refills | Status: DC

## 2017-10-09 MED ORDER — ALBUTEROL SULFATE (2.5 MG/3ML) 0.083% IN NEBU: 2.5000 mg | INHALATION_SOLUTION | Freq: Four times a day (QID) | RESPIRATORY_TRACT | 12 refills | Status: DC | PRN

## 2017-10-09 MED ORDER — MONTELUKAST SODIUM 10 MG PO TABS: 10.0000 mg | ORAL_TABLET | Freq: Every day | ORAL | 3 refills | Status: DC

## 2017-10-09 MED ORDER — FLUTICASONE FUROATE-VILANTEROL 200-25 MCG/INH IN AEPB: 1.0000 | INHALATION_SPRAY | Freq: Every day | RESPIRATORY_TRACT | 11 refills | Status: DC

## 2017-10-09 MED ORDER — NICOTINE 14 MG/24HR TD PT24: 14.0000 mg | MEDICATED_PATCH | Freq: Every day | TRANSDERMAL | 0 refills | Status: DC

## 2017-10-09 MED ORDER — ALBUTEROL SULFATE HFA 108 (90 BASE) MCG/ACT IN AERS: 2.0000 | INHALATION_SPRAY | Freq: Four times a day (QID) | RESPIRATORY_TRACT | 5 refills | Status: DC | PRN

## 2017-10-09 NOTE — Patient Instructions (Addendum)
   IF you received an x-ray today, you will receive an invoice from [facility]. Please contact [facility] at [phone] with questions or concerns regarding your invoice.   IF you received labwork today, you will receive an invoice from [facility]. Please contact [facility] at [phone] with questions or concerns regarding your invoice.   Our billing staff will not be able to assist you with questions regarding bills from these companies.  You will be contacted with the lab results as soon as they are available. The fastest way to get your results is to activate your My Chart account. Instructions are located on the last page of this paperwork. If you have not heard from us regarding the results in 2 weeks, please contact this office.     Asthma, Adult Asthma is a condition of the lungs in which the airways tighten and narrow. Asthma can make it hard to breathe. Asthma cannot be cured, but medicine and lifestyle changes can help control it. Asthma may be started (triggered) by:  Animal skin flakes (dander).  Dust.  Cockroaches.  Pollen.  Mold.  Smoke.  Cleaning products.  Hair sprays or aerosol sprays.  Paint fumes or strong smells.  Cold air, weather changes, and winds.  Crying or laughing hard.  Stress.  Certain medicines or drugs.  Foods, such as dried fruit, potato chips, and sparkling grape juice.  Infections or conditions (colds, flu).  Exercise.  Certain medical conditions or diseases.  Exercise or tiring activities.  Follow these instructions at home:  Take medicine as told by your doctor.  Use a peak flow meter as told by your doctor. A peak flow meter is a tool that measures how well the lungs are working.  Record and keep track of the peak flow meter's readings.  Understand and use the asthma action plan. An asthma action plan is a written plan for taking care of your asthma and treating your attacks.  To help prevent  asthma attacks: ? Do not smoke. Stay away from secondhand smoke. ? Change your heating and air conditioning filter often. ? Limit your use of fireplaces and wood stoves. ? Get rid of pests (such as roaches and mice) and their droppings. ? Throw away plants if you see mold on them. ? Clean your floors. Dust regularly. Use cleaning products that do not smell. ? Have someone vacuum when you are not home. Use a vacuum cleaner with a HEPA filter if possible. ? Replace carpet with wood, tile, or vinyl flooring. Carpet can trap animal skin flakes and dust. ? Use allergy-proof pillows, mattress covers, and box spring covers. ? Wash bed sheets and blankets every week in hot water and dry them in a dryer. ? Use blankets that are made of polyester or cotton. ? Clean bathrooms and kitchens with bleach. If possible, have someone repaint the walls in these rooms with mold-resistant paint. Keep out of the rooms that are being cleaned and painted. ? Wash hands often. Contact a doctor if:  You have make a whistling sound when breaking (wheeze), have shortness of breath, or have a cough even if taking medicine to prevent attacks.  The colored mucus you cough up (sputum) is thicker than usual.  The colored mucus you cough up changes from clear or white to yellow, green, gray, or bloody.  You have problems from the medicine you are taking such as: ? A rash. ? Itching. ? Swelling. ? Trouble breathing.  You need reliever medicines more than   2-3 times a week.  Your peak flow measurement is still at 50-79% of your personal best after following the action plan for 1 hour.  You have a fever. Get help right away if:  You seem to be worse and are not responding to medicine during an asthma attack.  You are short of breath even at rest.  You get short of breath when doing very little activity.  You have trouble eating, drinking, or talking.  You have chest pain.  You have a fast heartbeat.  Your  lips or fingernails start to turn blue.  You are light-headed, dizzy, or faint.  Your peak flow is less than 50% of your personal best. This information is not intended to replace advice given to you by your health care provider. Make sure you discuss any questions you have with your health care provider. Document Released: 12/26/2007 Document Revised: 12/15/2015 Document Reviewed: 02/05/2013 Elsevier Interactive Patient Education  2017 [employer]vier Inc.  

## 2017-10-09 NOTE — Telephone Encounter (Signed)
Faxed return to work note ATTN: [employer] at [employer], per patient. Confirmation page received at 3:41 pm.

## 2017-10-10 NOTE — Progress Notes (Signed)
Marc Henderson 32 y.o.   Chief Complaint  Patient presents with  . HYPOXIA    follow up - IP at [facility] x 4 days and note when to go back to work  . Medication Refill    ALL Rxs with RF beside them    HISTORY OF PRESENT ILLNESS: This is a 32 y.o. male with a history of asthma recently admitted to the hospital where he spent 3 days with an acute exacerbation.  There is a question of COPD here.  Chronic smoker.  Compliance with medications questionable.  Feels better.  Wants to go back to work tomorrow.  Requesting work note.  Also requesting medication refills.  States the hospital only gave him a 1 week supply.  The rest to be provided by PCP.  Also told that he needed a pulmonary consult for possible PFTs to be requested by PCP as well.  HPI   Prior to Admission medications   Medication Sig Start Date End Date Taking? Authorizing Provider  albuterol (PROVENTIL HFA;VENTOLIN HFA) 108 (90 Base) MCG/ACT inhaler Inhale 2 puffs into the lungs every 6 (six) hours as needed for wheezing or shortness of breath. 10/09/17  Yes Marc Henderson, Marc Kempf, MD  albuterol (PROVENTIL) (2.5 MG/3ML) 0.083% nebulizer solution Take 3 mLs (2.5 mg total) by nebulization every 6 (six) hours as needed for wheezing or shortness of breath. 10/09/17 11/08/17 Yes Jenayah Antu, Marc Kempf, MD  cyclobenzaprine (FLEXERIL) 10 MG tablet Take 1 tablet (10 mg total) by mouth at bedtime. 10/09/17  Yes Marc Quint, MD  DICLOFENAC PO Take 50 mg by mouth at bedtime.   Yes [provider]  fluticasone furoate-vilanterol (BREO ELLIPTA) 200-25 MCG/INH AEPB Inhale 1 puff into the lungs daily. 10/09/17  Yes Marc Henderson, Marc Kempf, MD  Lactobacillus (PROBIOTIC ACIDOPHILUS PO) Take 1 capsule by mouth 2 (two) times daily.   Yes [provider]  montelukast (SINGULAIR) 10 MG tablet Take 1 tablet (10 mg total) by mouth at bedtime. 10/09/17 01/07/18 Yes Marc Henderson, Marc Kempf, MD  nicotine (NICODERM CQ - DOSED IN MG/24  HOURS) 14 mg/24hr patch Place 1 patch (14 mg total) onto the skin daily. 10/09/17  Yes Marc Henderson, Marc Kempf, MD  predniSONE (DELTASONE) 50 MG tablet Take 1 tablet (50 mg total) by mouth daily with breakfast. 10/06/17  Yes Marc Bigness, MD    No Known Allergies  Patient Active Problem List   Diagnosis Date Noted  . Hypoxia   . Tobacco use disorder   . Alcohol abuse   . Chronic low back pain   . Asthma exacerbation 10/02/2017  . Acute bilateral low back pain with bilateral sciatica 08/27/2017  . Need for prophylactic vaccination and inoculation against influenza 04/24/2017  . Moderate persistent asthma without complication 04/24/2017    Past Medical History:  Diagnosis Date  . Asthma     No past surgical history on file.  Social History   Socioeconomic History  . Marital status: Married    Spouse name: Not on file  . Number of children: Not on file  . Years of education: Not on file  . Highest education level: Not on file  Occupational History  . Not on file  Social Needs  . Financial resource strain: Not on file  . Food insecurity:    Worry: Not on file    Inability: Not on file  . Transportation needs:    Medical: Not on file    Non-medical: Not on file  Tobacco Use  .  Smoking status: Current Every Day Smoker    Packs/day: 1.00    Years: 8.00    Pack years: 8.00  . Smokeless tobacco: Never Used  Substance and Sexual Activity  . Alcohol use: Yes    Comment: 1-2 beer/daily  . Drug use: No  . Sexual activity: Not on file  Lifestyle  . Physical activity:    Days per week: Not on file    Minutes per session: Not on file  . Stress: Not on file  Relationships  . Social connections:    Talks on phone: Not on file    Gets together: Not on file    Attends religious service: Not on file    Active member of club or organization: Not on file    Attends meetings of clubs or organizations: Not on file    Relationship status: Not on file  . Intimate partner  violence:    Fear of current or ex partner: Not on file    Emotionally abused: Not on file    Physically abused: Not on file    Forced sexual activity: Not on file  Other Topics Concern  . Not on file  Social History Narrative  . Not on file    No family history on file.   Review of Systems  Constitutional: Negative.  Negative for chills and fever.  HENT: Negative.   Eyes: Negative.   Respiratory: Positive for cough, shortness of breath and wheezing. Negative for hemoptysis and sputum production.   Cardiovascular: Negative.  Negative for chest pain and palpitations.  Gastrointestinal: Negative.  Negative for abdominal pain, diarrhea, nausea and vomiting.  Genitourinary: Negative.  Negative for hematuria.  Musculoskeletal: Positive for back pain (Chronic).  Skin: Negative.  Negative for rash.  Neurological: Negative.  Negative for dizziness and headaches.  Endo/Heme/Allergies: Negative.   All other systems reviewed and are negative.   Vitals:   10/09/17 1458  BP: 115/76  Pulse: (!) 110  Resp: 16  Temp: 98.8 F (37.1 C)  SpO2: 95%    Physical Exam  Constitutional: He is oriented to person, place, and time. He appears well-developed and well-nourished.  HENT:  Head: Normocephalic and atraumatic.  Right Ear: External ear normal.  Left Ear: External ear normal.  Nose: Nose normal.  Mouth/Throat: Oropharynx is clear and moist.  Eyes: Pupils are equal, round, and reactive to light. Conjunctivae and EOM are normal.  Neck: Normal range of motion. Neck supple. No thyromegaly present.  Cardiovascular: Normal rate, regular rhythm and normal heart sounds.  Pulmonary/Chest: Effort normal and breath sounds normal.  Abdominal: Soft. There is no tenderness.  Musculoskeletal: Normal range of motion.  Lymphadenopathy:    He has no cervical adenopathy.  Neurological: He is alert and oriented to person, place, and time.  Skin: Skin is warm and dry. Capillary refill takes less than  2 seconds.  Psychiatric: He has a normal mood and affect. His behavior is normal.  Vitals reviewed.    ASSESSMENT & PLAN: Marc Henderson was seen today for hypoxia and medication refill.  Diagnoses and all orders for this visit:  Moderate persistent asthma, unspecified whether complicated -     Ambulatory referral to Pulmonology -     fluticasone furoate-vilanterol (BREO ELLIPTA) 200-25 MCG/INH AEPB; Inhale 1 puff into the lungs daily. -     montelukast (SINGULAIR) 10 MG tablet; Take 1 tablet (10 mg total) by mouth at bedtime. -     cyclobenzaprine (FLEXERIL) 10 MG tablet; Take 1 tablet (10  mg total) by mouth at bedtime. -     albuterol (PROVENTIL HFA;VENTOLIN HFA) 108 (90 Base) MCG/ACT inhaler; Inhale 2 puffs into the lungs every 6 (six) hours as needed for wheezing or shortness of breath. -     albuterol (PROVENTIL) (2.5 MG/3ML) 0.083% nebulizer solution; Take 3 mLs (2.5 mg total) by nebulization every 6 (six) hours as needed for wheezing or shortness of breath. -     nicotine (NICODERM CQ - DOSED IN MG/24 HOURS) 14 mg/24hr patch; Place 1 patch (14 mg total) onto the skin daily.  Moderate persistent asthma without complication -     fluticasone furoate-vilanterol (BREO ELLIPTA) 200-25 MCG/INH AEPB; Inhale 1 puff into the lungs daily. -     montelukast (SINGULAIR) 10 MG tablet; Take 1 tablet (10 mg total) by mouth at bedtime. -     cyclobenzaprine (FLEXERIL) 10 MG tablet; Take 1 tablet (10 mg total) by mouth at bedtime. -     albuterol (PROVENTIL HFA;VENTOLIN HFA) 108 (90 Base) MCG/ACT inhaler; Inhale 2 puffs into the lungs every 6 (six) hours as needed for wheezing or shortness of breath. -     albuterol (PROVENTIL) (2.5 MG/3ML) 0.083% nebulizer solution; Take 3 mLs (2.5 mg total) by nebulization every 6 (six) hours as needed for wheezing or shortness of breath. -     nicotine (NICODERM CQ - DOSED IN MG/24 HOURS) 14 mg/24hr patch; Place 1 patch (14 mg total) onto the skin daily.    Patient  Instructions       IF you received an x-ray today, you will receive an invoice from [facility]. Please contact [facility] at [phone] with questions or concerns regarding your invoice.   IF you received labwork today, you will receive an invoice from [facility]. Please contact [facility] at [phone] with questions or concerns regarding your invoice.   Our billing staff will not be able to assist you with questions regarding bills from these companies.  You will be contacted with the lab results as soon as they are available. The fastest way to get your results is to activate your My Chart account. Instructions are located on the last page of this paperwork. If you have not heard from [place] regarding the results in 2 weeks, please contact this office.     Asthma, Adult Asthma is a condition of the lungs in which the airways tighten and narrow. Asthma can make it hard to breathe. Asthma cannot be cured, but medicine and lifestyle changes can help control it. Asthma may be started (triggered) by:  Animal skin flakes (dander).  Dust.  Cockroaches.  Pollen.  Mold.  Smoke.  Cleaning products.  Hair sprays or aerosol sprays.  Paint fumes or strong smells.  Cold air, weather changes, and winds.  Crying or laughing hard.  Stress.  Certain medicines or drugs.  Foods, such as dried fruit, potato chips, and sparkling grape juice.  Infections or conditions (colds, flu).  Exercise.  Certain medical conditions or diseases.  Exercise or tiring activities.  Follow these instructions at home:  Take medicine as told by your doctor.  Use a peak flow meter as told by your doctor. A peak flow meter is a tool that measures how well the lungs are working.  Record and keep track of the peak flow meter's readings.  Understand and use the asthma action plan. An asthma action plan is a written plan for taking care of your asthma and treating your attacks.  To  help prevent asthma  attacks: ? Do not smoke. Stay away from secondhand smoke. ? Change your heating and air conditioning filter often. ? Limit your use of fireplaces and wood stoves. ? Get rid of pests (such as roaches and mice) and their droppings. ? Throw away plants if you see mold on them. ? Clean your floors. Dust regularly. Use cleaning products that do not smell. ? Have someone vacuum when you are not home. Use a vacuum cleaner with a HEPA filter if possible. ? Replace carpet with wood, tile, or vinyl flooring. Carpet can trap animal skin flakes and dust. ? Use allergy-proof pillows, mattress covers, and box spring covers. ? Wash bed sheets and blankets every week in hot water and dry them in a dryer. ? Use blankets that are made of polyester or cotton. ? Clean bathrooms and kitchens with bleach. If possible, have someone repaint the walls in these rooms with mold-resistant paint. Keep out of the rooms that are being cleaned and painted. ? Wash hands often. Contact a doctor if:  You have make a whistling sound when breaking (wheeze), have shortness of breath, or have a cough even if taking medicine to prevent attacks.  The colored mucus you cough up (sputum) is thicker than usual.  The colored mucus you cough up changes from clear or white to yellow, green, gray, or bloody.  You have problems from the medicine you are taking such as: ? A rash. ? Itching. ? Swelling. ? Trouble breathing.  You need reliever medicines more than 2-3 times a week.  Your peak flow measurement is still at 50-79% of your personal best after following the action plan for 1 hour.  You have a fever. Get help right away if:  You seem to be worse and are not responding to medicine during an asthma attack.  You are short of breath even at rest.  You get short of breath when doing very little activity.  You have trouble eating, drinking, or talking.  You have chest pain.  You have a fast  heartbeat.  Your lips or fingernails start to turn blue.  You are light-headed, dizzy, or faint.  Your peak flow is less than 50% of your personal best. This information is not intended to replace advice given to you by your health care provider. Make sure you discuss any questions you have with your health care provider. Document Released: 12/26/2007 Document Revised: 12/15/2015 Document Reviewed: 02/05/2013 Elsevier Interactive Patient Education  2017 Elsevier Inc.      Edwina Barth, MD [facility]

## 2017-10-14 ENCOUNTER — Telehealth: Payer: Self-pay

## 2017-10-14 NOTE — Telephone Encounter (Signed)
Called and pt and new letter faxed to [phone] for pt work with updated documentation,.   Copied from CRM [phone]. Topic: General - Other >> Oct 11, 2017  1:29 PM Cipriano BunkerLambe, Annette S wrote: Reason for CRM: pt. Had not sent to work [employer] to go back to work.  They are requiring a note that states ok to go back to work with no restrictions and a date of when return.   From Dr. Terence LuxSargardia    >> Oct 14, 2017  9:25 AM Diana EvesHoyt, Maryann B wrote: Pt calling back to check on work note. Pt states he is unable to return to work until this note is corrected and faxed back over.  >> Oct 14, 2017  9:27 AM Diana EvesHoyt, Maryann B wrote: Note needs to have name, DOB, able to return back to work, no restrictions full duty.

## 2017-11-25 ENCOUNTER — Other Ambulatory Visit: Payer: Self-pay | Admitting: Emergency Medicine

## 2017-11-25 DIAGNOSIS — J4540 Moderate persistent asthma, uncomplicated: Secondary | ICD-10-CM

## 2018-03-13 ENCOUNTER — Other Ambulatory Visit: Payer: Self-pay | Admitting: Emergency Medicine

## 2018-03-13 DIAGNOSIS — J4540 Moderate persistent asthma, uncomplicated: Secondary | ICD-10-CM

## 2018-03-20 ENCOUNTER — Emergency Department: Payer: BLUE CROSS/BLUE SHIELD

## 2018-03-20 ENCOUNTER — Emergency Department
Admission: EM | Admit: 2018-03-20 | Discharge: 2018-03-20 | Disposition: A | Discharge status: Home / Self Care | Payer: BLUE CROSS/BLUE SHIELD | Attending: Emergency Medicine | Admitting: Emergency Medicine

## 2018-03-20 ENCOUNTER — Encounter: Payer: Self-pay | Admitting: *Deleted

## 2018-03-20 ENCOUNTER — Other Ambulatory Visit: Payer: Self-pay

## 2018-03-20 DIAGNOSIS — Z79899 Other long term (current) drug therapy: Secondary | ICD-10-CM | POA: Insufficient documentation

## 2018-03-20 DIAGNOSIS — F17210 Nicotine dependence, cigarettes, uncomplicated: Secondary | ICD-10-CM | POA: Insufficient documentation

## 2018-03-20 DIAGNOSIS — R071 Chest pain on breathing: Secondary | ICD-10-CM | POA: Insufficient documentation

## 2018-03-20 DIAGNOSIS — R079 Chest pain, unspecified: Secondary | ICD-10-CM | POA: Diagnosis not present

## 2018-03-20 DIAGNOSIS — J45909 Unspecified asthma, uncomplicated: Secondary | ICD-10-CM | POA: Insufficient documentation

## 2018-03-20 DIAGNOSIS — R0789 Other chest pain: Secondary | ICD-10-CM | POA: Diagnosis not present

## 2018-03-20 LAB — CBC
HCT: 41.3 % (ref 40.0–52.0)
Hemoglobin: 14.6 g/dL (ref 13.0–18.0)
MCH: 29.9 pg (ref 26.0–34.0)
MCHC: 35.3 g/dL (ref 32.0–36.0)
MCV: 84.8 fL (ref 80.0–100.0)
Platelets: 286 10*3/uL (ref 150–440)
RBC: 4.86 MIL/uL (ref 4.40–5.90)
RDW: 12.6 % (ref 11.5–14.5)
WBC: 6.4 10*3/uL (ref 3.8–10.6)

## 2018-03-20 LAB — BASIC METABOLIC PANEL
ANION GAP: 6 (ref 5–15)
BUN: 14 mg/dL (ref 6–20)
CALCIUM: 9.3 mg/dL (ref 8.9–10.3)
CO2: 30 mmol/L (ref 22–32)
Chloride: 104 mmol/L (ref 98–111)
Creatinine, Ser: 1.09 mg/dL (ref 0.61–1.24)
GLUCOSE: 111 mg/dL — AB (ref 70–99)
POTASSIUM: 3.9 mmol/L (ref 3.5–5.1)
SODIUM: 140 mmol/L (ref 135–145)

## 2018-03-20 LAB — TROPONIN I

## 2018-03-20 NOTE — ED Triage Notes (Signed)
Pt has chest pain for 3 days.  Left sided pain with pressure in the chest.  No sob.   No n/v/  Pt reports dizziness. Pt alert speech clear.

## 2018-03-20 NOTE — ED Provider Notes (Signed)
[facility] Emergency Department Provider Note  Time seen: 7:50 PM  I have reviewed the triage vital signs and the nursing notes.   HISTORY  Chief Complaint Chest Pain    HPI Marc Henderson is a 32 y.o. male with a past medical history of asthma who presents to the emergency department for chest discomfort.  According to the patient for the past 2 days he has been experiencing a pressure sensation/tightness sensation to his chest.  Denies any recent cough or congestion fever or trouble breathing.  No abdominal pain nausea vomiting or diarrhea.  No diaphoresis.  No leg pain or swelling.  Describes the pressure/tightness to his chest is very mild.   Past Medical History:  Diagnosis Date  . Asthma     Patient Active Problem List   Diagnosis Date Noted  . Hypoxia   . Tobacco use disorder   . Alcohol abuse   . Chronic low back pain   . Asthma exacerbation 10/02/2017  . Acute bilateral low back pain with bilateral sciatica 08/27/2017  . Need for prophylactic vaccination and inoculation against influenza 04/24/2017  . Moderate persistent asthma without complication 04/24/2017    No past surgical history on file.  Prior to Admission medications   Medication Sig Start Date End Date Taking? Authorizing Provider  albuterol (PROVENTIL) (2.5 MG/3ML) 0.083% nebulizer solution Take 3 mLs (2.5 mg total) by nebulization every 6 (six) hours as needed for wheezing or shortness of breath. 10/09/17 11/08/17  Georgina Quint, MD  cyclobenzaprine (FLEXERIL) 10 MG tablet Take 1 tablet (10 mg total) by mouth at bedtime. 10/09/17   Georgina Quint, MD  DICLOFENAC PO Take 50 mg by mouth at bedtime.    [provider]  fluticasone furoate-vilanterol (BREO ELLIPTA) 200-25 MCG/INH AEPB Inhale 1 puff into the lungs daily. 10/09/17   Georgina Quint, MD  Lactobacillus (PROBIOTIC ACIDOPHILUS PO) Take 1 capsule by mouth 2 (two) times daily.    [provider]  montelukast (SINGULAIR) 10 MG tablet Take 1 tablet (10 mg total) by mouth at bedtime. 10/09/17 01/07/18  Georgina Quint, MD  NICOTINE STEP 2 14 MG/24HR patch PLACE 1 PATCH (14 MG TOTAL) ONTO THE SKIN DAILY. 11/26/17   Georgina Quint, MD  predniSONE (DELTASONE) 50 MG tablet Take 1 tablet (50 mg total) by mouth daily with breakfast. 10/06/17   Garth Bigness, MD  PROAIR HFA 108 [phone] Base) MCG/ACT inhaler TAKE 2 PUFFS BY MOUTH EVERY 6 HOURS AS NEEDED FOR WHEEZE OR SHORTNESS OF BREATH 03/13/18   Georgina Quint, MD    No Known Allergies  No family history on file.  Social History Social History   Tobacco Use  . Smoking status: Current Every Day Smoker    Packs/day: 1.00    Years: 8.00    Pack years: 8.00  . Smokeless tobacco: Never Used  Substance Use Topics  . Alcohol use: Yes    Comment: 1-2 beer/daily  . Drug use: No    Review of Systems Constitutional: Negative for fever. ENT: Negative for recent illness/congestion Cardiovascular: Mild chest pressure/tightness Respiratory: Negative for shortness of breath.  Negative for cough. Gastrointestinal: Negative for abdominal pain, vomiting and diarrhea. Genitourinary: Negative for urinary compaints Musculoskeletal: Negative for leg pain or swelling Skin: Negative for skin complaints  Neurological: Negative for headache All other ROS negative  ____________________________________________   PHYSICAL EXAM:  VITAL SIGNS: ED Triage Vitals  Enc Vitals Group     BP 03/20/18  1827 106/72     Pulse Rate 03/20/18 1827 81     Resp 03/20/18 1827 16     Temp 03/20/18 1827 98.2 F (36.8 C)     Temp Source 03/20/18 1827 Oral     SpO2 03/20/18 1827 98 %     Weight 03/20/18 1827 230 lb (104.3 kg)     Height 03/20/18 1827 5\' 11"  (1.803 m)     Head Circumference --      Peak Flow --      Pain Score 03/20/18 1831 0     Pain Loc --      Pain Edu? --      Excl. in GC? --    Constitutional: Alert and  oriented. Well appearing and in no distress. Eyes: Normal exam ENT   Head: Normocephalic and atraumatic.   Mouth/Throat: Mucous membranes are moist. Cardiovascular: Normal rate, regular rhythm. No murmur. Respiratory: Normal respiratory effort without tachypnea nor retractions. Breath sounds are clear, without wheeze rales or rhonchi.  Good air movement bilaterally. Gastrointestinal: Soft and nontender. No distention.   Musculoskeletal: Nontender with normal range of motion in all extremities. No lower extremity tenderness or edema. Neurologic:  Normal speech and language. No gross focal neurologic deficits  Skin:  Skin is warm, dry and intact.  Psychiatric: Mood and affect are normal.   ____________________________________________    EKG  EKG reviewed and interpreted by myself shows normal sinus rhythm at 77 bpm with a narrow QRS, normal axis, normal intervals, no ST changes.  Normal EKG.  ____________________________________________    RADIOLOGY  Chest x-ray shows mild bronchitic changes.  ____________________________________________   INITIAL IMPRESSION / ASSESSMENT AND PLAN / ED COURSE  Pertinent labs & imaging results that were available during my care of the patient were reviewed by me and considered in my medical decision making (see chart for details).  This patient presents the emergency department for 2 days of chest pressure/tightness.  Differential would include ACS, asthma, bronchitis, upper respiratory infection, pneumonia.  Overall the patient appears very well, no distress with an overall normal physical examination.  Clear lung sounds bilaterally with good air movement.  Patient states a long smoking history.  Labs are normal including negative troponin reassuring EKG and chest x-ray.  We will discharge with PCP follow-up.  Patient agreeable to plan of care.  ____________________________________________   FINAL CLINICAL IMPRESSION(S) / ED  DIAGNOSES  chest pain    Minna AntisPaduchowski, Aubrianna Orchard, MD 03/20/18 1952

## 2018-03-27 NOTE — Congregational Nurse Program (Signed)
Client voices no health concerns today

## 2018-04-04 NOTE — Congregational Nurse Program (Signed)
No health concerns 

## 2018-04-25 DIAGNOSIS — Z23 Encounter for immunization: Secondary | ICD-10-CM | POA: Diagnosis not present

## 2018-06-22 ENCOUNTER — Other Ambulatory Visit: Payer: Self-pay | Admitting: Emergency Medicine

## 2018-06-22 DIAGNOSIS — J4540 Moderate persistent asthma, uncomplicated: Secondary | ICD-10-CM

## 2018-06-23 NOTE — Telephone Encounter (Signed)
Patient called and advised Proair was sent on 03/13/18 with 5 refills and if he's using them monthly, he should have 2 refills. He says that he had to use it more and picked up a refill twice in a month a couple of times. I asked how is his breathing now, he says he's fine now, just needed it a while back.

## 2018-06-23 NOTE — Telephone Encounter (Signed)
Patient called, left VM to return call to discuss the usage of Proair. Patient received refill on 03/13/18 #5 refills, which if patient is using monthly, should have 2 refills left.

## 2018-11-06 ENCOUNTER — Emergency Department (HOSPITAL_COMMUNITY)
Admission: EM | Admit: 2018-11-06 | Discharge: 2018-11-06 | Disposition: A | Discharge status: Home / Self Care | Payer: 59 | Attending: Emergency Medicine | Admitting: Emergency Medicine

## 2018-11-06 ENCOUNTER — Ambulatory Visit: Payer: Self-pay | Admitting: *Deleted

## 2018-11-06 ENCOUNTER — Encounter (HOSPITAL_COMMUNITY): Payer: Self-pay

## 2018-11-06 ENCOUNTER — Emergency Department (HOSPITAL_COMMUNITY): Payer: 59

## 2018-11-06 ENCOUNTER — Other Ambulatory Visit: Payer: Self-pay

## 2018-11-06 DIAGNOSIS — J069 Acute upper respiratory infection, unspecified: Secondary | ICD-10-CM | POA: Diagnosis not present

## 2018-11-06 DIAGNOSIS — J449 Chronic obstructive pulmonary disease, unspecified: Secondary | ICD-10-CM | POA: Insufficient documentation

## 2018-11-06 DIAGNOSIS — F17200 Nicotine dependence, unspecified, uncomplicated: Secondary | ICD-10-CM | POA: Insufficient documentation

## 2018-11-06 DIAGNOSIS — Z79899 Other long term (current) drug therapy: Secondary | ICD-10-CM | POA: Insufficient documentation

## 2018-11-06 DIAGNOSIS — Z209 Contact with and (suspected) exposure to unspecified communicable disease: Secondary | ICD-10-CM | POA: Insufficient documentation

## 2018-11-06 DIAGNOSIS — R0602 Shortness of breath: Secondary | ICD-10-CM | POA: Diagnosis present

## 2018-11-06 DIAGNOSIS — J4541 Moderate persistent asthma with (acute) exacerbation: Secondary | ICD-10-CM | POA: Diagnosis not present

## 2018-11-06 MED ORDER — PREDNISONE 20 MG PO TABS: 40.0000 mg | ORAL_TABLET | Freq: Every day | ORAL | 0 refills | Status: DC

## 2018-11-06 MED ORDER — ALBUTEROL SULFATE HFA 108 (90 BASE) MCG/ACT IN AERS
4.0000 | INHALATION_SPRAY | Freq: Once | RESPIRATORY_TRACT | Status: AC
  Administered 2018-11-06: 4 via RESPIRATORY_TRACT
  Filled 2018-11-06: qty 6.7

## 2018-11-06 NOTE — Telephone Encounter (Signed)
Patient is calling with chest congestion, respiratory symptom- inhaler is not helping- feels not getting enough air in, congestion. Wife was sick for 2 weeks- she was prescribed medication for cough and inhaler- it took her 10-14 day to get well. Patient needs to be seen for his respiratory symptoms- PCP office does not have any openings- advise UC/ED- patient will go now.  Reason for Disposition . MODERATE difficulty breathing (e.g., speaks in phrases, SOB even at rest, pulse 100-120)    Patient has history of asthma- inhaler is not helping symptoms- patient is SOB with exertion and has coughing spells with breathing. Patient wheezing at night. Patient advised ED/UC per office- no appointment available.  Answer Assessment - Initial Assessment Questions 1. COVID-19 DIAGNOSIS: "Who made your Coronavirus (COVID-19) diagnosis?" "Was it confirmed by a positive lab test?" If not diagnosed by a HCP, ask "Are there lots of cases (community spread) where you live?" (See public health department website, if unsure)   * MAJOR community spread: high number of cases; numbers of cases are increasing; many people hospitalized.   * MINOR community spread: low number of cases; not increasing; few or no people hospitalized     Community spread 2. ONSET: "When did the COVID-19 symptoms start?"      Saturday  3. WORST SYMPTOM: "What is your worst symptom?" (e.g., cough, fever, shortness of breath, muscle aches)     Respiratory symptoms- feels doesn't get enough air in  4. COUGH: "How bad is the cough?"       Depends on breathing- seems to be associated with breathing 5. FEVER: "Do you have a fever?" If so, ask: "What is your temperature, how was it measured, and when did it start?"     Not feverish- has not checked with thermometer 6. RESPIRATORY STATUS: "Describe your breathing?" (e.g., shortness of breath, wheezing, unable to speak)      Not getting enough air in, gets SOB with exertion- asthma- inhaler not  helping, breathing more shallow 7. BETTER-SAME-WORSE: "Are you getting better, staying the same or getting worse compared to yesterday?"  If getting worse, ask, "In what way?"     Getting worse- breathing getting worse- chest congestion worse 8. HIGH RISK DISEASE: "Do you have any chronic medical problems?" (e.g., asthma, heart or lung disease, weak immune system, etc.)     Asthma, smokes- has decreased 90% 9. PREGNANCY: "Is there any chance you are pregnant?" "When was your last menstrual period?"     n/a 10. OTHER SYMPTOMS: "Do you have any other symptoms?"  (e.g., runny nose, headache, sore throat, loss of smell)       Runny nose, congestion in sinus, headache ay night  Protocols used: CORONAVIRUS (COVID-19) DIAGNOSED OR SUSPECTED-A-AH

## 2018-11-06 NOTE — Discharge Instructions (Addendum)
You likely have asthma with another viral infection.  I cannot tell you whether it is a coronavirus or not.  Watch for worsening of symptoms.  Return as needed.

## 2018-11-06 NOTE — ED Notes (Signed)
ED Provider at bedside. 

## 2018-11-06 NOTE — ED Provider Notes (Signed)
[facility]-EMERGENCY DEPT Provider Note   CSN: [id number] Arrival date & time: 11/06/18  1207    History   Chief Complaint Chief Complaint  Patient presents with  . Shortness of Breath    HPI Marc Henderson is a 33 y.o. male.     HPI Patient presents with shortness of breath and cough.  History of asthma.  Symptoms first developed around 5 days ago.  States that his wife and had similar symptoms.  She had had a cough and felt bad.  He has some brown sputum production.  Mild chills at time but no fever.  No abdominal pain.  History of asthma.  States his inhaler really does not help.  States that he does have a nebulizer but really has not used it.  No abdominal pain.  States he smokes cigarettes but is smoking less recently. Past Medical History:  Diagnosis Date  . Asthma     Patient Active Problem List   Diagnosis Date Noted  . Hypoxia   . Tobacco use disorder   . Alcohol abuse   . Chronic low back pain   . Asthma exacerbation 10/02/2017  . Acute bilateral low back pain with bilateral sciatica 08/27/2017  . Need for prophylactic vaccination and inoculation against influenza 04/24/2017  . Moderate persistent asthma without complication 04/24/2017    History reviewed. No pertinent surgical history.      Home Medications    Prior to Admission medications   Medication Sig Start Date End Date Taking? Authorizing Provider  albuterol (PROAIR HFA) 108 (90 Base) MCG/ACT inhaler Inhale 2 puffs into the lungs every 6 (six) hours as needed for wheezing or shortness of breath. Appointment needed for more refills. 06/23/18   Georgina Quint, MD  albuterol (PROVENTIL) (2.5 MG/3ML) 0.083% nebulizer solution Take 3 mLs (2.5 mg total) by nebulization every 6 (six) hours as needed for wheezing or shortness of breath. 10/09/17 11/08/17  Georgina Quint, MD  cyclobenzaprine (FLEXERIL) 10 MG tablet Take 1 tablet (10 mg total) by mouth at bedtime. 10/09/17    Georgina Quint, MD  DICLOFENAC PO Take 50 mg by mouth at bedtime.    [provider]  fluticasone furoate-vilanterol (BREO ELLIPTA) 200-25 MCG/INH AEPB Inhale 1 puff into the lungs daily. 10/09/17   Georgina Quint, MD  Lactobacillus (PROBIOTIC ACIDOPHILUS PO) Take 1 capsule by mouth 2 (two) times daily.    [provider]  montelukast (SINGULAIR) 10 MG tablet Take 1 tablet (10 mg total) by mouth at bedtime. 10/09/17 01/07/18  Georgina Quint, MD  NICOTINE STEP 2 14 MG/24HR patch PLACE 1 PATCH (14 MG TOTAL) ONTO THE SKIN DAILY. 11/26/17   Georgina Quint, MD  predniSONE (DELTASONE) 20 MG tablet Take 2 tablets (40 mg total) by mouth daily. 11/06/18   Benjiman Core, MD  PROAIR HFA 108 [phone] Base) MCG/ACT inhaler TAKE 2 PUFFS BY MOUTH EVERY 6 HOURS AS NEEDED FOR WHEEZE OR SHORTNESS OF BREATH 03/13/18   Georgina Quint, MD    Family History History reviewed. No pertinent family history.  Social History Social History   Tobacco Use  . Smoking status: Current Every Day Smoker    Packs/day: 1.00    Years: 8.00    Pack years: 8.00  . Smokeless tobacco: Never Used  Substance Use Topics  . Alcohol use: Yes    Comment: 1-2 beer/daily  . Drug use: No     Allergies   Patient has no known  allergies.   Review of Systems Review of Systems  Constitutional: Positive for fatigue. Negative for appetite change.  HENT: Positive for congestion. Negative for sore throat.   Respiratory: Positive for cough, shortness of breath and wheezing.   Cardiovascular: Negative for chest pain.  Gastrointestinal: Negative for abdominal pain.  Genitourinary: Negative for flank pain.  Musculoskeletal: Negative for back pain.  Skin: Negative for rash.  Neurological: Negative for weakness.     Physical Exam Updated Vital Signs BP 128/84 (BP Location: Left Arm)   Pulse 95   Temp 98.6 F (37 C) (Oral)   Resp 20   Ht  (1.803 m)   Wt 111.1 kg   SpO2 96%    BMI 34.17 kg/m   Physical Exam Vitals signs and nursing note reviewed.  HENT:     Head: Atraumatic.  Neck:     Musculoskeletal: Neck supple.  Cardiovascular:     Rate and Rhythm: Regular rhythm.  Pulmonary:     Comments: Diffuse wheezes and prolonged expirations. Chest:     Chest wall: No tenderness.  Abdominal:     Tenderness: There is no abdominal tenderness.  Musculoskeletal:     Right lower leg: No edema.     Left lower leg: No edema.  Skin:    General: Skin is warm.  Neurological:     Mental Status: He is alert.     Comments: At baseline       ED Treatments / Results  Labs (all labs ordered are listed, but only abnormal results are displayed) Labs Reviewed - No data to display  EKG None  Radiology Dg Chest Portable 1 View  Result Date: 11/06/2018 CLINICAL DATA:  Cough and shortness of breath EXAM: PORTABLE CHEST 1 VIEW COMPARISON:  March 20, 2018 FINDINGS: There is mild scarring in the left perihilar region. There is no edema or consolidation. Heart size and pulmonary vascularity are normal. No adenopathy. No bone lesions. IMPRESSION: Mild scarring left perihilar region. No edema or consolidation. Stable cardiac silhouette. Electronically Signed   By: Bretta Bang III M.D.   On: 11/06/2018 12:49    Procedures Procedures (including critical care time)  Medications Ordered in ED Medications  albuterol (PROVENTIL HFA;VENTOLIN HFA) 108 (90 Base) MCG/ACT inhaler 4 puff (4 puffs Inhalation Given 11/06/18 1246)     Initial Impression / Assessment and Plan / ED Course  I have reviewed the triage vital signs and the nursing notes.  Pertinent labs & imaging results that were available during my care of the patient were reviewed by me and considered in my medical decision making (see chart for details).        Patient has asthma and COPD history.  Short of breath.  X-ray reassuring.  Has had some cough production cough.  Not hypoxic.  Discussed how we  cannot rule out COVID-19.  However will give steroids at this time.  We will follow-up as needed.  Will return for worsening symptoms.  Instructed on self isolation.  MAEJOR ERVEN was evaluated in Emergency Department on 11/06/2018 for the symptoms described in the history of present illness. He was evaluated in the context of the global COVID-19 pandemic, which necessitated consideration that the patient might be at risk for infection with the SARS-CoV-2 virus that causes COVID-19. Institutional protocols and algorithms that pertain to the evaluation of patients at risk for COVID-19 are in a state of rapid change based on information released by regulatory bodies including the CDC and federal and  state organizations. These policies and algorithms were followed during the patient's care in the ED.  Final Clinical Impressions(s) / ED Diagnoses   Final diagnoses:  Moderate persistent asthma with exacerbation  Upper respiratory tract infection, unspecified type    ED Discharge Orders         Ordered    predniSONE (DELTASONE) 20 MG tablet  Daily     11/06/18 1333           Benjiman CorePickering, Lailyn Appelbaum, MD 11/06/18 1408

## 2018-11-06 NOTE — ED Notes (Signed)
X-ray at bedside

## 2018-11-06 NOTE — ED Triage Notes (Signed)
Pt arrives via POV from home. Pt reports worsening shortness of breath for 1 week. Pt has hx of asthma, pt reports frequent use of inhaler without relief. Pt reports productive cough. Pt denies fevers.

## 2018-11-06 NOTE — ED Notes (Signed)
Attempted to explain MD's return to work note, patient was not understanding, appeared to be frustrated, called me "ignorant" and left

## 2018-11-06 NOTE — ED Notes (Signed)
Pt ambulated out of ED with steady gait. Pt verbalizes understanding of DC and follow up.

## 2018-11-11 ENCOUNTER — Telehealth (INDEPENDENT_AMBULATORY_CARE_PROVIDER_SITE_OTHER): Payer: 59 | Admitting: Family Medicine

## 2018-11-11 ENCOUNTER — Telehealth: Payer: Self-pay | Admitting: Emergency Medicine

## 2018-11-11 ENCOUNTER — Other Ambulatory Visit: Payer: Self-pay

## 2018-11-11 DIAGNOSIS — J4540 Moderate persistent asthma, uncomplicated: Secondary | ICD-10-CM

## 2018-11-11 DIAGNOSIS — J4541 Moderate persistent asthma with (acute) exacerbation: Secondary | ICD-10-CM

## 2018-11-11 MED ORDER — ALBUTEROL SULFATE (2.5 MG/3ML) 0.083% IN NEBU: 2.5000 mg | INHALATION_SOLUTION | Freq: Four times a day (QID) | RESPIRATORY_TRACT | 12 refills | Status: DC | PRN

## 2018-11-11 MED ORDER — AZITHROMYCIN 250 MG PO TABS: ORAL_TABLET | ORAL | 0 refills | Status: DC

## 2018-11-11 MED ORDER — AZITHROMYCIN 250 MG PO TABS
ORAL_TABLET | ORAL | 0 refills | Status: DC
Start: 2018-11-11 — End: 2019-02-25

## 2018-11-11 MED ORDER — FLUTICASONE-SALMETEROL 250-50 MCG/DOSE IN AEPB: 1.0000 | INHALATION_SPRAY | Freq: Two times a day (BID) | RESPIRATORY_TRACT | 3 refills | Status: DC

## 2018-11-11 NOTE — Patient Instructions (Signed)
Continue albuterol nebulizer treatment every 4 hours Continue Singulair nightly Start Zithromax as directed Start Advair as directed Follow-up on April 28 for reevaluation Call for worsening symptoms or failure to improve

## 2018-11-11 NOTE — Progress Notes (Signed)
Pt states he was seen in the ER on 11/06/2018 for his asthma and [facility] but he still does not feel any better. Pt states he still has severe SHOB and a cough. Pt states he completed the medication but feel it did not work.

## 2018-11-11 NOTE — Addendum Note (Signed)
Addended by: Golden Hurter on: 11/11/2018 02:06 PM   Modules accepted: Orders

## 2018-11-11 NOTE — Telephone Encounter (Signed)
Copied from CRM [id number]. Topic: Quick Communication - Rx Refill/Question >> Nov 11, 2018  1:24 PM Fanny Bien wrote: Medication: azithromycin (ZITHROMAX) 250 MG tablet [[id number]]  needs diagnoses code. Please advise

## 2018-11-11 NOTE — Progress Notes (Signed)
Telemedicine Encounter- SOAP NOTE Established Patient  I discussed the limitations, risks, security and privacy concerns of performing an evaluation and management service by WebX and the availability of in person appointments. I also discussed with the patient that there may be a patient responsible charge related to this service. The patient expressed understanding and agreed to proceed.  This Web X encounter was conducted with the patient's  verbal consent via video telecommunications: yes Patient was instructed to have this encounter in a suitably private space; and to only have persons present to whom they give permission to participate.Wife present with pt. In addition, patient identity was confirmed by use of name plus two identifiers (DOB and address).  I spent a total of 20 talking with the patient-via web X cc Pt states he was seen in the ER on 11/06/2018 [facility] but he still does not feel any better. Pt states he still has severe SHOB and a cough. Pt states he completed the medication but feel it did not work.   HPI Marc Henderson is a 33 y.o. male established patient.Video visit today for follow up on asthma exacerbation. Seen in ER 4/16 with oral steroids given for treatment.  Chest x-ray was completed which did not show pneumonia.  It was not tested for COVID.  Was not given antibiotics.  Pt has been using albuterol both nebulized and by MDI multiple times a day.  Patient has completed all oral steroids given by the emergency department.  Patient has been using Singulair daily.  Patient states he remains short of breath with activities of daily living.  Patient can take a shower but is winded upon completion.  Patient continues to cough.  Patient states cough productive in the morning with thick yellow-green sputum.  Patient also states he has nasal drainage- thick green.  Patient denies fever.  Patient denies sore throat, headache, nausea, vomiting.  States he has  diarrhea noted previously but slightly worse with acute symptoms.  Patient's wife had similar symptoms prior to onset of patient's symptoms 10 days ago.  Patient's children also have cough but no fever sore throat or diarrhea.  Neither the patient's wife nor children have asthma.  Patient and wife have isolated themselves and a neighbor is delivering groceries for the family.   Patient states he is able to eat and drink without difficulty.  Patient has not been able to work since April 16 when he was seen in the emergency department.   Patient Active Problem List   Diagnosis Date Noted   Hypoxia    Tobacco use disorder    Alcohol abuse    Chronic low back pain    Asthma exacerbation 10/02/2017   Acute bilateral low back pain with bilateral sciatica 08/27/2017   Need for prophylactic vaccination and inoculation against influenza 04/24/2017   Moderate persistent asthma without complication 04/24/2017    Past Medical History:  Diagnosis Date   Asthma     Current Outpatient Medications  Medication Sig Dispense Refill   albuterol (PROAIR HFA) 108 (90 Base) MCG/ACT inhaler Inhale 2 puffs into the lungs every 6 (six) hours as needed for wheezing or shortness of breath. Appointment needed for more refills. 8.5 Inhaler 3   albuterol (PROVENTIL) (2.5 MG/3ML) 0.083% nebulizer solution Take 3 mLs (2.5 mg total) by nebulization every 6 (six) hours as needed for up to 30 days for wheezing or shortness of breath. 75 mL 12   azithromycin (ZITHROMAX) 250 MG  tablet Take 2 po today then 1 po day 2-5 6 tablet 0   Fluticasone-Salmeterol (ADVAIR DISKUS) 250-50 MCG/DOSE AEPB Inhale 1 puff into the lungs 2 (two) times daily. 1 each 3   No current facility-administered medications for this visit.     No Known Allergies  Social History   Socioeconomic History   Marital status: Married    Spouse name: Not on file   Number of children: Not on file   Years of education: Not on file    Highest education level: Not on file  Occupational History   Not on file  Social Needs   Financial resource strain: Not on file   Food insecurity:    Worry: Not on file    Inability: Not on file   Transportation needs:    Medical: Not on file    Non-medical: Not on file  Tobacco Use   Smoking status: Current Every Day Smoker    Packs/day: 1.00    Years: 8.00    Pack years: 8.00   Smokeless tobacco: Never Used  Substance and Sexual Activity   Alcohol use: Yes    Comment: 1-2 beer/daily   Drug use: No   Sexual activity: Not on file  Lifestyle   Physical activity:    Days per week: Not on file    Minutes per session: Not on file   Stress: Not on file  Relationships   Social connections:    Talks on phone: Not on file    Gets together: Not on file    Attends religious service: Not on file    Active member of club or organization: Not on file    Attends meetings of clubs or organizations: Not on file    Relationship status: Not on file   Intimate partner violence:    Fear of current or ex partner: Not on file    Emotionally abused: Not on file    Physically abused: Not on file    Forced sexual activity: Not on file  Other Topics Concern   Not on file  Social History Narrative   Not on file    ROS CONSTITUTIONAL: no  Fever EENT:no- sinus problems, pt has nasal congestion, nosore throat CV: no chest pain RESP: SOB, cough, wheezing, sputum production-yellow, green -reviewed cxr report from ER GI: no heartburn, constipation, pt has diarrhea-no blood, pt denies nausea, vomiting  Objective  Pt unable to check temp at home Diagnoses and all orders for this visit:  Moderate persistent asthma with acute exacerbation  -     albuterol (PROVENTIL) (2.5 MG/3ML) 0.083% nebulizer solution; Take 3 mLs (2.5 mg total) by nebulization every 6 (six) hours as needed for up to 30 days for wheezing or shortness of breath.  Other orders -     azithromycin (ZITHROMAX)  250 MG tablet; Take 2 po today then 1 po day 2-5 -     Fluticasone-Salmeterol (ADVAIR DISKUS) 250-50 MCG/DOSE AEPB; Inhale 1 puff into the lungs 2 (two) times daily. Pt patient to continue to isolate with family has not used in the past-explained use after albuterol nebulizer treatment  Continue use of singulair daily OFF WORK UNTIL RE-EVAL 4/28 via Web X to establish if pt able to RTW.  Patient to continue to isolate with family.  Expect return to work on April 28 if symptoms have resolved.  Patient has experienced current symptoms since April 11.  Wife had similar symptoms prior to onset of patient's symptoms.  Wife symptoms have  improved.  Patient will continue to drink plenty of water.  Patient will notify if symptoms worsen or do not improve prior to follow-up appointment  I discussed the assessment and treatment plan with the patient. The patient was provided an opportunity to ask questions and all were answered. The patient agreed with the plan and demonstrated an understanding of the instructions.  Work note for 4/16-4/28 will be placed for access in [place] The patient was advised to call back or seek an in-person evaluation if the symptoms worsen or if the condition fails to improve as anticipated.  Kaliope Quinonez Mat Carne, MD  Primary Care at [facility] 11-11-18

## 2018-11-11 NOTE — Telephone Encounter (Signed)
Pt is not able to get the z pak medication due to his diagnosis of covid 19. Called pt mailbox is full at this time.

## 2018-11-11 NOTE — Telephone Encounter (Signed)
Copied from CRM [id number]. Topic: General - Other >> Nov 11, 2018  1:26 PM Tamela Oddi wrote: Reason for CRM: Patient called to inform the doctor that the pharmacy told him that they could not fill the script for a Z-pak because it had to have a diagnosis due to the COVID 19.  Please advise and call patient back to let him know how he can get his prescription filled

## 2018-11-12 NOTE — Telephone Encounter (Signed)
Z-Pak not prescribed by me.  I have not seen this patient recently.  He was however seen in the emergency room.  Not sure how I can help but recommend he calls the emergency department he was at.  Thanks.

## 2018-11-13 NOTE — Telephone Encounter (Signed)
Pt received the medication

## 2018-11-17 ENCOUNTER — Other Ambulatory Visit: Payer: Self-pay

## 2018-11-17 ENCOUNTER — Emergency Department
Admission: EM | Admit: 2018-11-17 | Discharge: 2018-11-18 | Disposition: A | Discharge status: Home / Self Care | Payer: 59 | Attending: Emergency Medicine | Admitting: Emergency Medicine

## 2018-11-17 ENCOUNTER — Encounter: Payer: Self-pay | Admitting: Emergency Medicine

## 2018-11-17 ENCOUNTER — Emergency Department: Payer: 59

## 2018-11-17 DIAGNOSIS — F17200 Nicotine dependence, unspecified, uncomplicated: Secondary | ICD-10-CM | POA: Insufficient documentation

## 2018-11-17 DIAGNOSIS — R609 Edema, unspecified: Secondary | ICD-10-CM

## 2018-11-17 DIAGNOSIS — R062 Wheezing: Secondary | ICD-10-CM | POA: Insufficient documentation

## 2018-11-17 DIAGNOSIS — Z79899 Other long term (current) drug therapy: Secondary | ICD-10-CM | POA: Diagnosis not present

## 2018-11-17 DIAGNOSIS — Z20828 Contact with and (suspected) exposure to other viral communicable diseases: Secondary | ICD-10-CM | POA: Diagnosis not present

## 2018-11-17 DIAGNOSIS — R2243 Localized swelling, mass and lump, lower limb, bilateral: Secondary | ICD-10-CM | POA: Diagnosis present

## 2018-11-17 LAB — CBC WITH DIFFERENTIAL/PLATELET
Abs Immature Granulocytes: 0.03 10*3/uL (ref 0.00–0.07)
Basophils Absolute: 0.1 10*3/uL (ref 0.0–0.1)
Basophils Relative: 1 %
Eosinophils Absolute: 1 10*3/uL — ABNORMAL HIGH (ref 0.0–0.5)
Eosinophils Relative: 13 %
HCT: 41.6 % (ref 39.0–52.0)
Hemoglobin: 14.3 g/dL (ref 13.0–17.0)
Immature Granulocytes: 0 %
Lymphocytes Relative: 21 %
Lymphs Abs: 1.7 10*3/uL (ref 0.7–4.0)
MCH: 28.8 pg (ref 26.0–34.0)
MCHC: 34.4 g/dL (ref 30.0–36.0)
MCV: 83.7 fL (ref 80.0–100.0)
Monocytes Absolute: 0.7 10*3/uL (ref 0.1–1.0)
Monocytes Relative: 8 %
Neutro Abs: 4.4 10*3/uL (ref 1.7–7.7)
Neutrophils Relative %: 57 %
Platelets: 270 10*3/uL (ref 150–400)
RBC: 4.97 MIL/uL (ref 4.22–5.81)
RDW: 12.2 % (ref 11.5–15.5)
WBC: 7.9 10*3/uL (ref 4.0–10.5)
nRBC: 0 % (ref 0.0–0.2)

## 2018-11-17 LAB — COMPREHENSIVE METABOLIC PANEL
ALT: 46 U/L — ABNORMAL HIGH (ref 0–44)
AST: 37 U/L (ref 15–41)
Albumin: 4 g/dL (ref 3.5–5.0)
Alkaline Phosphatase: 63 U/L (ref 38–126)
Anion gap: 7 (ref 5–15)
BUN: 10 mg/dL (ref 6–20)
CO2: 29 mmol/L (ref 22–32)
Calcium: 9.1 mg/dL (ref 8.9–10.3)
Chloride: 101 mmol/L (ref 98–111)
Creatinine, Ser: 0.89 mg/dL (ref 0.61–1.24)
GFR calc Af Amer: >60 mL/min (ref >60)
GFR calc non Af Amer: >60 mL/min (ref >60)
Glucose, Bld: 102 mg/dL — ABNORMAL HIGH (ref 70–99)
Potassium: 3.8 mmol/L (ref 3.5–5.1)
Sodium: 137 mmol/L (ref 135–145)
Total Bilirubin: 0.7 mg/dL (ref 0.3–1.2)
Total Protein: 7.4 g/dL (ref 6.5–8.1)

## 2018-11-17 LAB — TROPONIN I: Troponin I: <0.03 ng/mL (ref <0.03)

## 2018-11-17 LAB — SEDIMENTATION RATE: Sed Rate: 9 mm/hr (ref 0–15)

## 2018-11-17 NOTE — ED Notes (Signed)
Urine sample sent to lab

## 2018-11-17 NOTE — ED Notes (Signed)
US to bedside

## 2018-11-17 NOTE — ED Provider Notes (Signed)
[facility] Emergency Department Provider Note   ____________________________________________   First MD Initiated Contact with Patient 11/17/18 2025     (approximate)  I have reviewed the triage vital signs and the nursing notes.   HISTORY  Chief Complaint Joint Swelling    HPI Marc Henderson is a 33 y.o. male patient complains of about 1 month of increasing leg swelling.  He also has some venous stasis changes in his legs with some brown spotty skin changes that he is noticed been present for a month.  He is still wheezing and coughing up some green material.  He has not improved since his visit to [facility] or his phone calls with his primary care provider.  He got Zithromax and some prednisone and is now taking erythromycin.  He is worried about blood clots.  He reports a history of IV drug use in the past.         Past Medical History:  Diagnosis Date  . Asthma     Patient Active Problem List   Diagnosis Date Noted  . Hypoxia   . Tobacco use disorder   . Alcohol abuse   . Chronic low back pain   . Asthma exacerbation 10/02/2017  . Acute bilateral low back pain with bilateral sciatica 08/27/2017  . Need for prophylactic vaccination and inoculation against influenza 04/24/2017  . Moderate persistent asthma without complication 04/24/2017    History reviewed. No pertinent surgical history.  Prior to Admission medications   Medication Sig Start Date End Date Taking? Authorizing Provider  albuterol (PROAIR HFA) 108 (90 Base) MCG/ACT inhaler Inhale 2 puffs into the lungs every 6 (six) hours as needed for wheezing or shortness of breath. Appointment needed for more refills. 06/23/18   Georgina QuintSagardia, Miguel Jose, MD  albuterol (PROVENTIL) (2.5 MG/3ML) 0.083% nebulizer solution Take 3 mLs (2.5 mg total) by nebulization every 6 (six) hours as needed for up to 30 days for wheezing or shortness of breath. 11/11/18 12/11/18  Corum, Minerva FesterLisa L, MD   azithromycin (ZITHROMAX) 250 MG tablet Take 2 po today then 1 po day 2-5 11/11/18   Corum, Minerva FesterLisa L, MD  Fluticasone-Salmeterol (ADVAIR DISKUS) 250-50 MCG/DOSE AEPB Inhale 1 puff into the lungs 2 (two) times daily. 11/11/18   Corum, Minerva FesterLisa L, MD    Allergies Patient has no known allergies.  History reviewed. No pertinent family history.  Social History Social History   Tobacco Use  . Smoking status: Current Every Day Smoker    Packs/day: 1.00    Years: 8.00    Pack years: 8.00  . Smokeless tobacco: Never Used  Substance Use Topics  . Alcohol use: Yes    Comment: 1-2 beer/daily  . Drug use: No    Review of Systems  Constitutional: No fever/chills Eyes: No visual changes. ENT: No sore throat. Cardiovascular: Denies chest pain. Respiratory: Denies shortness of breath. Gastrointestinal: No abdominal pain.  No nausea, no vomiting.  No diarrhea.  No constipation. Genitourinary: Negative for dysuria. Musculoskeletal: Negative for back pain. Skin: Negative for rash. Neurological: Negative for headaches, focal weakness   ____________________________________________   PHYSICAL EXAM:  VITAL SIGNS: ED Triage Vitals  Enc Vitals Group     BP 11/17/18 1942 139/87     Pulse Rate 11/17/18 1942 97     Resp 11/17/18 1942 20     Temp 11/17/18 1942 98.4 F (36.9 C)     Temp Source 11/17/18 1942 Oral     SpO2 11/17/18 1942  98 %     Weight --      Height --      Head Circumference --      Peak Flow --      Pain Score 11/17/18 2035 0     Pain Loc --      Pain Edu? --      Excl. in GC? --     Constitutional: Alert and oriented. Well appearing and in no acute distress. Eyes: Conjunctivae are normal.  Head: Atraumatic. Nose: No congestion/rhinnorhea. Mouth/Throat: Mucous membranes are moist.  Oropharynx non-erythematous. Neck: No stridor. Cardiovascular: Normal rate, regular rhythm. Grossly normal heart sounds.  Good peripheral circulation. Respiratory: Normal respiratory effort.   No retractions. Lungs Fuhs wheezes Gastrointestinal: Soft and nontender. No distention. No abdominal bruits. No CVA tenderness. Musculoskeletal: No lower extremity tenderness lateral 2+ edema.   Neurologic:  Normal speech and language. No gross focal neurologic deficits are appreciated. Skin:  Skin is warm, dry and intact. No rash noted. Psychiatric: Mood and affect are normal. Speech and behavior are normal.  ____________________________________________   LABS (all labs ordered are listed, but only abnormal results are displayed)  Labs Reviewed  NOVEL CORONAVIRUS, NAA (HOSPITAL ORDER, SEND-OUT TO REF LAB)  COMPREHENSIVE METABOLIC PANEL  BRAIN NATRIURETIC PEPTIDE  TROPONIN I  CBC WITH DIFFERENTIAL/PLATELET  PROCALCITONIN  SEDIMENTATION RATE   ____________________________________________  EKG  EKG read and interpreted by me shows paced rhythm heart rate of 89 normal axis no acute ST-T changes ____________________________________________  RADIOLOGY  ED MD interpretation: Chest x-ray read by radiology shows possible increased vascular markings.  I reviewed the film  Official radiology report(s): Dg Chest Portable 1 View  Result Date: 11/17/2018 CLINICAL DATA:  33 year old male with shortness of breath, wheezing, lower extremity swelling. EXAM: PORTABLE CHEST 1 VIEW COMPARISON:  11/06/2018 and earlier. FINDINGS: Portable AP upright view at 2150 hours. Stable lung volumes. Mediastinal contours remain normal. Visualized tracheal air column is within normal limits. No pneumothorax, pleural effusion or consolidation. Symmetric increased pulmonary vascularity. Paucity of bowel gas. No acute osseous abnormality identified. IMPRESSION: Increased pulmonary vascularity. Consider mild or developing interstitial edema. Normal heart size.  No pleural effusion. Electronically Signed   By: Odessa Fleming M.D.   On: 11/17/2018 22:40    ____________________________________________   PROCEDURES   Procedure(s) performed (including Critical Care):  Procedures   ____________________________________________   INITIAL IMPRESSION / ASSESSMENT AND PLAN / ED COURSE  I will sign the patient out to Dr. Lenard Lance.              ____________________________________________   FINAL CLINICAL IMPRESSION(S) / ED DIAGNOSES  Final diagnoses:  Wheezing  Edema, unspecified type     ED Discharge Orders    None       Note:  This document was prepared using Dragon voice recognition software and may include unintentional dictation errors.    Arnaldo Natal, MD 11/17/18 [phone]

## 2018-11-17 NOTE — ED Notes (Signed)
EKG completed. Coronavirus test sent to lab.

## 2018-11-17 NOTE — ED Notes (Signed)
Pt states he uses "drugs" sometimes and states he also takes methadone. States he has not changed the amount or type of drugs he uses. States has not been able to quit using yet but is trying to.

## 2018-11-17 NOTE — ED Notes (Signed)
Patient Ultrasound at [facility]

## 2018-11-17 NOTE — ED Triage Notes (Signed)
Pt c/o bilateral lower extremity swelling as well as discolored areas. Pt denies pain to areas. Pt recently seen at [facility] ER x2 weeks ago for cough/congestion, diagnosed with URI, sent home with prednisone, zpac and inhaler. Pt still having URI symptoms and reports worsening symptoms. Pt denies fever. Area to lower legs and feet are swollen/edemadous as well as red and brown areas that extend up legs.

## 2018-11-17 NOTE — ED Notes (Signed)
Gray/Grn/Lav collected and sent to lab.

## 2018-11-17 NOTE — ED Notes (Addendum)
Pt has brown/red spots to top of feet and up calves bilaterally. Swelling that pt stated started about a month ago. States they sometimes feel numb. Denies pain. Edema 2+ in ankles. Pt recently seen at [facility] for SOB. States SOB and cough worse. Has been on prednisone. Pt states smokes "2-3 daily." Wheezing in all lobes.

## 2018-11-18 ENCOUNTER — Telehealth (INDEPENDENT_AMBULATORY_CARE_PROVIDER_SITE_OTHER): Payer: 59 | Admitting: Family Medicine

## 2018-11-18 DIAGNOSIS — J4540 Moderate persistent asthma, uncomplicated: Secondary | ICD-10-CM

## 2018-11-18 DIAGNOSIS — R600 Localized edema: Secondary | ICD-10-CM | POA: Insufficient documentation

## 2018-11-18 LAB — BRAIN NATRIURETIC PEPTIDE: B Natriuretic Peptide: 15 pg/mL (ref 0.0–100.0)

## 2018-11-18 LAB — PROCALCITONIN: Procalcitonin: <0.1 ng/mL

## 2018-11-18 MED ORDER — ALBUTEROL SULFATE HFA 108 (90 BASE) MCG/ACT IN AERS: 2.0000 | INHALATION_SPRAY | Freq: Four times a day (QID) | RESPIRATORY_TRACT | 3 refills | Status: DC | PRN

## 2018-11-18 MED ORDER — FUROSEMIDE 20 MG PO TABS: 20.0000 mg | ORAL_TABLET | Freq: Every day | ORAL | 0 refills | Status: DC

## 2018-11-18 MED ORDER — BUDESONIDE-FORMOTEROL FUMARATE 160-4.5 MCG/ACT IN AERO: 2.0000 | INHALATION_SPRAY | Freq: Two times a day (BID) | RESPIRATORY_TRACT | 3 refills | Status: DC

## 2018-11-18 NOTE — Progress Notes (Signed)
Pt was evaluated in the ER yesterday and states his breathing is not better. He states he still is having SHOB and swelling. Pt states the ER gave him lasix for the swelling but he feels worst. He was told to follow-up today with PCP.

## 2018-11-18 NOTE — Progress Notes (Signed)
Telemedicine Encounter- SOAP NOTE Established Patient  I discussed the limitations, risks, security and privacy concerns of performing an evaluation and management service by telephone and the availability of in person appointments. I also discussed with the patient that there may be a patient responsible charge related to this service. The patient expressed understanding and agreed to proceed.  This telephone encounter was conducted with the patient's (or proxy's) verbal consent via audio telecommunications: yes Patient was instructed to have this encounter in a suitably private space; and to only have persons present to whom they give permission to participate. In addition, patient identity was confirmed by use of name plus two identifiers (DOB and address).  I spent a total of 20 min talking with the patient   Pt was evaluated in the ER yesterday and states his breathing is not better. He states he still is having SHOB and swelling. Pt states the ER gave him lasix for the swelling but he feels worst. He was told to follow-up today with PCP.   Subjective   Marc Henderson is a 33 y.o. male established patient. Telephone visit today for cough/SOB/LE edema  HPI Pt with asthma noted onset of symptoms 2 weeks ago-cough/SOB. Pt seen at ER 4/16 with diagnosis of asthma exacerbation. Pt states he took zpack -completed entire pack and continues to use nebulizer as directed every 4-6 hours. Pt was not able to purchase advair as to expensive. Pt is not aware of alternative covered at a less expensive rate on insurance plan. Pt states he was concerned yesterday upon waking when he noted LE edema. Pt states he went to ER for evaluation. ER note states LE doppler showed no clots and labwork was normal. Repeat cxr showed-ncreased pulmonary vascularity. Consider mild or developing interstitial edema. Normal heart size.  No pleural effusion and pt was prescribed lasix. Pt has taken his first pill this  morning and states he has urinated 3 times this morning. Pt was advised to call cardiology for an appointment including an echo. Pt states he will call for the appointment as recommended. Pt has never been diagnosed with a heart condition. Pt states he experienced palpitations.  Pt with no h/o of vascular problems. Pt is out of MDI but has plenty of nebulizer solution. COVID testing pending from last ER visit Patient Active Problem List   Diagnosis Date Noted  . Hypoxia   . Tobacco use disorder   . Alcohol abuse   . Chronic low back pain   . Asthma exacerbation 10/02/2017  . Acute bilateral low back pain with bilateral sciatica 08/27/2017  . Need for prophylactic vaccination and inoculation against influenza 04/24/2017  . Moderate persistent asthma without complication 04/24/2017    Past Medical History:  Diagnosis Date  . Asthma     Current Outpatient Medications  Medication Sig Dispense Refill  . albuterol (PROAIR HFA) 108 (90 Base) MCG/ACT inhaler Inhale 2 puffs into the lungs every 6 (six) hours as needed for wheezing or shortness of breath. Appointment needed for more refills. 8.5 Inhaler 3  . albuterol (PROVENTIL) (2.5 MG/3ML) 0.083% nebulizer solution Take 3 mLs (2.5 mg total) by nebulization every 6 (six) hours as needed for up to 30 days for wheezing or shortness of breath. 75 mL 12  . azithromycin (ZITHROMAX) 250 MG tablet Take 2 po today then 1 po day 2-5 6 tablet 0  . Fluticasone-Salmeterol (ADVAIR DISKUS) 250-50 MCG/DOSE AEPB Inhale 1 puff into the lungs 2 (two) times daily. 1  each 3  . furosemide (LASIX) 20 MG tablet Take 1 tablet (20 mg total) by mouth daily for 14 days. 14 tablet 0   No current facility-administered medications for this visit.     No Known Allergies  Social History   Socioeconomic History  . Marital status: Married    Spouse name: Not on file  . Number of children: Not on file  . Years of education: Not on file  . Highest education level: Not on  file  Occupational History  . Not on file  Social Needs  . Financial resource strain: Not on file  . Food insecurity:    Worry: Not on file    Inability: Not on file  . Transportation needs:    Medical: Not on file    Non-medical: Not on file  Tobacco Use  . Smoking status: Current Every Day Smoker    Packs/day: 1.00    Years: 8.00    Pack years: 8.00  . Smokeless tobacco: Never Used  Substance and Sexual Activity  . Alcohol use: Yes    Comment: 1-2 beer/daily  . Drug use: No  . Sexual activity: Not on file  Lifestyle  . Physical activity:    Days per week: Not on file    Minutes per session: Not on file  . Stress: Not on file  Relationships  . Social connections:    Talks on phone: Not on file    Gets together: Not on file    Attends religious service: Not on file    Active member of club or organization: Not on file    Attends meetings of clubs or organizations: Not on file    Relationship status: Not on file  . Intimate partner violence:    Fear of current or ex partner: Not on file    Emotionally abused: Not on file    Physically abused: Not on file    Forced sexual activity: Not on file  Other Topics Concern  . Not on file  Social History Narrative  . Not on file    ROS CONSTITUTIONAL: no fever or night sweats EENT:nasal congestion CV: swelling of the legs  RESP: SOB, cough with sputum production, oxygen at home,  prior abnormal chest xray-ER last night  Objective   Vitals as reported by the patient:none-pt did not cough during conversation or have SOB that required stopping discussion.  1. Moderate persistent asthma without complication Trial of symbicort as pt could not afford advair-? Not on formulary-agreed to trial of inhaled steroids after using albuterol nebulize - albuterol (PROAIR HFA) 108 (90 Base) MCG/ACT inhaler; Inhale 2 puffs into the lungs every 6 (six) hours as needed for wheezing or shortness of breath. Appointment needed for more  refills.  Dispense: 8.5 Inhaler; Refill: 3  2. Lower extremity edema Lasix daily recommended/prescribed by ER-continue to take daily-reviewed cxr report-pt will make appt with cardiology as ER recommended to include echo. NO PRIOR H/O HEART DISEASE I discussed the assessment and treatment plan with the patient. The patient was provided an opportunity to ask questions and all were answered. The patient agreed with the plan and demonstrated an understanding of the instructions.   The patient was advised to call back or seek an in-person evaluation if the symptoms worsen or if the condition fails to improve as anticipated.  I provided 20 minutes of non-face-to-face time during this encounter.  Libra Gatz Mat CarneLEIGH Hershell Brandl, MD  [facility] 11-18-18

## 2018-11-18 NOTE — ED Provider Notes (Signed)
-----------------------------------------   12:16 AM on 11/18/2018 -----------------------------------------  Patient care assumed from Dr. Darnelle Catalan.  Patient's work-up shows mild interstitial edema on his chest x-ray, lower extremity edema but no signs of DVT on ultrasound.  Labs are largely reassuring, troponin is negative.  Patient has been tested for coronavirus which is pending at this time.  Overall the patient appears well satting between 9497% on room air.  Given his chest x-ray findings and lower extremity edema we will start the patient on Lasix 40 mg daily for the next 7 days.  Patient will require cardiology follow-up for an echocardiogram.  Patient agreeable to plan of care.  Discussed return precautions.  EKG viewed and interpreted by myself shows a normal sinus rhythm at 89 bpm with a narrow QRS, normal axis, normal intervals, no ST changes.  Reassuring EKG.   Minna Antis, MD 11/18/18 [phone]

## 2018-11-19 ENCOUNTER — Telehealth: Payer: Self-pay | Admitting: Family Medicine

## 2018-11-19 LAB — NOVEL CORONAVIRUS, NAA (HOSP ORDER, SEND-OUT TO REF LAB; TAT 18-24 HRS): SARS-CoV-2, NAA: NOT DETECTED

## 2018-11-19 NOTE — Telephone Encounter (Signed)
Copied from CRM [id number]. Topic: Quick Communication - See Telephone Encounter >> Nov 19, 2018  2:36 PM Debroah Loop wrote: CRM for notification. See Telephone encounter for: 11/19/18. Patient needs something else called in to replace budesonide-formoterol (SYMBICORT) 160-4.5 MCG/ACT inhaler because its over $200.

## 2018-11-20 ENCOUNTER — Other Ambulatory Visit: Payer: Self-pay

## 2018-11-20 ENCOUNTER — Emergency Department: Payer: 59

## 2018-11-20 ENCOUNTER — Ambulatory Visit: Payer: Self-pay

## 2018-11-20 ENCOUNTER — Emergency Department
Admission: EM | Admit: 2018-11-20 | Discharge: 2018-11-20 | Disposition: A | Discharge status: Home / Self Care | Payer: 59 | Attending: Emergency Medicine | Admitting: Emergency Medicine

## 2018-11-20 DIAGNOSIS — Z79899 Other long term (current) drug therapy: Secondary | ICD-10-CM | POA: Diagnosis not present

## 2018-11-20 DIAGNOSIS — F17210 Nicotine dependence, cigarettes, uncomplicated: Secondary | ICD-10-CM | POA: Insufficient documentation

## 2018-11-20 DIAGNOSIS — J4541 Moderate persistent asthma with (acute) exacerbation: Secondary | ICD-10-CM | POA: Diagnosis not present

## 2018-11-20 DIAGNOSIS — R079 Chest pain, unspecified: Secondary | ICD-10-CM | POA: Diagnosis present

## 2018-11-20 LAB — COMPREHENSIVE METABOLIC PANEL
ALT: 48 U/L — ABNORMAL HIGH (ref 0–44)
AST: 31 U/L (ref 15–41)
Albumin: 4.2 g/dL (ref 3.5–5.0)
Alkaline Phosphatase: 60 U/L (ref 38–126)
Anion gap: 9 (ref 5–15)
BUN: 12 mg/dL (ref 6–20)
CO2: 25 mmol/L (ref 22–32)
Calcium: 9.2 mg/dL (ref 8.9–10.3)
Chloride: 104 mmol/L (ref 98–111)
Creatinine, Ser: 0.99 mg/dL (ref 0.61–1.24)
GFR calc Af Amer: >60 mL/min (ref >60)
GFR calc non Af Amer: >60 mL/min (ref >60)
Glucose, Bld: 110 mg/dL — ABNORMAL HIGH (ref 70–99)
Potassium: 3.3 mmol/L — ABNORMAL LOW (ref 3.5–5.1)
Sodium: 138 mmol/L (ref 135–145)
Total Bilirubin: 0.7 mg/dL (ref 0.3–1.2)
Total Protein: 7.7 g/dL (ref 6.5–8.1)

## 2018-11-20 LAB — CBC
HCT: 41.6 % (ref 39.0–52.0)
Hemoglobin: 14.7 g/dL (ref 13.0–17.0)
MCH: 28.9 pg (ref 26.0–34.0)
MCHC: 35.3 g/dL (ref 30.0–36.0)
MCV: 81.9 fL (ref 80.0–100.0)
Platelets: 281 10*3/uL (ref 150–400)
RBC: 5.08 MIL/uL (ref 4.22–5.81)
RDW: 12.1 % (ref 11.5–15.5)
WBC: 9.2 10*3/uL (ref 4.0–10.5)
nRBC: 0 % (ref 0.0–0.2)

## 2018-11-20 LAB — TROPONIN I: Troponin I: <0.03 ng/mL (ref <0.03)

## 2018-11-20 MED ORDER — IPRATROPIUM-ALBUTEROL 0.5-2.5 (3) MG/3ML IN SOLN
3.0000 mL | Freq: Once | RESPIRATORY_TRACT | Status: AC
  Administered 2018-11-20: 3 mL via RESPIRATORY_TRACT
  Filled 2018-11-20: qty 3

## 2018-11-20 MED ORDER — PREDNISONE 20 MG PO TABS: 40.0000 mg | ORAL_TABLET | Freq: Every day | ORAL | 0 refills | Status: DC

## 2018-11-20 NOTE — Telephone Encounter (Signed)
Please see note below and replace if possible.

## 2018-11-20 NOTE — Telephone Encounter (Signed)
Who prescribed Symbicort?

## 2018-11-20 NOTE — Telephone Encounter (Signed)
Please see note below needs alternate.

## 2018-11-20 NOTE — Telephone Encounter (Signed)
This is the patient I was concerned about. If he is unable to afford inhaled steroids and still having difficulty breathing, we need to look at other options.  Please let me know what he decides and how he is feeling

## 2018-11-20 NOTE — ED Notes (Addendum)
Rainbow blood tubes and set of blood cultures drawn and sent to the lab.

## 2018-11-20 NOTE — Telephone Encounter (Signed)
Pt. Called to report continued shortness of breath, cough, and swelling in bilat. Lower legs.  Stated is short of breath at rest and with activity.  Stated the shortness of breath and cough have remained the same since he was seen in ER on 4/27.  Reported new onset warmth of left lateral leg; denied redness of the localized warm area.  C/o mid to left chest tightness.  Pt. Was discussing need for alternate medication to the Symbicort, due to the expense.  He suddenly started having resp. difficulty, very anxious, and requested to call 911.  Stated "I think I am having a heart attack."  Immediately called 911, and the dispatcher was connected to the patient.  She stated that EMS has been dispatched to his home.     Reason for Disposition . Difficulty breathing at rest  Answer Assessment - Initial Assessment Questions 1. ONSET: "When did the swelling start?" (e.g., minutes, hours, days)     About a month 2. LOCATION: "What part of the leg is swollen?"  "Are both legs swollen or just one leg?"     Feet to lower calf with left > right ; left ankle swelling has increased since 4/27 3. SEVERITY: "How bad is the swelling?" (e.g., localized; mild, moderate, severe)  - Localized - small area of swelling localized to one leg  - MILD pedal edema - swelling limited to foot and ankle, pitting edema < 1/4 inch (6 mm) deep, rest and elevation eliminate most or all swelling  - MODERATE edema - swelling of lower leg to knee, pitting edema > 1/4 inch (6 mm) deep, rest and elevation only partially reduce swelling  - SEVERE edema - swelling extends above knee, facial or hand swelling present      Moderate from feet to mid calf 4. REDNESS: "Does the swelling look red or infected?"     Blotchy red-brown color to legs; this has been present ; it has been discolored this way; left side left lower leg has increased warmth and soreness, since Tuesday night  5. PAIN: "Is the swelling painful to touch?" If so, ask: "How  painful is it?"   (Scale 1-10; mild, moderate or severe)     Denied  6. FEVER: "Do you have a fever?" If so, ask: "What is it, how was it measured, and when did it start?"      Denied fever/ chills  7. CAUSE: "What do you think is causing the leg swelling?"     Unknown; was told he may have CHF 8. MEDICAL HISTORY: "Do you have a history of heart failure, kidney disease, liver failure, or cancer?"     Asthma; otherwise has been healthy;  9. RECURRENT SYMPTOM: "Have you had leg swelling before?" If so, ask: "When was the last time?" "What happened that time?"     Denied previous swelling  10. OTHER SYMPTOMS: "Do you have any other symptoms?" (e.g., chest pain, difficulty breathing)       Coughing up clear to green phlegm; shortness of breath with rest and activity; stated this is the same as it has been since 4/27; c/o mid to left chest tightness  11. PREGNANCY: "Is there any chance you are pregnant?" "When was your last menstrual period?"       n/a  Protocols used: LEG SWELLING AND EDEMA-A-AH

## 2018-11-20 NOTE — Telephone Encounter (Addendum)
Relation to pt: self Call back number: [phone] Pharmacy: [facility] [address], [place] - [name] [phone] (Phone) [phone] (Fax)     Reason for call:  Patient Telemedicine Encounter with Dr. Judee Clara 11/18/2018 who prescribed budesonide-formoterol (SYMBICORT) 160-4.5 MCG/ACT inhaler , patient seeking alternate, please advise (transfer patient to nurse triage due to patient experiencing shortness of breath)

## 2018-11-20 NOTE — ED Provider Notes (Signed)
[facility] Emergency Department Provider Note  Time seen: 1:25 PM  I have reviewed the triage vital signs and the nursing notes.   HISTORY  Chief Complaint Shortness of Breath    HPI Marc Henderson is a 33 y.o. male with a past medical history of asthma, presents to the emergency department for chest pain.  According to the patient he was having a telemetry visit with his primary care doctor when he developed a sudden sharp chest pain.  Patient states pain lasted several seconds and then went away.  Has been somewhat intermittent since but very mild.  Patient does state slight shortness of breath, noted to have wheeze by EMS and given breathing treatments as well as Solu-Medrol prior to arrival.  Patient was seen by myself several days ago in the emergency department for shortness of breath, at that time Lasix was prescribed due to interstitial edema on the chest x-ray and lower extremity edema.  Patient's lower extreme edema has improved considerably since discharge.  Patient denies any fever.  States minimal cough.  Denies any chest pain currently.   Past Medical History:  Diagnosis Date  . Asthma     Patient Active Problem List   Diagnosis Date Noted  . Lower extremity edema 11/18/2018  . Hypoxia   . Tobacco use disorder   . Alcohol abuse   . Chronic low back pain   . Asthma exacerbation 10/02/2017  . Acute bilateral low back pain with bilateral sciatica 08/27/2017  . Need for prophylactic vaccination and inoculation against influenza 04/24/2017  . Moderate persistent asthma without complication 04/24/2017    History reviewed. No pertinent surgical history.  Prior to Admission medications   Medication Sig Start Date End Date Taking? Authorizing Provider  albuterol (PROAIR HFA) 108 (90 Base) MCG/ACT inhaler Inhale 2 puffs into the lungs every 6 (six) hours as needed for wheezing or shortness of breath. Appointment needed for more refills. 11/18/18    Corum, Minerva Fester, MD  albuterol (PROVENTIL) (2.5 MG/3ML) 0.083% nebulizer solution Take 3 mLs (2.5 mg total) by nebulization every 6 (six) hours as needed for up to 30 days for wheezing or shortness of breath. 11/11/18 12/11/18  Wandra Feinstein, MD  azithromycin (ZITHROMAX) 250 MG tablet Take 2 po today then 1 po day 2-5 11/11/18   Corum, Minerva Fester, MD  budesonide-formoterol (SYMBICORT) 160-4.5 MCG/ACT inhaler Inhale 2 puffs into the lungs 2 (two) times daily. 11/18/18   Corum, Minerva Fester, MD  furosemide (LASIX) 20 MG tablet Take 1 tablet (20 mg total) by mouth daily for 14 days. 11/18/18 12/02/18  Minna Antis, MD    No Known Allergies  No family history on file.  Social History Social History   Tobacco Use  . Smoking status: Current Every Day Smoker    Packs/day: 1.00    Years: 8.00    Pack years: 8.00  . Smokeless tobacco: Never Used  Substance Use Topics  . Alcohol use: Yes    Comment: 1-2 beer/daily  . Drug use: No    Review of Systems Constitutional: Negative for fever. Cardiovascular: Positive for chest pain, now resolved Respiratory: Mild shortness of breath.  Positive for wheeze. Gastrointestinal: Negative for abdominal pain Musculoskeletal: States he was having some discomfort in his left leg the past few days but denies any currently. Skin: Negative for skin complaints  Neurological: Negative for headache All other ROS negative  ____________________________________________   PHYSICAL EXAM:  VITAL SIGNS: ED Triage Vitals [11/20/18 1239]  Enc Vitals Group     BP 101/68     Pulse Rate 85     Resp (!) 24     Temp 98.2 F (36.8 C)     Temp Source Oral     SpO2 100 %     Weight 250 lb (113.4 kg)     Height 5\' 11"  (1.803 m)     Head Circumference      Peak Flow      Pain Score 0     Pain Loc      Pain Edu?      Excl. in GC?     Constitutional: Alert and oriented.  Overall well-appearing but somewhat anxious appearing on exam. Eyes: Normal exam ENT      Head:  Normocephalic and atraumatic.      Mouth/Throat: Mucous membranes are moist. Cardiovascular: Normal rate, regular rhythm Respiratory: Mild tachypnea, with mild expiratory wheeze bilaterally Gastrointestinal: Soft and nontender. No distention.  Musculoskeletal: Nontender with normal range of motion in all extremities.  Very minimal pedal edema.  No tibial edema Neurologic:  Normal speech and language. No gross focal neurologic deficits  Skin:  Skin is warm, dry and intact.  Psychiatric: Mood and affect are normal. ____________________________________________    EKG  EKG viewed and interpreted by myself shows a normal sinus rhythm 84 bpm with a narrow QRS, normal axis, normal intervals, no concerning ST changes.  ____________________________________________    RADIOLOGY  Chest x-ray negative  ____________________________________________   INITIAL IMPRESSION / ASSESSMENT AND PLAN / ED COURSE  Pertinent labs & imaging results that were available during my care of the patient were reviewed by me and considered in my medical decision making (see chart for details).   Patient presents to the emergency department for chest pain occurring approximately 2 hours ago.  Has not experienced significant chest pain since but does state intermittent very mild chest discomfort.  Denies any discomfort at all currently.  Denies any fever.  Does state mild cough.  Patient was placed on Lasix during his last ED visit his lower extremities appear much improved.  We will recheck labs, chest x-ray.  Patient does state intermittent left leg pain over the past few days however on 11/17/2018 patient had a negative ultrasound performed, as well as a negative corona test during that visit.  We will dose duo nebs in the emergency department, patient received Solu-Medrol.  Suspect chest discomfort likely due to reactive airway disease as the patient has mild to moderate wheeze on exam.  We will check labs including  cardiac enzymes.  No concerning findings on EKG.  Work-up is reassuring including a negative troponin.  Chest x-ray is negative.  Patient appears improved after taking Lasix for several days.  I believe the patient is safe for discharge with cardiology follow-up.  Patient agreeable to plan of care.  Regis BillDaniel E Ysaguirre was evaluated in Emergency Department on 11/20/2018 for the symptoms described in the history of present illness. He was evaluated in the context of the global COVID-19 pandemic, which necessitated consideration that the patient might be at risk for infection with the SARS-CoV-2 virus that causes COVID-19. Institutional protocols and algorithms that pertain to the evaluation of patients at risk for COVID-19 are in a state of rapid change based on information released by regulatory bodies including the CDC and federal and state organizations. These policies and algorithms were followed during the patient's care in the ED.  ____________________________________________   FINAL CLINICAL IMPRESSION(S) /  ED DIAGNOSES  Chest pain   Minna Antis, MD 11/20/18 1406

## 2018-11-20 NOTE — Telephone Encounter (Signed)
Please call pt to see what is available on his formulary-pt was unable to fill advair too. Can use Dulera if pt has formulary coverage. Need to check on pt for SOB/cough

## 2018-11-20 NOTE — ED Triage Notes (Signed)
Arrives to ER via [facility] from home c/o SOB for approx 2 weeks. Worsening today. Has tested negative for COVID. Pt started on lasix out of ER from last appt, has appt to followup for cardiologist. Pt anxious upon arrival and placed on NRB. No hypoxia documents. No fever. Clear nasal congestion. Took 4 puffs albuterol and 125mg  solumedrol IVP prior to arrival to ER.

## 2018-11-21 NOTE — Telephone Encounter (Signed)
Pt went to ED 04/30.  Reports chest pain at that time which he now believes was a panic attack.  Improvement in CP since that visit.  Reports continued breathing s/s as well as swelling in legs and feet.  Has been taking Lasix.  Has also been using Symicort which he obtained from mother.  Nex tx x1 yesterday and x2 today.  Nebs help x 45 min.  Discussed potential for other meds to be Rx but pt states he wants to hold off at this time and hopes to see improvement with Lasix and Symbicort.  Advised to seek medical attention if symptoms worsen over weekend.  On Monday, if he has additional needs or wants to discuss other meds, contact office for telemed with Dr. Judee Clara. Pt verbalizes understanding and denies further needs at this time.

## 2019-02-25 ENCOUNTER — Encounter: Payer: Self-pay | Admitting: Emergency Medicine

## 2019-02-25 ENCOUNTER — Other Ambulatory Visit: Payer: Self-pay

## 2019-02-25 ENCOUNTER — Telehealth (INDEPENDENT_AMBULATORY_CARE_PROVIDER_SITE_OTHER): Payer: 59 | Admitting: Emergency Medicine

## 2019-02-25 DIAGNOSIS — Z20822 Contact with and (suspected) exposure to covid-19: Secondary | ICD-10-CM

## 2019-02-25 DIAGNOSIS — J4540 Moderate persistent asthma, uncomplicated: Secondary | ICD-10-CM | POA: Diagnosis not present

## 2019-02-25 DIAGNOSIS — Z20828 Contact with and (suspected) exposure to other viral communicable diseases: Secondary | ICD-10-CM | POA: Diagnosis not present

## 2019-02-25 MED ORDER — PREDNISONE 20 MG PO TABS: 40.0000 mg | ORAL_TABLET | Freq: Every day | ORAL | 0 refills | Status: AC

## 2019-02-25 MED ORDER — ALBUTEROL SULFATE HFA 108 (90 BASE) MCG/ACT IN AERS: 2.0000 | INHALATION_SPRAY | Freq: Four times a day (QID) | RESPIRATORY_TRACT | 5 refills | Status: DC | PRN

## 2019-02-25 MED ORDER — BENZONATATE 200 MG PO CAPS: 200.0000 mg | ORAL_CAPSULE | Freq: Two times a day (BID) | ORAL | 0 refills | Status: DC | PRN

## 2019-02-25 NOTE — Progress Notes (Signed)
Telemedicine Encounter- SOAP NOTE Established Patient  This telephone encounter was conducted with the patient's (or proxy's) verbal consent via audio telecommunications: yes/no: Yes Patient was instructed to have this encounter in a suitably private space; and to only have persons present to whom they give permission to participate. In addition, patient identity was confirmed by use of name plus two identifiers (DOB and address).  I discussed the limitations, risks, security and privacy concerns of performing an evaluation and management service by telephone and the availability of in person appointments. I also discussed with the patient that there may be a patient responsible charge related to this service. The patient expressed understanding and agreed to proceed.    No chief complaint on file. Shortness of breath and coughing  Subjective   Marc Henderson is a 33 y.o. male established patient. Telephone visit today complaining of shortness of breath and coughing.  Has a history of asthma.  Has had increase shortness of breath with cough productive of clear phlegm for the past 1 to 2 weeks.  May have been exposed to [place] patient at work.  Denies fever or chills.  Has been using mother's Symbicort.  HPI   Patient Active Problem List   Diagnosis Date Noted  . Lower extremity edema 11/18/2018  . Tobacco use disorder   . Alcohol abuse   . Chronic low back pain   . Asthma exacerbation 10/02/2017  . Moderate persistent asthma without complication 82/50/5397    Past Medical History:  Diagnosis Date  . Asthma     Current Outpatient Medications  Medication Sig Dispense Refill  . albuterol (PROAIR HFA) 108 (90 Base) MCG/ACT inhaler Inhale 2 puffs into the lungs every 6 (six) hours as needed for wheezing or shortness of breath. Appointment needed for more refills. 8.5 Inhaler 3  . albuterol (PROVENTIL) (2.5 MG/3ML) 0.083% nebulizer solution Take 3 mLs (2.5 mg total) by  nebulization every 6 (six) hours as needed for up to 30 days for wheezing or shortness of breath. 75 mL 12  . budesonide-formoterol (SYMBICORT) 160-4.5 MCG/ACT inhaler Inhale 2 puffs into the lungs 2 (two) times daily. (Patient not taking: Reported on 11/20/2018) 1 Inhaler 3  . furosemide (LASIX) 20 MG tablet Take 1 tablet (20 mg total) by mouth daily for 14 days. 14 tablet 0   No current facility-administered medications for this visit.     No Known Allergies  Social History   Socioeconomic History  . Marital status: Married    Spouse name: Not on file  . Number of children: Not on file  . Years of education: Not on file  . Highest education level: Not on file  Occupational History  . Not on file  Social Needs  . Financial resource strain: Not on file  . Food insecurity    Worry: Not on file    Inability: Not on file  . Transportation needs    Medical: Not on file    Non-medical: Not on file  Tobacco Use  . Smoking status: Current Every Day Smoker    Packs/day: 1.00    Years: 8.00    Pack years: 8.00  . Smokeless tobacco: Never Used  Substance and Sexual Activity  . Alcohol use: Yes    Comment: 1-2 beer/daily  . Drug use: No  . Sexual activity: Not on file  Lifestyle  . Physical activity    Days per week: Not on file    Minutes per session: Not on file  .  Stress: Not on file  Relationships  . Social Musicianconnections    Talks on phone: Not on file    Gets together: Not on file    Attends religious service: Not on file    Active member of club or organization: Not on file    Attends meetings of clubs or organizations: Not on file    Relationship status: Not on file  . Intimate partner violence    Fear of current or ex partner: Not on file    Emotionally abused: Not on file    Physically abused: Not on file    Forced sexual activity: Not on file  Other Topics Concern  . Not on file  Social History Narrative  . Not on file    Review of Systems  Constitutional:  Negative.  Negative for chills and fever.  HENT: Negative for congestion and sore throat.   Eyes: Negative.   Respiratory: Positive for cough, shortness of breath and wheezing. Negative for hemoptysis and sputum production.   Cardiovascular: Negative for chest pain and palpitations.  Gastrointestinal: Negative.  Negative for abdominal pain, nausea and vomiting.  Genitourinary: Negative.  Negative for dysuria.  Skin: Negative.  Negative for rash.  Neurological: Negative for dizziness and headaches.  Endo/Heme/Allergies: Negative.   All other systems reviewed and are negative.   Objective  Alert and oriented x3 in no respiratory distress while talking to me on the phone Vitals as reported by the patient: There were no vitals filed for this visit.  There are no diagnoses linked to this encounter. Diagnoses and all orders for this visit:  Moderate persistent asthma without complication -     albuterol (PROAIR HFA) 108 (90 Base) MCG/ACT inhaler; Inhale 2 puffs into the lungs every 6 (six) hours as needed for wheezing or shortness of breath. -     predniSONE (DELTASONE) 20 MG tablet; Take 2 tablets (40 mg total) by mouth daily with breakfast for 5 days. -     Novel Coronavirus, NAA ([facility]) -     benzonatate (TESSALON) 200 MG capsule; Take 1 capsule (200 mg total) by mouth 2 (two) times daily as needed for cough.  Close Exposure to Covid-19 Virus     I discussed the assessment and treatment plan with the patient. The patient was provided an opportunity to ask questions and all were answered. The patient agreed with the plan and demonstrated an understanding of the instructions.   The patient was advised to call back or seek an in-person evaluation if the symptoms worsen or if the condition fails to improve as anticipated.    Georgina QuintMiguel Jose Jadon Ressler, MD  [facility]

## 2019-02-25 NOTE — Progress Notes (Signed)
Pt is wanting to get tested for covid. It was announced at is job that one of the fellow workers did test positive and he was exposed. Since, he has been having continuous coughing and sob. Pt is also asthmatic. He's used 2 inhalers in the past 2 wks and is requesting another rx for the albuterol proair. He is also using the symbicort that was given to him by his mother for relief. The coughing is starting to cause back pain. Pharmacy and medication have been verified.

## 2019-02-26 ENCOUNTER — Other Ambulatory Visit: Payer: Self-pay

## 2019-02-26 DIAGNOSIS — Z20822 Contact with and (suspected) exposure to covid-19: Secondary | ICD-10-CM

## 2019-02-28 LAB — SPECIMEN STATUS REPORT

## 2019-02-28 LAB — NOVEL CORONAVIRUS, NAA: SARS-CoV-2, NAA: NOT DETECTED

## 2019-03-11 ENCOUNTER — Encounter: Payer: Self-pay | Admitting: Emergency Medicine

## 2019-03-11 ENCOUNTER — Telehealth (INDEPENDENT_AMBULATORY_CARE_PROVIDER_SITE_OTHER): Payer: 59 | Admitting: Emergency Medicine

## 2019-03-11 ENCOUNTER — Other Ambulatory Visit: Payer: Self-pay

## 2019-03-11 VITALS — Ht 71.0 in | Wt 270.0 lb

## 2019-03-11 DIAGNOSIS — J4540 Moderate persistent asthma, uncomplicated: Secondary | ICD-10-CM

## 2019-03-11 DIAGNOSIS — J988 Other specified respiratory disorders: Secondary | ICD-10-CM | POA: Diagnosis not present

## 2019-03-11 DIAGNOSIS — B9789 Other viral agents as the cause of diseases classified elsewhere: Secondary | ICD-10-CM | POA: Diagnosis not present

## 2019-03-11 NOTE — Progress Notes (Signed)
Trying to go back to work.   Still having Cough and phelm- 2 months

## 2019-03-11 NOTE — Progress Notes (Signed)
Telemedicine Encounter- SOAP NOTE Established Patient  This telephone encounter was conducted with the patient's (or proxy's) verbal consent via audio telecommunications: yes/no: Yes Patient was instructed to have this encounter in a suitably private space; and to only have persons present to whom they give permission to participate. In addition, patient identity was confirmed by use of name plus two identifiers (DOB and address).  I discussed the limitations, risks, security and privacy concerns of performing an evaluation and management service by telephone and the availability of in person appointments. I also discussed with the patient that there may be a patient responsible charge related to this service. The patient expressed understanding and agreed to proceed.  I spent a total of TIME; 0 MIN TO 60 MIN: 15 minutes talking with the patient or their proxy.  No chief complaint on file. Follow-up visit from 02/25/2019  Subjective   Marc Henderson is a 33 y.o. male established patient. Telephone visit today for follow-up of flulike symptoms.  80% better.  Still has some mild nonproductive cough.  Feels well enough to go back to work.  Needs work note.  COVID test was negative.  HPI   Patient Active Problem List   Diagnosis Date Noted  . Lower extremity edema 11/18/2018  . Tobacco use disorder   . Alcohol abuse   . Chronic low back pain   . Asthma exacerbation 10/02/2017  . Moderate persistent asthma without complication 09/38/1829    Past Medical History:  Diagnosis Date  . Asthma     Current Outpatient Medications  Medication Sig Dispense Refill  . albuterol (PROAIR HFA) 108 (90 Base) MCG/ACT inhaler Inhale 2 puffs into the lungs every 6 (six) hours as needed for wheezing or shortness of breath. 18 g 5  . albuterol (PROVENTIL) (2.5 MG/3ML) 0.083% nebulizer solution Take 3 mLs (2.5 mg total) by nebulization every 6 (six) hours as needed for up to 30 days for wheezing or  shortness of breath. 75 mL 12  . furosemide (LASIX) 20 MG tablet Take 1 tablet (20 mg total) by mouth daily for 14 days. 14 tablet 0   No current facility-administered medications for this visit.     No Known Allergies  Social History   Socioeconomic History  . Marital status: Married    Spouse name: Not on file  . Number of children: Not on file  . Years of education: Not on file  . Highest education level: Not on file  Occupational History  . Not on file  Social Needs  . Financial resource strain: Not on file  . Food insecurity    Worry: Not on file    Inability: Not on file  . Transportation needs    Medical: Not on file    Non-medical: Not on file  Tobacco Use  . Smoking status: Current Every Day Smoker    Packs/day: 1.00    Years: 8.00    Pack years: 8.00  . Smokeless tobacco: Never Used  Substance and Sexual Activity  . Alcohol use: Yes    Comment: 1-2 beer/daily  . Drug use: No  . Sexual activity: Not on file  Lifestyle  . Physical activity    Days per week: Not on file    Minutes per session: Not on file  . Stress: Not on file  Relationships  . Social Herbalist on phone: Not on file    Gets together: Not on file    Attends religious service:  Not on file    Active member of club or organization: Not on file    Attends meetings of clubs or organizations: Not on file    Relationship status: Not on file  . Intimate partner violence    Fear of current or ex partner: Not on file    Emotionally abused: Not on file    Physically abused: Not on file    Forced sexual activity: Not on file  Other Topics Concern  . Not on file  Social History Narrative  . Not on file    Review of Systems  Constitutional: Negative.  Negative for chills.  HENT: Negative.  Negative for congestion and sore throat.   Eyes: Negative.   Respiratory: Positive for cough.   Cardiovascular: Negative.  Negative for chest pain and palpitations.  Gastrointestinal: Negative.   Negative for abdominal pain, diarrhea, nausea and vomiting.  Genitourinary: Negative for dysuria.  Musculoskeletal: Negative.  Negative for myalgias.  Skin: Negative.  Negative for rash.  Neurological: Negative for dizziness and headaches.  All other systems reviewed and are negative.   Objective   Vitals as reported by the patient: Today's Vitals   03/11/19 1315  Weight: 270 lb (122.5 kg)  Height: 5\' 11"  (1.803 m)   Alert and oriented x3 in no apparent respiratory distress. There are no diagnoses linked to this encounter.  Diagnoses and all orders for this visit:  Moderate persistent asthma without complication Comments: Much improved  Viral respiratory infection Comments: Much improved    Much improved.  Able to return back to work without restrictions.  Work note dictated. I discussed the assessment and treatment plan with the patient. The patient was provided an opportunity to ask questions and all were answered. The patient agreed with the plan and demonstrated an understanding of the instructions.   The patient was advised to call back or seek an in-person evaluation if the symptoms worsen or if the condition fails to improve as anticipated.  I provided 15 minutes of non-face-to-face time during this encounter.  Georgina QuintMiguel Jose Lucero Ide, MD  [facility] at [facility]

## 2019-04-20 ENCOUNTER — Ambulatory Visit (INDEPENDENT_AMBULATORY_CARE_PROVIDER_SITE_OTHER): Payer: 59 | Admitting: Emergency Medicine

## 2019-04-20 ENCOUNTER — Other Ambulatory Visit: Payer: Self-pay

## 2019-04-20 ENCOUNTER — Encounter: Payer: Self-pay | Admitting: Emergency Medicine

## 2019-04-20 VITALS — BP 128/79 | HR 93 | Temp 98.4°F | Resp 16 | Ht 71.0 in | Wt 253.6 lb

## 2019-04-20 DIAGNOSIS — I872 Venous insufficiency (chronic) (peripheral): Secondary | ICD-10-CM

## 2019-04-20 DIAGNOSIS — J4540 Moderate persistent asthma, uncomplicated: Secondary | ICD-10-CM

## 2019-04-20 MED ORDER — QVAR REDIHALER 40 MCG/ACT IN AERB: 1.0000 | INHALATION_SPRAY | Freq: Two times a day (BID) | RESPIRATORY_TRACT | 5 refills | Status: DC

## 2019-04-20 NOTE — Progress Notes (Signed)
Noelle Hoogland Cadogan 33 y.o.   Chief Complaint  Patient presents with   Leg Swelling    BOTH for months, per patient color is worse - brown spots   Leg Pain    BOTH - sharp    HISTORY OF PRESENT ILLNESS: This is a 33 y.o. male complaining of chronic swelling of both legs for the past several months with some skin discoloration and intermittent cramping.  Denies injuries.  No other significant symptoms. Past medical history of asthma, not well controlled, using albuterol inhaler almost daily.  Not on maintenance therapy.  HPI   Prior to Admission medications   Medication Sig Start Date End Date Taking? Authorizing Provider  albuterol (PROAIR HFA) 108 (90 Base) MCG/ACT inhaler Inhale 2 puffs into the lungs every 6 (six) hours as needed for wheezing or shortness of breath. 02/25/19  Yes Georgina Quint, MD  albuterol (PROVENTIL) (2.5 MG/3ML) 0.083% nebulizer solution Take 3 mLs (2.5 mg total) by nebulization every 6 (six) hours as needed for up to 30 days for wheezing or shortness of breath. 11/11/18 03/11/19  Corum, Minerva Fester, MD  furosemide (LASIX) 20 MG tablet Take 1 tablet (20 mg total) by mouth daily for 14 days. 11/18/18 03/11/19  Minna Antis, MD    Not on File  Patient Active Problem List   Diagnosis Date Noted   Lower extremity edema 11/18/2018   Tobacco use disorder    Alcohol abuse    Chronic low back pain    Moderate persistent asthma without complication 04/24/2017    Past Medical History:  Diagnosis Date   Asthma     No past surgical history on file.  Social History   Socioeconomic History   Marital status: Married    Spouse name: Not on file   Number of children: Not on file   Years of education: Not on file   Highest education level: Not on file  Occupational History   Not on file  Social Needs   Financial resource strain: Not on file   Food insecurity    Worry: Not on file    Inability: Not on file   Transportation needs   Medical: Not on file    Non-medical: Not on file  Tobacco Use   Smoking status: Current Every Day Smoker    Packs/day: 1.00    Years: 8.00    Pack years: 8.00   Smokeless tobacco: Never Used  Substance and Sexual Activity   Alcohol use: Yes    Comment: 1-2 beer/daily   Drug use: No   Sexual activity: Not on file  Lifestyle   Physical activity    Days per week: Not on file    Minutes per session: Not on file   Stress: Not on file  Relationships   Social connections    Talks on phone: Not on file    Gets together: Not on file    Attends religious service: Not on file    Active member of club or organization: Not on file    Attends meetings of clubs or organizations: Not on file    Relationship status: Not on file   Intimate partner violence    Fear of current or ex partner: Not on file    Emotionally abused: Not on file    Physically abused: Not on file    Forced sexual activity: Not on file  Other Topics Concern   Not on file  Social History Narrative   Not on file  No family history on file.   Review of Systems  Constitutional: Negative.  Negative for chills and fever.  HENT: Negative.  Negative for congestion and sore throat.   Eyes: Negative.   Respiratory: Positive for wheezing. Negative for cough and shortness of breath.   Cardiovascular: Positive for leg swelling. Negative for chest pain and claudication.  Gastrointestinal: Negative.  Negative for abdominal pain, diarrhea, nausea and vomiting.  Genitourinary: Negative.   Musculoskeletal: Negative.   Skin: Positive for rash (Lower extremities).  Neurological: Negative for dizziness and headaches.  Endo/Heme/Allergies: Negative.   All other systems reviewed and are negative.  Vitals:   04/20/19 1622  BP: 128/79  Pulse: 93  Resp: 16  Temp: 98.4 F (36.9 C)  SpO2: 96%     Physical Exam Vitals signs reviewed.  Constitutional:      Appearance: Normal appearance.  HENT:     Head:  Normocephalic.  Eyes:     Extraocular Movements: Extraocular movements intact.     Conjunctiva/sclera: Conjunctivae normal.     Pupils: Pupils are equal, round, and reactive to light.  Neck:     Musculoskeletal: Normal range of motion and neck supple.  Cardiovascular:     Rate and Rhythm: Normal rate and regular rhythm.     Pulses: Normal pulses.     Heart sounds: Normal heart sounds.  Musculoskeletal:     Comments: Lower extremities: Mild edema.  No tenderness.  Full range of motion.  Neurovascularly intact with very strong distal pulses excellent capillary refill.  No other significant findings.  Changes compatible with chronic venous insufficiency.  No skin ulcers, erythema, or ecchymosis.  Skin:    General: Skin is warm and dry.     Comments: Stasis dermatitis skin changes to lower extremities  Neurological:     General: No focal deficit present.     Mental Status: He is alert and oriented to person, place, and time.     Sensory: No sensory deficit.     Motor: No weakness.  Psychiatric:        Mood and Affect: Mood normal.        Behavior: Behavior normal.      ASSESSMENT & PLAN: Renzo was seen today for leg swelling and leg pain.  Diagnoses and all orders for this visit:  Chronic venous insufficiency -     Ambulatory referral to Vascular Surgery  Venous stasis dermatitis of both lower extremities  Moderate persistent asthma without complication -     beclomethasone (QVAR REDIHALER) 40 MCG/ACT inhaler; Inhale 1 puff into the lungs 2 (two) times daily.    Patient Instructions       If you have lab work done today you will be contacted with your lab results within the next 2 weeks.  If you have not heard from [place] then please contact us. The fastest way to get your results is to register for My Chart.   IF you received an x-ray today, you will receive an invoice from [facility]. Please contact [facility] at [phone] with questions or  concerns regarding your invoice.   IF you received labwork today, you will receive an invoice from [place]. Please contact [facility] at [phone] with questions or concerns regarding your invoice.   Our billing staff will not be able to assist you with questions regarding bills from these companies.  You will be contacted with the lab results as soon as they are available. The fastest way to get your results is to  activate your My Chart account. Instructions are located on the last page of this paperwork. If you have not heard from us regarding the results in 2 weeks, please contact this office.     Chronic Venous Insufficiency Chronic venous insufficiency is a condition where the leg veins cannot effectively pump blood from the legs to the heart. This happens when the vein walls are either stretched, weakened, or damaged, or when the valves inside the vein are damaged. With the right treatment, you should be able to continue with an active life. This condition is also called venous stasis. What are the causes? Common causes of this condition include:  High blood pressure inside the veins (venous hypertension).  Sitting or standing too long, causing increased blood pressure in the leg veins.  A blood clot that blocks blood flow in a vein (deep vein thrombosis, DVT).  Inflammation of a vein (phlebitis) that causes a blood clot to form.  Tumors in the pelvis that cause blood to back up. What increases the risk? The following factors may make you more likely to develop this condition:  Having a family history of this condition.  Obesity.  Pregnancy.  Living without enough regular physical activity or exercise (sedentary lifestyle).  Smoking.  Having a job that requires long periods of standing or sitting in one place.  Being a certain age. Women in their 2740s and 5550s and men in their 7270s are more likely to develop this condition. What are the signs or symptoms? Symptoms of  this condition include:  Veins that are enlarged, bulging, or twisted (varicose veins).  Skin breakdown or ulcers.  Reddened skin or dark discoloration of skin on the leg between the knee and ankle.  Brown, smooth, tight, and painful skin just above the ankle, usually on the inside of the leg (lipodermatosclerosis).  Swelling of the legs. How is this diagnosed? This condition may be diagnosed based on:  Your medical history.  A physical exam.  Tests, such as: ? A procedure that creates an image of a blood vessel and nearby organs and provides information about blood flow through the blood vessel (duplex ultrasound). ? A procedure that tests blood flow (plethysmography). ? A procedure that looks at the veins using X-ray and dye (venogram). How is this treated? The goals of treatment are to help you return to an active life and to minimize pain or disability. Treatment depends on the severity of your condition, and it may include:  Wearing compression stockings. These can help relieve symptoms and help prevent your condition from getting worse. However, they do not cure the condition.  Sclerotherapy. This procedure involves an injection of a solution that shrinks damaged veins.  Surgery. This may involve: ? Removing a diseased vein (vein stripping). ? Cutting off blood flow through the vein (laser ablation surgery). ? Repairing or reconstructing a valve within the affected vein. Follow these instructions at home:      Wear compression stockings as told by your health care provider. These stockings help to prevent blood clots and reduce swelling in your legs.  Take over-the-counter and prescription medicines only as told by your health care provider.  Stay active by exercising, walking, or doing different activities. Ask your health care provider what activities are safe for you and how much exercise you need.  Drink enough fluid to keep your urine pale yellow.  Do not use  any products that contain nicotine or tobacco, such as cigarettes, e-cigarettes, and chewing tobacco. If you  need help quitting, ask your health care provider.  Keep all follow-up visits as told by your health care provider. This is important. Contact a health care provider if you:  Have redness, swelling, or more pain in the affected area.  See a red streak or line that goes up or down from the affected area.  Have skin breakdown or skin loss in the affected area, even if the breakdown is small.  Get an injury in the affected area. Get help right away if:  You get an injury and an open wound in the affected area.  You have: ? Severe pain that does not get better with medicine. ? Sudden numbness or weakness in the foot or ankle below the affected area. ? Trouble moving your foot or ankle. ? A fever. ? Worse or persistent symptoms. ? Chest pain. ? Shortness of breath. Summary  Chronic venous insufficiency is a condition where the leg veins cannot effectively pump blood from the legs to the heart.  Chronic venous insufficiency occurs when the vein walls become stretched, weakened, or damaged, or when valves within the vein are damaged.  Treatment depends on how severe your condition is. It often involves wearing compression stockings and may involve having a procedure.  Make sure you stay active by exercising, walking, or doing different activities. Ask your health care provider what activities are safe for you and how much exercise you need. This information is not intended to replace advice given to you by your health care provider. Make sure you discuss any questions you have with your health care provider. Document Released: 11/12/2006 Document Revised: 04/01/2018 Document Reviewed: 04/01/2018 Elsevier Patient Education  2020 [employer]      Edwina Barth, MD [facility]

## 2019-04-20 NOTE — Patient Instructions (Addendum)
If you have lab work done today you will be contacted with your lab results within the next 2 weeks.  If you have not heard from [place] then please contact us. The fastest way to get your results is to register for My Chart.   IF you received an x-ray today, you will receive an invoice from [facility]. Please contact [facility] at [phone] with questions or concerns regarding your invoice.   IF you received labwork today, you will receive an invoice from [place]. Please contact [facility] at [phone] with questions or concerns regarding your invoice.   Our billing staff will not be able to assist you with questions regarding bills from these companies.  You will be contacted with the lab results as soon as they are available. The fastest way to get your results is to activate your My Chart account. Instructions are located on the last page of this paperwork. If you have not heard from [place] regarding the results in 2 weeks, please contact this office.     Chronic Venous Insufficiency Chronic venous insufficiency is a condition where the leg veins cannot effectively pump blood from the legs to the heart. This happens when the vein walls are either stretched, weakened, or damaged, or when the valves inside the vein are damaged. With the right treatment, you should be able to continue with an active life. This condition is also called venous stasis. What are the causes? Common causes of this condition include:  High blood pressure inside the veins (venous hypertension).  Sitting or standing too long, causing increased blood pressure in the leg veins.  A blood clot that blocks blood flow in a vein (deep vein thrombosis, DVT).  Inflammation of a vein (phlebitis) that causes a blood clot to form.  Tumors in the pelvis that cause blood to back up. What increases the risk? The following factors may make you more likely to develop this condition:  Having a family  history of this condition.  Obesity.  Pregnancy.  Living without enough regular physical activity or exercise (sedentary lifestyle).  Smoking.  Having a job that requires long periods of standing or sitting in one place.  Being a certain age. Women in their 18s and 26s and men in their 76s are more likely to develop this condition. What are the signs or symptoms? Symptoms of this condition include:  Veins that are enlarged, bulging, or twisted (varicose veins).  Skin breakdown or ulcers.  Reddened skin or dark discoloration of skin on the leg between the knee and ankle.  Brown, smooth, tight, and painful skin just above the ankle, usually on the inside of the leg (lipodermatosclerosis).  Swelling of the legs. How is this diagnosed? This condition may be diagnosed based on:  Your medical history.  A physical exam.  Tests, such as: ? A procedure that creates an image of a blood vessel and nearby organs and provides information about blood flow through the blood vessel (duplex ultrasound). ? A procedure that tests blood flow (plethysmography). ? A procedure that looks at the veins using X-ray and dye (venogram). How is this treated? The goals of treatment are to help you return to an active life and to minimize pain or disability. Treatment depends on the severity of your condition, and it may include:  Wearing compression stockings. These can help relieve symptoms and help prevent your condition from getting worse. However, they do not cure the condition.  Sclerotherapy. This procedure involves an  injection of a solution that shrinks damaged veins.  Surgery. This may involve: ? Removing a diseased vein (vein stripping). ? Cutting off blood flow through the vein (laser ablation surgery). ? Repairing or reconstructing a valve within the affected vein. Follow these instructions at home:      Wear compression stockings as told by your health care provider. These  stockings help to prevent blood clots and reduce swelling in your legs.  Take over-the-counter and prescription medicines only as told by your health care provider.  Stay active by exercising, walking, or doing different activities. Ask your health care provider what activities are safe for you and how much exercise you need.  Drink enough fluid to keep your urine pale yellow.  Do not use any products that contain nicotine or tobacco, such as cigarettes, e-cigarettes, and chewing tobacco. If you need help quitting, ask your health care provider.  Keep all follow-up visits as told by your health care provider. This is important. Contact a health care provider if you:  Have redness, swelling, or more pain in the affected area.  See a red streak or line that goes up or down from the affected area.  Have skin breakdown or skin loss in the affected area, even if the breakdown is small.  Get an injury in the affected area. Get help right away if:  You get an injury and an open wound in the affected area.  You have: ? Severe pain that does not get better with medicine. ? Sudden numbness or weakness in the foot or ankle below the affected area. ? Trouble moving your foot or ankle. ? A fever. ? Worse or persistent symptoms. ? Chest pain. ? Shortness of breath. Summary  Chronic venous insufficiency is a condition where the leg veins cannot effectively pump blood from the legs to the heart.  Chronic venous insufficiency occurs when the vein walls become stretched, weakened, or damaged, or when valves within the vein are damaged.  Treatment depends on how severe your condition is. It often involves wearing compression stockings and may involve having a procedure.  Make sure you stay active by exercising, walking, or doing different activities. Ask your health care provider what activities are safe for you and how much exercise you need. This information is not intended to replace advice  given to you by your health care provider. Make sure you discuss any questions you have with your health care provider. Document Released: 11/12/2006 Document Revised: 04/01/2018 Document Reviewed: 04/01/2018 Elsevier Patient Education  2020 [employer].

## 2019-04-24 ENCOUNTER — Emergency Department
Admission: EM | Admit: 2019-04-24 | Discharge: 2019-04-24 | Disposition: A | Discharge status: Home / Self Care | Payer: 59 | Attending: Emergency Medicine | Admitting: Emergency Medicine

## 2019-04-24 ENCOUNTER — Emergency Department: Payer: 59

## 2019-04-24 ENCOUNTER — Other Ambulatory Visit: Payer: Self-pay

## 2019-04-24 DIAGNOSIS — F17210 Nicotine dependence, cigarettes, uncomplicated: Secondary | ICD-10-CM | POA: Diagnosis not present

## 2019-04-24 DIAGNOSIS — Z79899 Other long term (current) drug therapy: Secondary | ICD-10-CM | POA: Diagnosis not present

## 2019-04-24 DIAGNOSIS — K5790 Diverticulosis of intestine, part unspecified, without perforation or abscess without bleeding: Secondary | ICD-10-CM | POA: Diagnosis not present

## 2019-04-24 DIAGNOSIS — R109 Unspecified abdominal pain: Secondary | ICD-10-CM | POA: Diagnosis present

## 2019-04-24 DIAGNOSIS — J45909 Unspecified asthma, uncomplicated: Secondary | ICD-10-CM | POA: Diagnosis not present

## 2019-04-24 DIAGNOSIS — R1011 Right upper quadrant pain: Secondary | ICD-10-CM | POA: Insufficient documentation

## 2019-04-24 LAB — CBC
HCT: 41 % (ref 39.0–52.0)
Hemoglobin: 14.1 g/dL (ref 13.0–17.0)
MCH: 29.1 pg (ref 26.0–34.0)
MCHC: 34.4 g/dL (ref 30.0–36.0)
MCV: 84.7 fL (ref 80.0–100.0)
Platelets: 287 10*3/uL (ref 150–400)
RBC: 4.84 MIL/uL (ref 4.22–5.81)
RDW: 12.1 % (ref 11.5–15.5)
WBC: 6.9 10*3/uL (ref 4.0–10.5)
nRBC: 0 % (ref 0.0–0.2)

## 2019-04-24 LAB — COMPREHENSIVE METABOLIC PANEL
ALT: 39 U/L (ref 0–44)
AST: 27 U/L (ref 15–41)
Albumin: 4.2 g/dL (ref 3.5–5.0)
Alkaline Phosphatase: 65 U/L (ref 38–126)
Anion gap: 9 (ref 5–15)
BUN: 15 mg/dL (ref 6–20)
CO2: 28 mmol/L (ref 22–32)
Calcium: 9.1 mg/dL (ref 8.9–10.3)
Chloride: 104 mmol/L (ref 98–111)
Creatinine, Ser: 0.99 mg/dL (ref 0.61–1.24)
GFR calc Af Amer: >60 mL/min (ref >60)
GFR calc non Af Amer: >60 mL/min (ref >60)
Glucose, Bld: 93 mg/dL (ref 70–99)
Potassium: 3.9 mmol/L (ref 3.5–5.1)
Sodium: 141 mmol/L (ref 135–145)
Total Bilirubin: 0.6 mg/dL (ref 0.3–1.2)
Total Protein: 7.5 g/dL (ref 6.5–8.1)

## 2019-04-24 LAB — URINALYSIS, COMPLETE (UACMP) WITH MICROSCOPIC
Bacteria, UA: NONE SEEN
Bilirubin Urine: NEGATIVE
Glucose, UA: NEGATIVE mg/dL
Hgb urine dipstick: NEGATIVE
Ketones, ur: NEGATIVE mg/dL
Leukocytes,Ua: NEGATIVE
Nitrite: NEGATIVE
Protein, ur: NEGATIVE mg/dL
Specific Gravity, Urine: 1.026 (ref 1.005–1.030)
Squamous Epithelial / HPF: NONE SEEN (ref 0–5)
pH: 6 (ref 5.0–8.0)

## 2019-04-24 LAB — LIPASE, BLOOD: Lipase: 23 U/L (ref 11–51)

## 2019-04-24 MED ORDER — IOHEXOL 9 MG/ML PO SOLN
1000.0000 mL | Freq: Once | ORAL | Status: DC | PRN
  Filled 2019-04-24: qty 1000

## 2019-04-24 MED ORDER — OMEPRAZOLE 10 MG PO CPDR: 10.0000 mg | DELAYED_RELEASE_CAPSULE | Freq: Every day | ORAL | 0 refills | Status: DC

## 2019-04-24 MED ORDER — SODIUM CHLORIDE 0.9% FLUSH: 3.0000 mL | Freq: Once | INTRAVENOUS | Status: DC

## 2019-04-24 MED ORDER — MELOXICAM 15 MG PO TABS: 15.0000 mg | ORAL_TABLET | Freq: Every day | ORAL | 0 refills | Status: DC

## 2019-04-24 MED ORDER — IOHEXOL 300 MG/ML  SOLN
100.0000 mL | Freq: Once | INTRAMUSCULAR | Status: AC | PRN
  Administered 2019-04-24: 100 mL via INTRAVENOUS
  Filled 2019-04-24: qty 100

## 2019-04-24 NOTE — ED Notes (Signed)
PA Cuthriel at bedside.

## 2019-04-24 NOTE — ED Provider Notes (Signed)
[facility] Emergency Department Provider Note  ____________________________________________  Time seen: Approximately 7:10 PM  I have reviewed the triage vital signs and the nursing notes.   HISTORY  Chief Complaint Abdominal Pain    HPI Marc Henderson is a 33 y.o. male who presents the emergency department complaining of right upper quadrant pain.  Patient has been having intermittent pain to the right upper quadrant x10 days.  Patient reports that it will be a mild ache for periods of time and then a sharp pain that "takes away my breath."  Patient reports that sometimes his pain radiates from the right lower quadrant into his right shoulder blade.  Patient denies any change in pain based off of activity or recent food intake.  No history of gallbladder issues.  Patient does have a history of alcohol abuse but has never had any problems with his liver to include ascites, cirrhosis.  Patient denies any fevers or chills, nausea or emesis, diarrhea or constipation.  No other abdominal pain.  No medications for his complaint prior to arrival.         Past Medical History:  Diagnosis Date  . Asthma     Patient Active Problem List   Diagnosis Date Noted  . Lower extremity edema 11/18/2018  . Tobacco use disorder   . Alcohol abuse   . Chronic low back pain   . Moderate persistent asthma without complication 04/24/2017    History reviewed. No pertinent surgical history.  Prior to Admission medications   Medication Sig Start Date End Date Taking? Authorizing Provider  albuterol (PROAIR HFA) 108 (90 Base) MCG/ACT inhaler Inhale 2 puffs into the lungs every 6 (six) hours as needed for wheezing or shortness of breath. 02/25/19   Georgina Quint, MD  albuterol (PROVENTIL) (2.5 MG/3ML) 0.083% nebulizer solution Take 3 mLs (2.5 mg total) by nebulization every 6 (six) hours as needed for up to 30 days for wheezing or shortness of breath. 11/11/18 03/11/19   Corum, Minerva Fester, MD  beclomethasone (QVAR REDIHALER) 40 MCG/ACT inhaler Inhale 1 puff into the lungs 2 (two) times daily. 04/20/19   Georgina Quint, MD  furosemide (LASIX) 20 MG tablet Take 1 tablet (20 mg total) by mouth daily for 14 days. 11/18/18 03/11/19  Minna Antis, MD  meloxicam (MOBIC) 15 MG tablet Take 1 tablet (15 mg total) by mouth daily. 04/24/19   Cuthriell, Delorise Royals, PA-C  omeprazole (PRILOSEC) 10 MG capsule Take 1 capsule (10 mg total) by mouth daily. 04/24/19   Cuthriell, Delorise Royals, PA-C    Allergies Patient has no allergy information on record.  No family history on file.  Social History Social History   Tobacco Use  . Smoking status: Current Every Day Smoker    Packs/day: 1.00    Years: 8.00    Pack years: 8.00  . Smokeless tobacco: Never Used  Substance Use Topics  . Alcohol use: Yes    Comment: 1-2 beer/daily  . Drug use: No     Review of Systems  Constitutional: No fever/chills Eyes: No visual changes. No discharge ENT: No upper respiratory complaints. Cardiovascular: no chest pain. Respiratory: no cough. No SOB. Gastrointestinal: Positive for right upper quadrant abdominal pain.  No nausea, no vomiting.  No diarrhea.  No constipation. Genitourinary: Negative for dysuria. No hematuria Musculoskeletal: Negative for musculoskeletal pain. Skin: Negative for rash, abrasions, lacerations, ecchymosis. Neurological: Negative for headaches, focal weakness or numbness. 10-point ROS otherwise negative.  ____________________________________________   PHYSICAL  EXAM:  VITAL SIGNS: ED Triage Vitals  Enc Vitals Group     BP 04/24/19 1606 (!) 132/95     Pulse Rate 04/24/19 1606 92     Resp 04/24/19 1606 18     Temp 04/24/19 1606 98.2 F (36.8 C)     Temp src --      SpO2 04/24/19 1606 97 %     Weight 04/24/19 1552 262 lb (118.8 kg)     Height 04/24/19 1552 5\' 11"  (1.803 m)     Head Circumference --      Peak Flow --      Pain Score 04/24/19  1551 4     Pain Loc --      Pain Edu? --      Excl. in [place]? --      Constitutional: Alert and oriented. Well appearing and in no acute distress. Eyes: Conjunctivae are normal. PERRL. EOMI. Head: Atraumatic. ENT:      Ears:       Nose: No congestion/rhinnorhea.      Mouth/Throat: Mucous membranes are moist.  Neck: No stridor.    Cardiovascular: Normal rate, regular rhythm. Normal S1 and S2.  Good peripheral circulation. Respiratory: Normal respiratory effort without tachypnea or retractions. Lungs CTAB. Good air entry to the bases with no decreased or absent breath sounds. Gastrointestinal: No visible abnormality to the abdominal wall.  No distention.  Bowel sounds 4 quadrants.  Soft to palpation all quadrants.  Patient is tender to palpation in the right upper quadrant and left lower quadrants.  Patient is guarding the left lower quadrant.  No other guarding noted.  Positive Murphy sign.  No palpable masses. No distention. No CVA tenderness. Musculoskeletal: Full range of motion to all extremities. No gross deformities appreciated. Neurologic:  Normal speech and language. No gross focal neurologic deficits are appreciated.  Skin:  Skin is warm, dry and intact. No rash noted. Psychiatric: Mood and affect are normal. Speech and behavior are normal. Patient exhibits appropriate insight and judgement.   ____________________________________________   LABS (all labs ordered are listed, but only abnormal results are displayed)  Labs Reviewed  URINALYSIS, COMPLETE (UACMP) WITH MICROSCOPIC - Abnormal; Notable for the following components:      Result Value   Color, Urine YELLOW (*)    APPearance CLEAR (*)    All other components within normal limits  LIPASE, BLOOD  COMPREHENSIVE METABOLIC PANEL  CBC   ____________________________________________  EKG   ____________________________________________  RADIOLOGY I personally viewed and evaluated these images as part of my medical  decision making, as well as reviewing the written report by the radiologist.  Ct Abdomen Pelvis W Contrast  Result Date: 04/24/2019 CLINICAL DATA:  Right upper quadrant pain, left lower quadrant pain EXAM: CT ABDOMEN AND PELVIS WITH CONTRAST TECHNIQUE: Multidetector CT imaging of the abdomen and pelvis was performed using the standard protocol following bolus administration of intravenous contrast. CONTRAST:  169mL OMNIPAQUE IOHEXOL 300 MG/ML SOLN, additional oral enteric contrast COMPARISON:  None. FINDINGS: Lower chest: There are subtle scattered ground-glass opacities in the bilateral lung bases, most conspicuous in the right lung base (series 4, image 24), with some bandlike scarring or atelectasis, for example in the lingula (series 4, image 10). Hepatobiliary: No solid liver abnormality is seen. No gallstones, gallbladder wall thickening, or biliary dilatation. Pancreas: Unremarkable. No pancreatic ductal dilatation or surrounding inflammatory changes. Spleen: Normal in size without significant abnormality. Adrenals/Urinary Tract: Adrenal glands are unremarkable. Kidneys are normal, without renal calculi,  solid lesion, or hydronephrosis. Bladder is unremarkable. Stomach/Bowel: Stomach is within normal limits. Appendix appears normal. No evidence of bowel wall thickening, distention, or inflammatory changes. Sigmoid diverticulosis. Vascular/Lymphatic: No significant vascular findings are present. No enlarged abdominal or pelvic lymph nodes. Reproductive: No mass or other significant abnormality. Other: No abdominal wall hernia or abnormality. No abdominopelvic ascites. Musculoskeletal: No acute or significant osseous findings. IMPRESSION: 1.  No acute CT findings of the abdomen or pelvis to pain. 2.  Sigmoid diverticulosis without evidence of acute diverticulitis. 3. There are subtle scattered ground-glass opacities in the bilateral lung bases, most conspicuous in the right lung base (series 4, image 24),  with some bandlike scarring or atelectasis, for example in the lingula (series 4, image 10). These are nonspecific and infectious or inflammatory. Electronically Signed   By: Lauralyn PrimesAlex  Bibbey M.D.   On: 04/24/2019 20:42    ____________________________________________    PROCEDURES  Procedure(s) performed:    Procedures    Medications  sodium chloride flush (NS) 0.9 % injection 3 mL (3 mLs Intravenous Not Given 04/24/19 2044)  iohexol (OMNIPAQUE) 9 MG/ML oral solution 1,000 mL (has no administration in time range)  iohexol (OMNIPAQUE) 300 MG/ML solution 100 mL (100 mLs Intravenous Contrast Given 04/24/19 2028)     ____________________________________________   INITIAL IMPRESSION / ASSESSMENT AND PLAN / ED COURSE  Pertinent labs & imaging results that were available during my care of the patient were reviewed by me and considered in my medical decision making (see chart for details).  Review of the [place] CSRS was performed in accordance of the [facility] prior to dispensing any controlled drugs.           Patient's diagnosis is consistent with diverticulosis, right upper quadrant abdominal pain.  Patient presented to emergency department complaining of right upper quadrant pain primarily.  Patient also had some left lower quadrant pain on exam.  CT reveals diverticulosis without any other acute finding on imaging.  Labs are reassuring.  Patient's right upper quadrant pain does appear to be associated with certain food intakes.  Patient also endorses some increase in his GERD.  As such, will place patient on anti-inflammatory as well as PPI.  Patient will be placed on meloxicam and omeprazole.  If symptoms persist, patient is to follow-up with GI.  I did discuss dietary changes temporarily to see if this would improve patient's symptoms.  No further work-up at this time..  Patient is given ED precautions to return to the ED for any worsening or new  symptoms.     ____________________________________________  FINAL CLINICAL IMPRESSION(S) / ED DIAGNOSES  Final diagnoses:  Diverticulosis  RUQ abdominal pain      NEW MEDICATIONS STARTED DURING THIS VISIT:  ED Discharge Orders         Ordered    meloxicam (MOBIC) 15 MG tablet  Daily     04/24/19 2128    omeprazole (PRILOSEC) 10 MG capsule  Daily     04/24/19 2128              This chart was dictated using voice recognition software/Dragon. Despite best efforts to proofread, errors can occur which can change the meaning. Any change was purely unintentional.    Racheal PatchesCuthriell, Jonathan D, PA-C 04/24/19 2129    Emily FilbertWilliams, Jonathan E, MD 04/24/19 2232

## 2019-04-24 NOTE — ED Triage Notes (Signed)
Pt to the er for ruq abd pain x 1 week with decreased appetite that pain moves through to the back.Pt still has a gallbladder.

## 2019-04-25 ENCOUNTER — Other Ambulatory Visit: Payer: Self-pay | Admitting: Emergency Medicine

## 2019-04-25 DIAGNOSIS — J4540 Moderate persistent asthma, uncomplicated: Secondary | ICD-10-CM

## 2019-04-25 NOTE — Telephone Encounter (Signed)
Requested medication (s) are due for refill today: yes  Requested medication (s) are on the active medication list: yes  Last refill:  04/12/2019  Future visit scheduled: no  Notes to clinic:  Review for review  Requested Prescriptions  Pending Prescriptions Disp Refills   PROAIR HFA 108 (90 Base) MCG/ACT inhaler [Pharmacy Med Name: PROAIR HFA 90 MCG INHALER]  8    Sig: INHALE 2 PUFFS INTO THE LUNGS EVERY 6HRS AS NEEDED FOR WHEEZING/SHORTNESS OF BREATH. OV NEEDED FOR REFILLS     Pulmonology:  Beta Agonists Failed - 04/25/2019 11:45 AM      Failed - One inhaler should last at least one month. If the patient is requesting refills earlier, contact the patient to check for uncontrolled symptoms.      Passed - Valid encounter within last 12 months    Recent Outpatient Visits          5 days ago Chronic venous insufficiency   Primary Care at [facility], [place], [place]   1 month ago Moderate persistent asthma without complication   Primary Care at [facility], [place], [place]   1 month ago Moderate persistent asthma without complication   Primary Care at [facility], Ines [place], MD   5 months ago Lower extremity edema   Primary Care at [facility], Rex Kras, MD   5 months ago Moderate persistent asthma with acute exacerbation   Primary Care at [facility], Rex Kras, MD

## 2019-04-28 ENCOUNTER — Telehealth: Payer: Self-pay | Admitting: Emergency Medicine

## 2019-04-28 DIAGNOSIS — J4540 Moderate persistent asthma, uncomplicated: Secondary | ICD-10-CM

## 2019-04-28 MED ORDER — ALBUTEROL SULFATE (2.5 MG/3ML) 0.083% IN NEBU: 2.5000 mg | INHALATION_SOLUTION | Freq: Four times a day (QID) | RESPIRATORY_TRACT | 12 refills | Status: DC | PRN

## 2019-04-28 MED ORDER — ALBUTEROL SULFATE HFA 108 (90 BASE) MCG/ACT IN AERS: 2.0000 | INHALATION_SPRAY | Freq: Four times a day (QID) | RESPIRATORY_TRACT | 5 refills | Status: DC | PRN

## 2019-04-28 NOTE — Telephone Encounter (Signed)
Pt would like a refill on his albuterol [facility] HFA) 108 (90 Base) MCG/ACT inhaler [[id number]]   Pharmacy:  [id number] - [place], [place]  DEA #:  [id number]  Please advise.

## 2019-05-08 ENCOUNTER — Other Ambulatory Visit: Payer: Self-pay

## 2019-05-08 DIAGNOSIS — I872 Venous insufficiency (chronic) (peripheral): Secondary | ICD-10-CM

## 2019-05-11 NOTE — Progress Notes (Deleted)
VASCULAR & VEIN SPECIALISTS           [place]  History and Physical   Marc Henderson is a 33 y.o. (January 08, 1986) male who presents with hx of bilateral leg swelling and some skin discoloration.  He was recently seen by his PCP and evaluated for his leg swelling.  He was referred for evaluation.  The pt *** hx of DVT.  The pt *** hx of varicose veins or ulcers.   There *** skin changes ***.  The pt *** have family hx of venous issues.  The pt has *** tried compression socks.   He has asthma and uses inhalers.    The pt is not on a statin for cholesterol management.  The pt is not on a daily aspirin.   Other AC:  none The pt is not on meds for hypertension.   The pt is not diabetic.   Tobacco hx:  current  Past Medical History:  Diagnosis Date  . Asthma     No past surgical history on file.  Social History   Socioeconomic History  . Marital status: Married    Spouse name: Not on file  . Number of children: Not on file  . Years of education: Not on file  . Highest education level: Not on file  Occupational History  . Not on file  Social Needs  . Financial resource strain: Not on file  . Food insecurity    Worry: Not on file    Inability: Not on file  . Transportation needs    Medical: Not on file    Non-medical: Not on file  Tobacco Use  . Smoking status: Current Every Day Smoker    Packs/day: 1.00    Years: 8.00    Pack years: 8.00  . Smokeless tobacco: Never Used  Substance and Sexual Activity  . Alcohol use: Yes    Comment: 1-2 beer/daily  . Drug use: No  . Sexual activity: Not on file  Lifestyle  . Physical activity    Days per week: Not on file    Minutes per session: Not on file  . Stress: Not on file  Relationships  . Social Herbalist on phone: Not on file    Gets together: Not on file    Attends religious service: Not on file    Active member of club or organization: Not on file    Attends meetings of clubs or  organizations: Not on file    Relationship status: Not on file  . Intimate partner violence    Fear of current or ex partner: Not on file    Emotionally abused: Not on file    Physically abused: Not on file    Forced sexual activity: Not on file  Other Topics Concern  . Not on file  Social History Narrative  . Not on file    ***No family history on file.  Current Outpatient Medications  Medication Sig Dispense Refill  . albuterol (PROAIR HFA) 108 (90 Base) MCG/ACT inhaler Inhale 2 puffs into the lungs every 6 (six) hours as needed for wheezing or shortness of breath. 18 g 5  . albuterol (PROVENTIL) (2.5 MG/3ML) 0.083% nebulizer solution Take 3 mLs (2.5 mg total) by nebulization every 6 (six) hours as needed for wheezing or shortness of breath. 75 mL 12  . beclomethasone (QVAR REDIHALER) 40 MCG/ACT inhaler Inhale 1 puff into the lungs 2 (two)  times daily. 10.6 g 5  . furosemide (LASIX) 20 MG tablet Take 1 tablet (20 mg total) by mouth daily for 14 days. 14 tablet 0  . meloxicam (MOBIC) 15 MG tablet Take 1 tablet (15 mg total) by mouth daily. 30 tablet 0  . omeprazole (PRILOSEC) 10 MG capsule Take 1 capsule (10 mg total) by mouth daily. 30 capsule 0   No current facility-administered medications for this visit.     Not on File  REVIEW OF SYSTEMS:  *** [X]  denotes positive finding, [ ]  denotes negative finding Cardiac  Comments:  Chest pain or chest pressure:    Shortness of breath upon exertion:    Short of breath when lying flat:    Irregular heart rhythm:        Vascular    Pain in calf, thigh, or hip brought on by ambulation:    Pain in feet at night that wakes you up from your sleep:     Blood clot in your veins:    Leg swelling:         Pulmonary    Oxygen at home:    Productive cough:     Wheezing:         Neurologic    Sudden weakness in arms or legs:     Sudden numbness in arms or legs:     Sudden onset of difficulty speaking or slurred speech:    Temporary  loss of vision in one eye:     Problems with dizziness:         Gastrointestinal    Blood in stool:     Vomited blood:         Genitourinary    Burning when urinating:     Blood in urine:        Psychiatric    Major depression:         Hematologic    Bleeding problems:    Problems with blood clotting too easily:        Skin    Rashes or ulcers:        Constitutional    Fever or chills:      PHYSICAL EXAMINATION:  ***  General:  WDWN in NAD; vital signs documented above Gait: Not observed HENT: WNL, normocephalic Pulmonary: normal non-labored breathing , without Rales, rhonchi,  wheezing Cardiac: {Desc; regular/irreg:14544} HR; {With/Without:20273} carotid bruit*** Abdomen: soft, NT, no masses Skin: {With/Without:20273} rashes Vascular Exam/Pulses:  Right Left  Radial {Exam; arterial pulse strength 0-4:30167} {Exam; arterial pulse strength 0-4:30167}  Ulnar {Exam; arterial pulse strength 0-4:30167} {Exam; arterial pulse strength 0-4:30167}  Femoral {Exam; arterial pulse strength 0-4:30167} {Exam; arterial pulse strength 0-4:30167}  Popliteal {Exam; arterial pulse strength 0-4:30167} {Exam; arterial pulse strength 0-4:30167}  DP {Exam; arterial pulse strength 0-4:30167} {Exam; arterial pulse strength 0-4:30167}  PT {Exam; arterial pulse strength 0-4:30167} {Exam; arterial pulse strength 0-4:30167}   Extremities: {With/Without:20273} ischemic changes, {With/Without:20273} Gangrene , {With/Without:20273} cellulitis; {With/Without:20273} open wounds;  Musculoskeletal: no muscle wasting or atrophy  Neurologic: A&O X 3;  No focal weakness or paresthesias are detected Psychiatric:  The pt has {Desc; normal/abnormal:11317::"Normal"} affect.   Non-Invasive Vascular Imaging:   Venous duplex on 05/12/2019: ***  Venous u/s 11/17/2018: No evidence of DVT BLE   Marc Henderson is a 33 y.o. male who presents with: BLE leg swelling  ***  Marc Henderson, [facility] 05/11/2019 1:05 PM  Clinic MD:  Early

## 2019-05-12 ENCOUNTER — Encounter (HOSPITAL_COMMUNITY): Payer: 59

## 2019-06-29 ENCOUNTER — Other Ambulatory Visit: Payer: Self-pay | Admitting: Emergency Medicine

## 2019-06-29 DIAGNOSIS — J4540 Moderate persistent asthma, uncomplicated: Secondary | ICD-10-CM

## 2019-06-29 NOTE — Telephone Encounter (Signed)
Medication: albuterol (PROAIR HFA) 108 (90 Base) MCG/ACT inhaler [[id number]]   Has the patient contacted their pharmacy? Yes  (Agent: If no, request that the patient contact the pharmacy for the refill.) (Agent: If yes, when and what did the pharmacy advise?)  Preferred Pharmacy (with phone number or street name): [id number] - [place], [place] [phone] (Phone) [phone] (Fax)    Agent: Please be advised that RX refills may take up to 3 business days. We ask that you follow-up with your pharmacy.

## 2019-06-30 NOTE — Telephone Encounter (Signed)
Requested medication (s) are due for refill today: yes  Requested medication (s) are on the active medication list: yes  Last refill:  04/28/2019  Future visit scheduled: no  Notes to clinic:  Review for refill One inhaler should last one month   Requested Prescriptions  Pending Prescriptions Disp Refills   albuterol (PROAIR HFA) 108 (90 Base) MCG/ACT inhaler 18 g 5    Sig: Inhale 2 puffs into the lungs every 6 (six) hours as needed for wheezing or shortness of breath.     Pulmonology:  Beta Agonists Failed - 06/29/2019  3:44 PM      Failed - One inhaler should last at least one month. If the patient is requesting refills earlier, contact the patient to check for uncontrolled symptoms.      Passed - Valid encounter within last 12 months    Recent Outpatient Visits          2 months ago Chronic venous insufficiency   Primary Care at [facility], [place], [place]   3 months ago Moderate persistent asthma without complication   Primary Care at [facility], [place], [place]   4 months ago Moderate persistent asthma without complication   Primary Care at [facility], Ines Bloomer, MD   7 months ago Lower extremity edema   Primary Care at [facility], Rex Kras, MD   7 months ago Moderate persistent asthma with acute exacerbation   Primary Care at [facility], Rex Kras, MD

## 2019-07-02 ENCOUNTER — Other Ambulatory Visit: Payer: Self-pay

## 2019-07-02 DIAGNOSIS — I872 Venous insufficiency (chronic) (peripheral): Secondary | ICD-10-CM

## 2019-07-02 NOTE — Telephone Encounter (Signed)
Albuterol inhaler 18g #1 with 5 refills filled on 04/2019 so refill is not approved.

## 2019-07-03 ENCOUNTER — Encounter (HOSPITAL_COMMUNITY): Payer: 59

## 2019-07-08 ENCOUNTER — Encounter: Payer: Self-pay | Admitting: Family

## 2019-07-09 ENCOUNTER — Other Ambulatory Visit: Payer: Self-pay | Admitting: Emergency Medicine

## 2019-07-09 ENCOUNTER — Other Ambulatory Visit: Payer: Self-pay

## 2019-07-09 ENCOUNTER — Telehealth: Payer: Self-pay

## 2019-07-09 DIAGNOSIS — J4540 Moderate persistent asthma, uncomplicated: Secondary | ICD-10-CM

## 2019-07-09 MED ORDER — ALBUTEROL SULFATE HFA 108 (90 BASE) MCG/ACT IN AERS: 2.0000 | INHALATION_SPRAY | Freq: Four times a day (QID) | RESPIRATORY_TRACT | 1 refills | Status: DC | PRN

## 2019-07-09 NOTE — Telephone Encounter (Signed)
Pt requesting refill for Proair.  Call to pharmacy.  States Rx was filled 10/29, 11/10, 11/24 but rx now expired in their system.  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  Reviewed chart.  Pt states he never saw pulmonary for asthma since referral in 2019.  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  Advised that we can refill again but concerned d/t frequency of medical tx and use of inhaler.  Entering referral to pulmonary and pt will need to keep appt with them for eval and tx of asthma moving forward. Pt states it will just cost more money.  Advised that frequency of tx and inhalers needs further eval beyond primary care.

## 2019-08-26 ENCOUNTER — Encounter: Payer: Self-pay | Admitting: Surgery

## 2019-08-27 ENCOUNTER — Other Ambulatory Visit: Payer: Self-pay

## 2019-08-27 ENCOUNTER — Encounter: Payer: Self-pay | Admitting: Emergency Medicine

## 2019-08-27 ENCOUNTER — Telehealth (INDEPENDENT_AMBULATORY_CARE_PROVIDER_SITE_OTHER): Payer: 59 | Admitting: Emergency Medicine

## 2019-08-27 VITALS — Ht 71.0 in | Wt 265.0 lb

## 2019-08-27 DIAGNOSIS — J4540 Moderate persistent asthma, uncomplicated: Secondary | ICD-10-CM | POA: Diagnosis not present

## 2019-08-27 DIAGNOSIS — R112 Nausea with vomiting, unspecified: Secondary | ICD-10-CM

## 2019-08-27 MED ORDER — ALBUTEROL SULFATE (2.5 MG/3ML) 0.083% IN NEBU: 2.5000 mg | INHALATION_SOLUTION | Freq: Four times a day (QID) | RESPIRATORY_TRACT | 12 refills | Status: DC | PRN

## 2019-08-27 MED ORDER — ALBUTEROL SULFATE HFA 108 (90 BASE) MCG/ACT IN AERS: 2.0000 | INHALATION_SPRAY | Freq: Four times a day (QID) | RESPIRATORY_TRACT | 11 refills | Status: DC | PRN

## 2019-08-27 NOTE — Progress Notes (Signed)
Telemedicine Encounter- SOAP NOTE Established Patient My chart video encounter attempted without success. This telephone encounter was conducted with the patient's (or proxy's) verbal consent via audio telecommunications: yes/no: Yes Patient was instructed to have this encounter in a suitably private space; and to only have persons present to whom they give permission to participate. In addition, patient identity was confirmed by use of name plus two identifiers (DOB and address).  I discussed the limitations, risks, security and privacy concerns of performing an evaluation and management service by telephone and the availability of in person appointments. I also discussed with the patient that there may be a patient responsible charge related to this service. The patient expressed understanding and agreed to proceed.  I spent a total of TIME; 0 MIN TO 60 MIN: 15 minutes talking with the patient or their proxy.  Chief Complaint  Patient presents with  . Nausea    Nasuea and vomiting started today.     Subjective   Marc Henderson is a 34 y.o. male established patient. Telephone visit today complaining of nausea and vomiting that started this morning after arriving to work.  Had to go home due to symptoms.  Starting to feel better now.  No longer vomiting and no longer nauseous.  Denies abdominal pain.  Denies diarrhea.  No other associated symptoms. Also has a history of asthma, requesting refill of albuterol inhaler and albuterol solution.  HPI   Patient Active Problem List   Diagnosis Date Noted  . Tobacco use disorder   . Alcohol abuse   . Chronic low back pain   . Moderate persistent asthma without complication 04/24/2017    Past Medical History:  Diagnosis Date  . Asthma     Current Outpatient Medications  Medication Sig Dispense Refill  . albuterol (PROAIR HFA) 108 (90 Base) MCG/ACT inhaler Inhale 2 puffs into the lungs every 6 (six) hours as needed for wheezing or  shortness of breath. 18 g 1  . beclomethasone (QVAR REDIHALER) 40 MCG/ACT inhaler Inhale 1 puff into the lungs 2 (two) times daily. 10.6 g 5  . omeprazole (PRILOSEC) 10 MG capsule Take 1 capsule (10 mg total) by mouth daily. 30 capsule 0  . furosemide (LASIX) 20 MG tablet Take 1 tablet (20 mg total) by mouth daily for 14 days. 14 tablet 0   No current facility-administered medications for this visit.    Not on File  Social History   Socioeconomic History  . Marital status: Married    Spouse name: Not on file  . Number of children: Not on file  . Years of education: Not on file  . Highest education level: Not on file  Occupational History  . Not on file  Tobacco Use  . Smoking status: Current Every Day Smoker    Packs/day: 1.00    Years: 8.00    Pack years: 8.00  . Smokeless tobacco: Never Used  Substance and Sexual Activity  . Alcohol use: Yes    Comment: 1-2 beer/daily  . Drug use: No  . Sexual activity: Not on file  Other Topics Concern  . Not on file  Social History Narrative  . Not on file   Social Determinants of Health   Financial Resource Strain:   . Difficulty of Paying Living Expenses: Not on file  Food Insecurity:   . Worried About Programme researcher, broadcasting/film/video in the Last Year: Not on file  . Ran Out of Food in the Last Year: Not  on file  Transportation Needs:   . Lack of Transportation (Medical): Not on file  . Lack of Transportation (Non-Medical): Not on file  Physical Activity:   . Days of Exercise per Week: Not on file  . Minutes of Exercise per Session: Not on file  Stress:   . Feeling of Stress : Not on file  Social Connections:   . Frequency of Communication with Friends and Family: Not on file  . Frequency of Social Gatherings with Friends and Family: Not on file  . Attends Religious Services: Not on file  . Active Member of Clubs or Organizations: Not on file  . Attends Archivist Meetings: Not on file  . Marital Status: Not on file   Intimate Partner Violence:   . Fear of Current or Ex-Partner: Not on file  . Emotionally Abused: Not on file  . Physically Abused: Not on file  . Sexually Abused: Not on file    ROS  Objective  Alert and oriented x3 in no apparent respiratory distress. Vitals as reported by the patient: Today's Vitals   08/27/19 1349  Weight: 265 lb (120.2 kg)  Height: 5\' 11"  (1.803 m)    There are no diagnoses linked to this encounter. Marc Henderson was seen today for nausea.  Diagnoses and all orders for this visit:  Non-intractable vomiting with nausea, unspecified vomiting type Comments: Suspected food poisoning  Moderate persistent asthma without complication -     albuterol (PROAIR HFA) 108 (90 Base) MCG/ACT inhaler; Inhale 2 puffs into the lungs every 6 (six) hours as needed for wheezing or shortness of breath. -     albuterol (PROVENTIL) (2.5 MG/3ML) 0.083% nebulizer solution; Take 3 mLs (2.5 mg total) by nebulization every 6 (six) hours as needed for wheezing or shortness of breath.  Moderate persistent asthma, unspecified whether complicated -     albuterol (PROVENTIL) (2.5 MG/3ML) 0.083% nebulizer solution; Take 3 mLs (2.5 mg total) by nebulization every 6 (six) hours as needed for wheezing or shortness of breath.  Clinically stable.  No Covid symptoms.  No red flag signs or symptoms.  Clinically improving.  Continue vigorous hydration.  Brat diet discussed with patient.  Advised to contact the office if no better or worse in the next several days.  Able to go to work tomorrow morning if you continue to improve.   I discussed the assessment and treatment plan with the patient. The patient was provided an opportunity to ask questions and all were answered. The patient agreed with the plan and demonstrated an understanding of the instructions.   The patient was advised to call back or seek an in-person evaluation if the symptoms worsen or if the condition fails to improve as anticipated.   I provided 15 minutes of non-face-to-face time during this encounter.  Horald Pollen, MD  Primary Care at [facility]

## 2019-08-27 NOTE — Patient Instructions (Signed)
° ° ° °  If you have lab work done today you will be contacted with your lab results within the next 2 weeks.  If you have not heard from us then please contact us. The fastest way to get your results is to register for My Chart. ° ° °IF you received an x-ray today, you will receive an invoice from [facility]. Please contact [facility] at [phone] with questions or concerns regarding your invoice.  ° °IF you received labwork today, you will receive an invoice from [facility]. Please contact [facility] at [phone] with questions or concerns regarding your invoice.  ° °Our billing staff will not be able to assist you with questions regarding bills from these companies. ° °You will be contacted with the lab results as soon as they are available. The fastest way to get your results is to activate your My Chart account. Instructions are located on the last page of this paperwork. If you have not heard from us regarding the results in 2 weeks, please contact this office. °  ° ° ° °

## 2019-12-03 ENCOUNTER — Encounter: Payer: 59 | Admitting: Family Medicine

## 2019-12-03 ENCOUNTER — Encounter: Payer: Self-pay | Admitting: Family Medicine

## 2019-12-03 ENCOUNTER — Other Ambulatory Visit: Payer: Self-pay

## 2019-12-03 ENCOUNTER — Emergency Department: Payer: 59

## 2019-12-03 DIAGNOSIS — Z20822 Contact with and (suspected) exposure to covid-19: Secondary | ICD-10-CM | POA: Diagnosis present

## 2019-12-03 DIAGNOSIS — D721 Eosinophilia, unspecified: Secondary | ICD-10-CM | POA: Diagnosis present

## 2019-12-03 DIAGNOSIS — Z7951 Long term (current) use of inhaled steroids: Secondary | ICD-10-CM

## 2019-12-03 DIAGNOSIS — Z7952 Long term (current) use of systemic steroids: Secondary | ICD-10-CM

## 2019-12-03 DIAGNOSIS — J9601 Acute respiratory failure with hypoxia: Secondary | ICD-10-CM | POA: Diagnosis not present

## 2019-12-03 DIAGNOSIS — J4541 Moderate persistent asthma with (acute) exacerbation: Secondary | ICD-10-CM | POA: Diagnosis present

## 2019-12-03 DIAGNOSIS — K5903 Drug induced constipation: Secondary | ICD-10-CM | POA: Diagnosis present

## 2019-12-03 DIAGNOSIS — R0602 Shortness of breath: Secondary | ICD-10-CM | POA: Diagnosis not present

## 2019-12-03 DIAGNOSIS — F17210 Nicotine dependence, cigarettes, uncomplicated: Secondary | ICD-10-CM | POA: Diagnosis present

## 2019-12-03 DIAGNOSIS — J029 Acute pharyngitis, unspecified: Secondary | ICD-10-CM | POA: Diagnosis present

## 2019-12-03 DIAGNOSIS — F1120 Opioid dependence, uncomplicated: Secondary | ICD-10-CM | POA: Diagnosis present

## 2019-12-03 DIAGNOSIS — T402X5A Adverse effect of other opioids, initial encounter: Secondary | ICD-10-CM | POA: Diagnosis present

## 2019-12-03 DIAGNOSIS — J208 Acute bronchitis due to other specified organisms: Secondary | ICD-10-CM | POA: Diagnosis present

## 2019-12-03 DIAGNOSIS — Z79899 Other long term (current) drug therapy: Secondary | ICD-10-CM

## 2019-12-03 LAB — BASIC METABOLIC PANEL
Anion gap: 10 (ref 5–15)
BUN: 10 mg/dL (ref 6–20)
CO2: 24 mmol/L (ref 22–32)
Calcium: 9.2 mg/dL (ref 8.9–10.3)
Chloride: 103 mmol/L (ref 98–111)
Creatinine, Ser: 0.9 mg/dL (ref 0.61–1.24)
GFR calc Af Amer: >60 mL/min (ref >60)
GFR calc non Af Amer: >60 mL/min (ref >60)
Glucose, Bld: 128 mg/dL — ABNORMAL HIGH (ref 70–99)
Potassium: 4.1 mmol/L (ref 3.5–5.1)
Sodium: 137 mmol/L (ref 135–145)

## 2019-12-03 LAB — CBC
HCT: 43.9 % (ref 39.0–52.0)
Hemoglobin: 15.7 g/dL (ref 13.0–17.0)
MCH: 29.1 pg (ref 26.0–34.0)
MCHC: 35.8 g/dL (ref 30.0–36.0)
MCV: 81.4 fL (ref 80.0–100.0)
Platelets: 272 10*3/uL (ref 150–400)
RBC: 5.39 MIL/uL (ref 4.22–5.81)
RDW: 12.5 % (ref 11.5–15.5)
WBC: 14.8 10*3/uL — ABNORMAL HIGH (ref 4.0–10.5)
nRBC: 0 % (ref 0.0–0.2)

## 2019-12-03 LAB — TROPONIN I (HIGH SENSITIVITY): Troponin I (High Sensitivity): 3 ng/L (ref <18)

## 2019-12-03 MED ORDER — ALBUTEROL SULFATE (2.5 MG/3ML) 0.083% IN NEBU
5.0000 mg | INHALATION_SOLUTION | Freq: Once | RESPIRATORY_TRACT | Status: AC
  Administered 2019-12-03: 5 mg via RESPIRATORY_TRACT
  Filled 2019-12-03: qty 6

## 2019-12-03 NOTE — ED Triage Notes (Signed)
Pt here with SOB that started yesterday. Pt states that his entire family all fell ill at the same time. Pt states rapid covid was negative.

## 2019-12-03 NOTE — Progress Notes (Signed)
Patient in ER This encounter was created in error - please disregard.

## 2019-12-03 NOTE — ED Notes (Signed)
Rainbow drawn sent with dark green and gray on ice.

## 2019-12-04 ENCOUNTER — Encounter: Payer: Self-pay | Admitting: Student

## 2019-12-04 ENCOUNTER — Inpatient Hospital Stay
Admission: EM | Admit: 2019-12-04 | Discharge: 2019-12-08 | DRG: 189 | Disposition: A | Discharge status: Home / Self Care | Payer: 59 | Attending: Internal Medicine | Admitting: Internal Medicine

## 2019-12-04 DIAGNOSIS — F17210 Nicotine dependence, cigarettes, uncomplicated: Secondary | ICD-10-CM | POA: Diagnosis present

## 2019-12-04 DIAGNOSIS — J029 Acute pharyngitis, unspecified: Secondary | ICD-10-CM | POA: Diagnosis present

## 2019-12-04 DIAGNOSIS — J208 Acute bronchitis due to other specified organisms: Secondary | ICD-10-CM | POA: Diagnosis present

## 2019-12-04 DIAGNOSIS — J4541 Moderate persistent asthma with (acute) exacerbation: Secondary | ICD-10-CM | POA: Diagnosis present

## 2019-12-04 DIAGNOSIS — J45901 Unspecified asthma with (acute) exacerbation: Secondary | ICD-10-CM | POA: Diagnosis not present

## 2019-12-04 DIAGNOSIS — J988 Other specified respiratory disorders: Secondary | ICD-10-CM | POA: Diagnosis not present

## 2019-12-04 DIAGNOSIS — J9601 Acute respiratory failure with hypoxia: Secondary | ICD-10-CM | POA: Diagnosis present

## 2019-12-04 DIAGNOSIS — Z7951 Long term (current) use of inhaled steroids: Secondary | ICD-10-CM | POA: Diagnosis not present

## 2019-12-04 DIAGNOSIS — R0602 Shortness of breath: Secondary | ICD-10-CM | POA: Diagnosis present

## 2019-12-04 DIAGNOSIS — K5903 Drug induced constipation: Secondary | ICD-10-CM | POA: Diagnosis present

## 2019-12-04 DIAGNOSIS — F1911 Other psychoactive substance abuse, in remission: Secondary | ICD-10-CM | POA: Diagnosis present

## 2019-12-04 DIAGNOSIS — D721 Eosinophilia, unspecified: Secondary | ICD-10-CM | POA: Diagnosis present

## 2019-12-04 DIAGNOSIS — J9691 Respiratory failure, unspecified with hypoxia: Secondary | ICD-10-CM | POA: Diagnosis present

## 2019-12-04 DIAGNOSIS — Z79899 Other long term (current) drug therapy: Secondary | ICD-10-CM | POA: Diagnosis not present

## 2019-12-04 DIAGNOSIS — B9789 Other viral agents as the cause of diseases classified elsewhere: Secondary | ICD-10-CM

## 2019-12-04 DIAGNOSIS — Z20822 Contact with and (suspected) exposure to covid-19: Secondary | ICD-10-CM | POA: Diagnosis present

## 2019-12-04 DIAGNOSIS — F1120 Opioid dependence, uncomplicated: Secondary | ICD-10-CM | POA: Diagnosis present

## 2019-12-04 DIAGNOSIS — Z7952 Long term (current) use of systemic steroids: Secondary | ICD-10-CM | POA: Diagnosis not present

## 2019-12-04 DIAGNOSIS — T402X5A Adverse effect of other opioids, initial encounter: Secondary | ICD-10-CM | POA: Diagnosis present

## 2019-12-04 LAB — INFLUENZA PANEL BY PCR (TYPE A & B)
Influenza A By PCR: NEGATIVE
Influenza B By PCR: NEGATIVE

## 2019-12-04 LAB — HIV ANTIBODY (ROUTINE TESTING W REFLEX): HIV Screen 4th Generation wRfx: NONREACTIVE

## 2019-12-04 LAB — SARS CORONAVIRUS 2 BY RT PCR (HOSPITAL ORDER, PERFORMED IN ~~LOC~~ HOSPITAL LAB): SARS Coronavirus 2: NEGATIVE

## 2019-12-04 LAB — PROCALCITONIN: Procalcitonin: <0.1 ng/mL

## 2019-12-04 LAB — FIBRIN DERIVATIVES D-DIMER (ARMC ONLY): Fibrin derivatives D-dimer (ARMC): 159.51 ng/mL (FEU) (ref 0.00–499.00)

## 2019-12-04 LAB — TROPONIN I (HIGH SENSITIVITY): Troponin I (High Sensitivity): 3 ng/L (ref <18)

## 2019-12-04 MED ORDER — METHADONE HCL 10 MG/ML PO CONC
115.0000 mg | Freq: Every day | ORAL | Status: DC
  Administered 2019-12-04 – 2019-12-08 (×5): 115 mg via ORAL
  Filled 2019-12-04 (×6): qty 11.5

## 2019-12-04 MED ORDER — IPRATROPIUM-ALBUTEROL 0.5-2.5 (3) MG/3ML IN SOLN
RESPIRATORY_TRACT | Status: AC
  Filled 2019-12-04: qty 9

## 2019-12-04 MED ORDER — ACETAMINOPHEN 325 MG PO TABS
650.0000 mg | ORAL_TABLET | Freq: Four times a day (QID) | ORAL | Status: DC | PRN
  Administered 2019-12-04: 650 mg via ORAL
  Filled 2019-12-04: qty 2

## 2019-12-04 MED ORDER — MENTHOL 3 MG MT LOZG
1.0000 | LOZENGE | OROMUCOSAL | Status: DC | PRN
  Administered 2019-12-05: 3 mg via ORAL
  Filled 2019-12-04 (×3): qty 9

## 2019-12-04 MED ORDER — METHYLPREDNISOLONE SODIUM SUCC 125 MG IJ SOLR
125.0000 mg | Freq: Once | INTRAMUSCULAR | Status: AC
  Administered 2019-12-04: 125 mg via INTRAVENOUS

## 2019-12-04 MED ORDER — GUAIFENESIN-DM 100-10 MG/5ML PO SYRP
5.0000 mL | ORAL_SOLUTION | ORAL | Status: DC | PRN
  Filled 2019-12-04: qty 5

## 2019-12-04 MED ORDER — ALBUTEROL SULFATE (2.5 MG/3ML) 0.083% IN NEBU
2.5000 mg | INHALATION_SOLUTION | RESPIRATORY_TRACT | Status: DC | PRN
  Administered 2019-12-04 – 2019-12-08 (×2): 2.5 mg via RESPIRATORY_TRACT
  Filled 2019-12-04 (×3): qty 3

## 2019-12-04 MED ORDER — PANTOPRAZOLE SODIUM 40 MG PO TBEC
40.0000 mg | DELAYED_RELEASE_TABLET | Freq: Every day | ORAL | Status: DC
  Administered 2019-12-04 – 2019-12-08 (×5): 40 mg via ORAL
  Filled 2019-12-04 (×5): qty 1

## 2019-12-04 MED ORDER — METHADONE HCL 10 MG/5ML PO SOLN
115.0000 mg | Freq: Every morning | ORAL | Status: DC
  Filled 2019-12-04: qty 57.5

## 2019-12-04 MED ORDER — ALBUTEROL SULFATE (2.5 MG/3ML) 0.083% IN NEBU
10.0000 mg | INHALATION_SOLUTION | Freq: Once | RESPIRATORY_TRACT | Status: AC
  Administered 2019-12-04: 10 mg via RESPIRATORY_TRACT
  Filled 2019-12-04: qty 12

## 2019-12-04 MED ORDER — IPRATROPIUM-ALBUTEROL 0.5-2.5 (3) MG/3ML IN SOLN
3.0000 mL | Freq: Once | RESPIRATORY_TRACT | Status: AC
  Administered 2019-12-04: 3 mL via RESPIRATORY_TRACT

## 2019-12-04 MED ORDER — PREDNISONE 20 MG PO TABS: 40.0000 mg | ORAL_TABLET | Freq: Every day | ORAL | Status: DC

## 2019-12-04 MED ORDER — METHYLPREDNISOLONE SODIUM SUCC 125 MG IJ SOLR
INTRAMUSCULAR | Status: AC
  Filled 2019-12-04: qty 2

## 2019-12-04 MED ORDER — METHYLPREDNISOLONE SODIUM SUCC 40 MG IJ SOLR
40.0000 mg | Freq: Four times a day (QID) | INTRAMUSCULAR | Status: AC
  Administered 2019-12-04 (×4): 40 mg via INTRAVENOUS
  Filled 2019-12-04 (×4): qty 1

## 2019-12-04 MED ORDER — MAGNESIUM SULFATE 2 GM/50ML IV SOLN
2.0000 g | Freq: Once | INTRAVENOUS | Status: AC
  Administered 2019-12-04: 2 g via INTRAVENOUS

## 2019-12-04 MED ORDER — ENOXAPARIN SODIUM 40 MG/0.4ML ~~LOC~~ SOLN
40.0000 mg | SUBCUTANEOUS | Status: DC
  Administered 2019-12-04 – 2019-12-05 (×2): 40 mg via SUBCUTANEOUS
  Filled 2019-12-04 (×5): qty 0.4

## 2019-12-04 MED ORDER — IPRATROPIUM-ALBUTEROL 0.5-2.5 (3) MG/3ML IN SOLN
3.0000 mL | Freq: Four times a day (QID) | RESPIRATORY_TRACT | Status: DC
  Administered 2019-12-04 – 2019-12-06 (×10): 3 mL via RESPIRATORY_TRACT
  Filled 2019-12-04 (×10): qty 3

## 2019-12-04 NOTE — Evaluation (Signed)
Physical Therapy Evaluation Patient Details Name: KALIM KISSEL MRN: [id number] DOB: [birth year] Today's Date: 12/04/2019   History of Present Illness  Brodey Bonn is a 66yoM who comes to [facility] on 5/14 after flu like symptoms at home, pt ultiamtely "unable to breath." RVP negative for COVID19, FLUA, FLUB. PMH: asthma. Pt reports enitre family was ill, but his symptoms have persisted whereas the family is now better.  Clinical Impression  Pt in bed upon entry, wife in room. Resting vitals include HR in 120s sustained, SpO2 92% on 5L/min. Pt denies any acute impairment of strength or balance. Pt performed bed mobility and transfers independently, but AMB is limited to mostly marching in place at bed side, inititally due to HR increases to 136bpm, but also due to high O2 needs and [place] length. Pt trialed on room air, inittially remaining in low 90s, but once standing and moving drops to 82-85% before author decides to turn 4L back on. 4L insufficient for sats >88%, pt put back to 5L as received. In stance, HR eventually returns to 120s. At end of session RT arrives, reports pt will likely be moved to a higher flow rate of delivery. Patient is at baseline for strength and balance, all education completed, and time is given to address all questions/concerns. No additional skilled PT services needed at this time, PT signing off. PT recommends daily ambulation ad lib or with nursing staff as needed to prevent deconditioning.      Follow Up Recommendations No PT follow up    Equipment Recommendations  None recommended by PT    Recommendations for Other Services       Precautions / Restrictions Precautions Precautions: None      Mobility  Bed Mobility Overal bed mobility: Independent                Transfers Overall transfer level: Independent                  Ambulation/Gait Ambulation/Gait assistance: Independent Gait Distance (Feet): 10 Feet(HR and Sats warrant limiting  AMB at this time, pt denies any impairment.)            Stairs            Wheelchair Mobility    Modified Rankin (Stroke Patients Only)       Balance Overall balance assessment: Independent                                           Pertinent Vitals/Pain Pain Assessment: 0-10 Pain Score: 2  Pain Location: thoat pain. Pain Descriptors / Indicators: Aching Pain Intervention(s): Limited activity within patient's tolerance;Monitored during session;Premedicated before session    Home Living Family/patient expects to be discharged to:: Private residence Living Arrangements: Spouse/significant other;Children(2 children 12, 14) Available Help at Discharge: Family Type of Home: House Home Access: Stairs to enter Entrance Stairs-Rails: Psychiatric nurse of Steps: 5 Home Layout: One level Home Equipment: None Additional Comments: no baseline mobility impairment, working in instabilation work, crawling, ladders, floor to [employer] trnasition. Aris Everts tuse of Nebs and inhalers    Prior Function Level of Independence: Independent               Hand Dominance   Dominant Hand: Right    Extremity/Trunk Assessment   Upper Extremity Assessment Upper Extremity Assessment: Overall WFL for tasks assessed    Lower Extremity  Assessment Lower Extremity Assessment: Overall WFL for tasks assessed    Cervical / Trunk Assessment Cervical / Trunk Assessment: Normal  Communication      Cognition Arousal/Alertness: Awake/alert Behavior During Therapy: WFL for tasks assessed/performed Overall Cognitive Status: Within Functional Limits for tasks assessed                                        General Comments      Exercises     Assessment/Plan    PT Assessment Patent does not need any further PT services  PT Problem List         PT Treatment Interventions      PT Goals (Current goals can be found in the Care  Plan section)  Acute Rehab PT Goals PT Goal Formulation: All assessment and education complete, DC therapy    Frequency     Barriers to discharge        Co-evaluation               AM-PAC PT "6 Clicks" Mobility  Outcome Measure Help needed turning from your back to your side while in a flat bed without using bedrails?: None Help needed moving from lying on your back to sitting on the side of a flat bed without using bedrails?: None Help needed moving to and from a bed to a chair (including a wheelchair)?: None Help needed standing up from a chair using your arms (e.g., wheelchair or bedside chair)?: None Help needed to walk in hospital room?: None Help needed climbing 3-5 steps with a railing? : None 6 Click Score: 24    End of Session Equipment Utilized During Treatment: Oxygen Activity Tolerance: Patient tolerated treatment well;Treatment limited secondary to medical complications (Comment) Patient left: in bed;with nursing/sitter in room;with family/visitor present Nurse Communication: Mobility status PT Visit Diagnosis: Other abnormalities of gait and mobility (R26.89)    Time: 3009-2330 PT Time Calculation (min) (ACUTE ONLY): 20 min   Charges:   PT Evaluation $PT Eval Low Complexity: 1 Low          2:43 PM, 12/04/19 Rosamaria Lints, PT, DPT Physical Therapist - [facility]  [phone] (ASCOM)    Johnie Makki C 12/04/2019, 2:39 PM

## 2019-12-04 NOTE — ED Provider Notes (Signed)
[facility] Emergency Department Provider Note  ____________________________________________  Time seen: Approximately 1:41 AM  I have reviewed the triage vital signs and the nursing notes.   HISTORY  Chief Complaint Shortness of Breath   HPI Marc Henderson is a 34 y.o. male with a history of asthma and smoking who presents for evaluation of shortness of breath.  Patient reports that his entire household felt sick yesterday.  He has had severe respiratory distress, has been using his inhalers at home with no significant relief.  Has a cough but no hemoptysis.  No fever, has had body aches, no sore throat, no loss of taste or smell, no vomiting or diarrhea.  He is also complaining of sharp pleuritic chest pain that is present with deep inspiration and radiates to his upper back.  Denies any personal or family history of blood clots, recent travel immobilization, leg pain or swelling, hemoptysis or exogenous hormones.  Patient went to [facility] yesterday and had a rapid Covid test which was negative.    Past Medical History:  Diagnosis Date  . Asthma     Patient Active Problem List   Diagnosis Date Noted  . Tobacco use disorder   . Alcohol abuse   . Chronic low back pain   . Moderate persistent asthma without complication 76/73/[id number]    No past surgical history on file.  Prior to Admission medications   Medication Sig Start Date End Date Taking? Authorizing Provider  albuterol (PROAIR HFA) 108 (90 Base) MCG/ACT inhaler Inhale 2 puffs into the lungs every 6 (six) hours as needed for wheezing or shortness of breath. 08/27/19   Horald Pollen, MD  albuterol (PROVENTIL) (2.5 MG/3ML) 0.083% nebulizer solution Take 3 mLs (2.5 mg total) by nebulization every 6 (six) hours as needed for wheezing or shortness of breath. 08/27/19 09/26/19  Horald Pollen, MD  beclomethasone (QVAR REDIHALER) 40 MCG/ACT inhaler Inhale 1 puff into the lungs 2 (two) times daily.  04/20/19   Horald Pollen, MD  furosemide (LASIX) 20 MG tablet Take 1 tablet (20 mg total) by mouth daily for 14 days. 11/18/18 03/11/19  Harvest Dark, MD  omeprazole (PRILOSEC) 10 MG capsule Take 1 capsule (10 mg total) by mouth daily. Patient not taking: Reported on 12/03/2019 04/24/19   Cuthriell, Charline Bills, PA-C    Allergies Patient has no known allergies.  No family history on file.  Social History Social History   Tobacco Use  . Smoking status: Current Every Day Smoker    Packs/day: 1.00    Years: 8.00    Pack years: 8.00  . Smokeless tobacco: Never Used  Substance Use Topics  . Alcohol use: Yes    Comment: 1-2 beer/daily  . Drug use: No    Review of Systems  Constitutional: Negative for fever. + body aches Eyes: Negative for visual changes. ENT: Negative for sore throat. Neck: No neck pain  Cardiovascular: Negative for chest pain. Respiratory: + shortness of breath, cough Gastrointestinal: Negative for abdominal pain, vomiting or diarrhea. Genitourinary: Negative for dysuria. Musculoskeletal: Negative for back pain. Skin: Negative for rash. Neurological: Negative for headaches, weakness or numbness. Psych: No SI or HI  ____________________________________________   PHYSICAL EXAM:  VITAL SIGNS: ED Triage Vitals  Enc Vitals Group     BP 12/03/19 1733 127/84     Pulse Rate 12/03/19 1733 (!) 105     Resp 12/03/19 1733 18     Temp 12/03/19 1733 (!) 97.5 F (36.4 C)  Temp Source 12/03/19 1733 Oral     SpO2 12/03/19 1733 94 %     Weight 12/03/19 1733 260 lb (117.9 kg)     Height 12/03/19 1733 5\' 11"  (1.803 m)     Head Circumference --      Peak Flow --      Pain Score 12/03/19 1749 7     Pain Loc --      Pain Edu? --      Excl. in GC? --     Constitutional: Alert and oriented, moderate respiratory distress.  HEENT:      Head: Normocephalic and atraumatic.         Eyes: Conjunctivae are normal. Sclera is non-icteric.       Mouth/Throat:  Mucous membranes are moist.       Neck: Supple with no signs of meningismus. Cardiovascular: Tachycardic with regular rhythm, no murmurs. No JVD. Respiratory: Tachypneic, hypoxic to 88% on room air with diffuse wheezing throughout, lung sounds very tight on auscultation Gastrointestinal: Soft, non tender. Musculoskeletal:  No edema, cyanosis, or erythema of extremities. Neurologic: Normal speech and language. Face is symmetric. Moving all extremities. No gross focal neurologic deficits are appreciated. Skin: Skin is warm, dry and intact. No rash noted. Psychiatric: Mood and affect are normal. Speech and behavior are normal.  ____________________________________________   LABS (all labs ordered are listed, but only abnormal results are displayed)  Labs Reviewed  CBC - Abnormal; Notable for the following components:      Result Value   WBC 14.8 (*)    All other components within normal limits  BASIC METABOLIC PANEL - Abnormal; Notable for the following components:   Glucose, Bld 128 (*)    All other components within normal limits  SARS CORONAVIRUS 2 BY RT PCR (HOSPITAL ORDER, PERFORMED IN  HOSPITAL LAB)  PROCALCITONIN  FIBRIN DERIVATIVES D-DIMER ([facility] ONLY)  TROPONIN I (HIGH SENSITIVITY)  TROPONIN I (HIGH SENSITIVITY)   ____________________________________________  EKG  Sinus tachycardia, rate of 115, normal intervals, normal axis, no ST elevations or depressions.  Otherwise normal. ____________________________________________  RADIOLOGY  I have personally reviewed the images performed during this visit and I agree with the Radiologist's read.   Interpretation by Radiologist:  DG Chest 2 View  Result Date: 12/03/2019 CLINICAL DATA:  Shortness of breath EXAM: CHEST - 2 VIEW COMPARISON:  11/20/2018 FINDINGS: Cardiac shadows within normal limits. The lungs are well aerated with mild chronic interstitial changes stable from previous exams. No focal infiltrate or  sizable effusion is seen. No bony abnormality is noted. IMPRESSION: Chronic interstitial changes without acute abnormality. Electronically Signed   By: 11/22/2018 M.D.   On: 12/03/2019 18:38     ____________________________________________   PROCEDURES  Procedure(s) performed:yes .1-3 Lead EKG Interpretation Performed by: 12/05/2019, MD Authorized by: Nita Sickle, MD     Interpretation: normal     ECG rate assessment: tachycardic     Rhythm: sinus tachycardia     Ectopy: none     Conduction: normal     Critical Care performed: yes  CRITICAL CARE Performed by: Nita Sickle  ?  Total critical care time: 45 min  Critical care time was exclusive of separately billable procedures and treating other patients.  Critical care was necessary to treat or prevent imminent or life-threatening deterioration.  Critical care was time spent personally by me on the following activities: development of treatment plan with patient and/or surrogate as well as nursing, discussions with consultants, evaluation of  patient's response to treatment, examination of patient, obtaining history from patient or surrogate, ordering and performing treatments and interventions, ordering and review of laboratory studies, ordering and review of radiographic studies, pulse oximetry and re-evaluation of patient's condition.  ____________________________________________   INITIAL IMPRESSION / ASSESSMENT AND PLAN / ED COURSE  34 y.o. male with a history of asthma and smoking who presents for evaluation of shortness of breath, wheezing, cough, body aches.  Several household members with similar symptoms since yesterday.  Patient is in moderate respiratory distress, hypoxic to 88 with diffuse wheezing throughout and sounds very tight on auscultation.    Patient was started immediately on 3 duo nebs, IV Solu-Medrol, and IV magnesium.  Differential diagnosis including Covid versus viral illness  versus PNA versus asthma exacerbation vs PTX vs PE.  EKG showing sinus tachycardia with no acute ischemic changes.  Patient placed on telemetry for close monitoring.  Old medical records reviewed.   Chest x-ray visualized and interpreted by me showing no evidence of pneumothorax, edema, or infiltrate.  Confirmed by radiology.  Labs show leukocytosis with white count of 14.8, no electrolyte derangements, mild hyperglycemia with no known history of diabetes.  Troponin x2 -.  D-dimer negative.  Patient reassessed after 3 duo nebs, IV Solu-Medrol and IV magnesium still with persistent wheezing, satting in the low 90s on 6 L nasal cannula.  Patient will be started on BiPAP.  Will be placed on continuous albuterol.  Patient be admitted to the hospitalist service.  _________________________ 2:26 AM on 12/04/2019 -----------------------------------------  Procalcitonin negative.  Covid negative.  Patient looks much more comfortable on BiPAP.  Will admit to the hospitalist service.     _____________________________________________ Please note:  Patient was evaluated in Emergency Department today for the symptoms described in the history of present illness. Patient was evaluated in the context of the global COVID-19 pandemic, which necessitated consideration that the patient might be at risk for infection with the SARS-CoV-2 virus that causes COVID-19. Institutional protocols and algorithms that pertain to the evaluation of patients at risk for COVID-19 are in a state of rapid change based on information released by regulatory bodies including the CDC and federal and state organizations. These policies and algorithms were followed during the patient's care in the ED.  Some ED evaluations and interventions may be delayed as a result of limited staffing during the pandemic.   [place] Controlled Substance Database was reviewed by me. ____________________________________________   FINAL CLINICAL IMPRESSION(S) /  ED DIAGNOSES   Final diagnoses:  Acute respiratory failure with hypoxia (HCC)  Severe asthma with exacerbation, unspecified whether persistent      NEW MEDICATIONS STARTED DURING THIS VISIT:  ED Discharge Orders    None       Note:  This document was prepared using Dragon voice recognition software and may include unintentional dictation errors.    Don Perking, [place], MD 12/04/19 [phone]

## 2019-12-04 NOTE — ED Notes (Signed)
Report given to Jenna E RN 

## 2019-12-04 NOTE — ED Notes (Signed)
Patient denies any complaints at this time. Patient states breathing has improved. Patient has a dinner tray and has been watching his laptop without complaints.

## 2019-12-04 NOTE — Plan of Care (Signed)
  Problem: Education: Goal: Knowledge of  General Education information/materials will improve Outcome: Progressing Goal: Emotional status will improve Outcome: Progressing Goal: Mental status will improve Outcome: Progressing Goal: Verbalization of understanding the information provided will improve Outcome: Progressing   Problem: Activity: Goal: Interest or engagement in activities will improve Outcome: Progressing Goal: Sleeping patterns will improve Outcome: Progressing   

## 2019-12-04 NOTE — ED Notes (Signed)
Patient is eating lunch tray without difficulty. Significant other at bedside. Patient's pulse ox is in the low 90's when sitting upright, but stays at 88-90 when reclining on hospital stretcher or while eating. Hospital bed has been requested.

## 2019-12-04 NOTE — ED Notes (Signed)
Patient is trying to rest.  Ox 91%on 4 liters.

## 2019-12-04 NOTE — ED Notes (Signed)
Pt currently sleeping at this time. No distress noted.

## 2019-12-04 NOTE — ED Notes (Signed)
Patient asked for breathing treatment. Patient was given Albuterol neb.

## 2019-12-04 NOTE — ED Notes (Signed)
Pt sats at 88-90% on RA, placed on 2LPM [place] and increased to 90%. Upped to 4LPM and sats at 92%

## 2019-12-04 NOTE — ED Notes (Signed)
Pt reports slight improvement with BIPAP. Pt looks better with his breathing at this time. Lights dimmed, pt has cell phone and call light in reach

## 2019-12-04 NOTE — ED Notes (Signed)
Pt remains asleep, this nurse entered room and pt woke up. Pt states improvement in resp effort. Pt reports wanting to go to sleep and will put BIPAP on once awake.

## 2019-12-04 NOTE — ED Notes (Signed)
Assigned bed @ [id number], spoke with RN Dora Sims.

## 2019-12-04 NOTE — ED Notes (Signed)
RT at bedside.

## 2019-12-04 NOTE — Consult Note (Addendum)
Spoke with dosing Tammy, dosing clinician at [facility] [address] [place], [place], [place] [address].  Ph [phone].  Confirmed once daily methadone dose is 115mg  qd.  Prescribing physician is .  Pt received last dose in clinic 12/02/19 with a single take-home dose for 12/03/19.  Attending will need to evaluate and place order as clinically appropriate.  Per Tammy, pt will need documentation of doses received while inpatient to continue care at [facility] after discharge.  Thank you for allowing pharmacy to be a part of this patients continuation of care.  [facility], PharmD, BCPS Clinical Pharmacist 12/04/2019 7:47 AM

## 2019-12-04 NOTE — ED Notes (Signed)
Pt requests bipap to be taken off due to inability to rest or sleep. Pt removed on own when nurse was in room. Pt placed on 4LPM [place]. Pt has decreased difficulty breathing at this time and states he feels improvement but not relief. Will continue to monitor. Dr. Don Perking notified.

## 2019-12-04 NOTE — ED Notes (Signed)
I have been to patient room several times this am.  He has pulse ox >90 if he does not move around much.  I explained that he needs to conserve energy.  When I tell him pulse ox value he says he's okay.  He has remained alert.

## 2019-12-04 NOTE — Progress Notes (Signed)
OT Cancellation Note  Patient Details Name: ESTELL DILLINGER MRN: [id number] DOB: 1985-08-15   Cancelled Treatment:    Reason Eval/Treat Not Completed: OT screened, no needs identified, will sign off. OT order received and chart reviewed. Per conversation c PT and RN, pt is at baseline for ADLs. Pt has no identified skilled OT needs. Will sogn off at this time. Please re-consult if new needs arise.   Kathie Dike, M.S. OTR/L  12/04/19, 4:47 PM

## 2019-12-04 NOTE — Progress Notes (Addendum)
[facility] Progress Note  Patient: Marc Henderson    BJS:[id number]  DOA: 12/04/2019     Date of Service: the patient was seen and examined on 12/04/2019  Chief Complaint  Patient presents with  . Shortness of Breath   Brief hospital course: Marc Henderson is a 34 y.o. male with medical history significant for moderate persistent asthma and tobacco use and in the methadone clinic who presents to the emergency room with a 2-day history of shortness of breath not responding adequately to home inhalers.  Patient also complains of body aches but denies fever, cough, headache.  He also has a sharp chest pain with coughing radiating from sternum to upper back.  Denies leg pain or swelling, denies recent travel or immobilization.  States his whole household is affected.  He went to the pharmacy yesterday and got a rapid Covid test which was negative.  ED Course: On arrival in the emergency room patient was tachypneic and tachycardic with increased work of breathing.  O2 sat was 88% on room air.  He was treated with duo nebs, Solu-Medrol and high flow oxygen subsequently transitioning to BiPAP.  He had a mildly elevated white cell count of 15,000.  Procalcitonin less than 0.10.  He did have an elevated fibrin derivatives of 159.  His other labs were unremarkable.  Covid test negative.  Chest x-ray showed chronic interstitial changes without acute abnormality.  Patient improved with BiPAP.  Hospitalist consulted for admission    Currently further plan is to continue steroids, supplemental oxygen and gradually wean off.  Probably discharge in 1 to 2 days.  Assessment and Plan: Principal Problem:   Acute respiratory failure with hypoxia (HCC) -O2 sat 88% on room air improving to 94 on high flow but subsequently placed on BiPAP due to increased work of breathing -Suspect related to either viral bronchitis or acute asthma exacerbation -Covid PCR negative.  flu test negative -Was on BiPAP in  the ER now weaned to O2 via nasal cannula  -Continue to monitor -Treat underlying cause     Acute viral bronchitis   Asthma exacerbation, non-allergic, moderate persistent -Patient with wheezing either due to asthma exacerbation but possible viral bronchitis given affected contacts and symptoms of muscle aches and cough -Covid PCR was negative.  Follow flu test -Procalcitonin was less than 0.1 -Continue scheduled and as needed nebulized bronchodilator treatments -IV Solu-Medrol -Patient received IV magnesium in the emergency room  Methadone dependence -Patient attends methadone clinic, dose verified by pharmacy --Resumed methadone 115 mg p.o. daily    Body mass index is 36.26 kg/m.  Interventions:    Admission status changed from observation to inpatient as patient will require more than 2 midnight stay in the hospital.   Diet: Regular DVT Prophylaxis: Subcutaneous Lovenox   Advance goals of care discussion: Full code  Family Communication: no family was present at bedside, at the time of interview.  The pt provided permission to discuss medical plan with the family. Opportunity was given to ask question and all questions were answered satisfactorily.   Disposition:  Pt is from Home, admitted with acute respiratory failure, asthma exacerbation, still has respiratory failure requiring 4 L oxygen, which precludes a safe discharge. Discharge to home, when respiratory failure resolves and oxygen weaned off.  It may take 1 or 2 days more.  Subjective: Patient was seen and examined in the ER, admitted due to respiratory failure/asthma exacerbation.  Patient feels improvement after breathing treatment and steroids.  Still patient  requiring 4 L oxygen via nasal cannula, short of breath.  Denied any chest pain or palpitations, no abdominal pain. Complaining of some sore throat secondary to cough  Physical Exam: General:  alert oriented to time, place, and person.  Appear in mild  distress, affect appropriate Eyes: PERRLA ENT: Oral Mucosa Clear, moist  Neck: no JVD,  Cardiovascular: S1 and S2 Present, no Murmur,  Respiratory: good respiratory effort, Bilateral Air entry equal,  No Crackles, B/L wheezes Abdomen: Bowel Sound present, Soft and no tenderness,  Skin: no rashes Extremities: no Pedal edema, no calf tenderness Neurologic: without any new focal findings Gait not checked due to patient safety concerns  Vitals:   12/04/19 1115 12/04/19 1130 12/04/19 1200 12/04/19 1230  BP: 125/75 117/73 132/76 135/79  Pulse: (!) 108 (!) 112 (!) 105 (!) 107  Resp: (!) 21 (!) 28 (!) 25 (!) 23  Temp:      TempSrc:      SpO2: (!) 88% 90% 90% (!) 87%  Weight:      Height:        Intake/Output Summary (Last 24 hours) at 12/04/2019 1330 Last data filed at 12/04/2019 0830 Gross per 24 hour  Intake --  Output 400 ml  Net -400 ml   Filed Weights   12/03/19 1733  Weight: 117.9 kg    Data Reviewed: I have personally reviewed and interpreted daily labs, tele strips, imagings as discussed above. I reviewed all nursing notes, pharmacy notes, vitals, pertinent old records I have discussed plan of care as described above with RN and patient/family.  CBC: Recent Labs  Lab 12/03/19 1745  WBC 14.8*  HGB 15.7  HCT 43.9  MCV 81.4  PLT 161   Basic Metabolic Panel: Recent Labs  Lab 12/03/19 1745  NA 137  K 4.1  CL 103  CO2 24  GLUCOSE 128*  BUN 10  CREATININE 0.90  CALCIUM 9.2    Studies: DG Chest 2 View  Result Date: 12/03/2019 CLINICAL DATA:  Shortness of breath EXAM: CHEST - 2 VIEW COMPARISON:  11/20/2018 FINDINGS: Cardiac shadows within normal limits. The lungs are well aerated with mild chronic interstitial changes stable from previous exams. No focal infiltrate or sizable effusion is seen. No bony abnormality is noted. IMPRESSION: Chronic interstitial changes without acute abnormality. Electronically Signed   By: Inez Catalina M.D.   On: 12/03/2019 18:38     Scheduled Meds: . enoxaparin (LOVENOX) injection  40 mg Subcutaneous Q24H  . ipratropium-albuterol  3 mL Nebulization Q6H  . methadone  115 mg Oral Daily  . methylPREDNISolone (SOLU-MEDROL) injection  40 mg Intravenous Q6H   Followed by  . [START ON 12/05/2019] predniSONE  40 mg Oral Q breakfast  . pantoprazole  40 mg Oral Daily   Continuous Infusions: PRN Meds: acetaminophen, albuterol  Time spent: 35 minutes  Author: Val Riles. MD [facility] 12/04/2019 1:30 PM  To reach On-call, see care teams to locate the attending and reach out to them via [email or web address]. If 7PM-7AM, please contact night-coverage If you still have difficulty reaching the attending provider, please page the [facility] (Director on Call) for [facility] on amion for assistance.

## 2019-12-04 NOTE — H&P (Signed)
History and Physical    Marc Henderson FUX:[id number] DOB: [birth year] DOA: 12/04/2019  PCP: Georgina Quint, MD   Patient coming from: Home  I have personally briefly reviewed patient's old medical records in [facility]  Chief Complaint: Shortness of breath, wheezing  HPI: Marc Henderson is a 34 y.o. male with medical history significant for moderate persistent asthma and tobacco use and in the methadone clinic who presents to the emergency room with a 2-day history of shortness of breath not responding adequately to home inhalers.  Patient also complains of body aches but denies fever, cough, headache.  He also has a sharp chest pain with coughing radiating from sternum to upper back.  Denies leg pain or swelling, denies recent travel or immobilization.  States his whole household is affected.  He went to the pharmacy yesterday and got a rapid Covid test which was negative.  ED Course: On arrival in the emergency room patient was tachypneic and tachycardic with increased work of breathing.  O2 sat was 88% on room air.  He was treated with duo nebs, Solu-Medrol and high flow oxygen subsequently transitioning to BiPAP.  He had a mildly elevated white cell count of 15,000.  Procalcitonin less than 0.10.  He did have an elevated fibrin derivatives of 159.  His other labs were unremarkable.  Covid test negative.  Chest x-ray showed chronic interstitial changes without acute abnormality.  Patient improved with BiPAP.  Hospitalist consulted for admission   review of Systems: As per HPI otherwise 10 point review of systems negative.    Past Medical History:  Diagnosis Date  . Asthma     No past surgical history on file.   reports that he has been smoking. He has a 8.00 pack-year smoking history. He has never used smokeless tobacco. He reports current alcohol use. He reports that he does not use drugs.  No Known Allergies  No family history on file.   Prior to Admission  medications   Medication Sig Start Date End Date Taking? Authorizing Provider  albuterol (PROAIR HFA) 108 (90 Base) MCG/ACT inhaler Inhale 2 puffs into the lungs every 6 (six) hours as needed for wheezing or shortness of breath. 08/27/19   Georgina Quint, MD  albuterol (PROVENTIL) (2.5 MG/3ML) 0.083% nebulizer solution Take 3 mLs (2.5 mg total) by nebulization every 6 (six) hours as needed for wheezing or shortness of breath. 08/27/19 09/26/19  Georgina Quint, MD  beclomethasone (QVAR REDIHALER) 40 MCG/ACT inhaler Inhale 1 puff into the lungs 2 (two) times daily. 04/20/19   Georgina Quint, MD  furosemide (LASIX) 20 MG tablet Take 1 tablet (20 mg total) by mouth daily for 14 days. 11/18/18 03/11/19  Minna Antis, MD  omeprazole (PRILOSEC) 10 MG capsule Take 1 capsule (10 mg total) by mouth daily. Patient not taking: Reported on 12/03/2019 04/24/19   Racheal Patches, PA-C    Physical Exam: Vitals:   12/04/19 0115 12/04/19 0130 12/04/19 0200 12/04/19 0230  BP: 140/68 134/73 (!) 125/99 132/81  Pulse: (!) 121 (!) 119 (!) 114 (!) 101  Resp: (!) 31 18 (!) 29 (!) 33  Temp:      TempSrc:      SpO2: 92% 93% (!) 89% 95%  Weight:      Height:         Vitals:   12/04/19 0115 12/04/19 0130 12/04/19 0200 12/04/19 0230  BP: 140/68 134/73 (!) 125/99 132/81  Pulse: (!) 121 (!) 119 Marland Kitchen)  114 (!) 101  Resp: (!) 31 18 (!) 29 (!) 33  Temp:      TempSrc:      SpO2: 92% 93% (!) 89% 95%  Weight:      Height:        Constitutional: Alert and awake, oriented x3, not in any acute distress. Eyes: PERLA, EOMI, irises appear normal, anicteric sclera,  ENMT: external ears and nose appear normal, normal hearing             Lips appears normal, Neck: neck appears normal, no masses, normal ROM, no thyromegaly, no JVD  CVS: S1-S2 clear, no murmur rubs or gallops,  , no carotid bruits, pedal pulses palpable, No LE edema Respiratory: Bilateral wheezing and rhonchi with increased respiratory  effort Abdomen: soft nontender, nondistended, normal bowel sounds, no hepatosplenomegaly, no hernias Musculoskeletal: : no cyanosis, clubbing , no contractures or atrophy Neuro: Cranial nerves II-XII intact, sensation, reflexes normal, strength Psych: judgement and insight appear normal, stable mood and affect,  Skin: no rashes or lesions or ulcers, no induration or nodules   Labs on Admission: I have personally reviewed following labs and imaging studies  CBC: Recent Labs  Lab 12/03/19 1745  WBC 14.8*  HGB 15.7  HCT 43.9  MCV 81.4  PLT 272   Basic Metabolic Panel: Recent Labs  Lab 12/03/19 1745  NA 137  K 4.1  CL 103  CO2 24  GLUCOSE 128*  BUN 10  CREATININE 0.90  CALCIUM 9.2   GFR: Estimated Creatinine Clearance: 153.8 mL/min (by C-G formula based on SCr of 0.9 mg/dL). Liver Function Tests: No results for input(s): AST, ALT, ALKPHOS, BILITOT, PROT, ALBUMIN in the last 168 hours. No results for input(s): LIPASE, AMYLASE in the last 168 hours. No results for input(s): AMMONIA in the last 168 hours. Coagulation Profile: No results for input(s): INR, PROTIME in the last 168 hours. Cardiac Enzymes: No results for input(s): CKTOTAL, CKMB, CKMBINDEX, TROPONINI in the last 168 hours. BNP (last 3 results) No results for input(s): PROBNP in the last 8760 hours. HbA1C: No results for input(s): HGBA1C in the last 72 hours. CBG: No results for input(s): GLUCAP in the last 168 hours. Lipid Profile: No results for input(s): CHOL, HDL, LDLCALC, TRIG, CHOLHDL, LDLDIRECT in the last 72 hours. Thyroid Function Tests: No results for input(s): TSH, T4TOTAL, FREET4, T3FREE, THYROIDAB in the last 72 hours. Anemia Panel: No results for input(s): VITAMINB12, FOLATE, FERRITIN, TIBC, IRON, RETICCTPCT in the last 72 hours. Urine analysis:    Component Value Date/Time   COLORURINE YELLOW (A) 04/24/2019 1605   APPEARANCEUR CLEAR (A) 04/24/2019 1605   APPEARANCEUR Clear 02/16/2014 1206    LABSPEC 1.026 04/24/2019 1605   LABSPEC 1.016 02/16/2014 1206   PHURINE 6.0 04/24/2019 1605   GLUCOSEU NEGATIVE 04/24/2019 1605   GLUCOSEU Negative 02/16/2014 1206   HGBUR NEGATIVE 04/24/2019 1605   BILIRUBINUR NEGATIVE 04/24/2019 1605   BILIRUBINUR Negative 02/16/2014 1206   KETONESUR NEGATIVE 04/24/2019 1605   PROTEINUR NEGATIVE 04/24/2019 1605   NITRITE NEGATIVE 04/24/2019 1605   LEUKOCYTESUR NEGATIVE 04/24/2019 1605   LEUKOCYTESUR Negative 02/16/2014 1206    Radiological Exams on Admission: DG Chest 2 View  Result Date: 12/03/2019 CLINICAL DATA:  Shortness of breath EXAM: CHEST - 2 VIEW COMPARISON:  11/20/2018 FINDINGS: Cardiac shadows within normal limits. The lungs are well aerated with mild chronic interstitial changes stable from previous exams. No focal infiltrate or sizable effusion is seen. No bony abnormality is noted. IMPRESSION: Chronic interstitial changes  without acute abnormality. Electronically Signed   By: Inez Catalina M.D.   On: 12/03/2019 18:38    EKG: Independently reviewed.   Assessment/Plan Principal Problem:   Acute respiratory failure with hypoxia (HCC) -O2 sat 88% on room air improving to 94 on high flow but subsequently placed on BiPAP due to increased work of breathing -Suspect related to either viral bronchitis or acute asthma exacerbation -Covid PCR negative.  Follow flu test -Was on BiPAP in the ER now weaned to O2 via nasal cannula  -Continue to monitor -Treat underlying cause    Acute viral bronchitis   Asthma exacerbation, non-allergic, moderate persistent -Patient with wheezing either due to asthma exacerbation but possible viral bronchitis given affected contacts and symptoms of muscle aches and cough -Covid PCR was negative.  Follow flu test -Procalcitonin was less than 0.1 -Continue scheduled and as needed nebulized bronchodilator treatments -IV Solu-Medrol -Patient received IV magnesium in the emergency room  Methadone dependence  -Patient attends methadone clinic -Pharmacy consult for continuation of methadone    DVT prophylaxis: Lovenox  Code Status: full code  Family Communication:  none  Disposition Plan: Back to previous home environment Consults called: none  Status:obs    Athena Masse MD [facility]     12/04/2019, 2:41 AM

## 2019-12-05 DIAGNOSIS — J208 Acute bronchitis due to other specified organisms: Secondary | ICD-10-CM

## 2019-12-05 DIAGNOSIS — J4541 Moderate persistent asthma with (acute) exacerbation: Secondary | ICD-10-CM

## 2019-12-05 DIAGNOSIS — F1120 Opioid dependence, uncomplicated: Secondary | ICD-10-CM

## 2019-12-05 LAB — CBC
HCT: 42.4 % (ref 39.0–52.0)
Hemoglobin: 14.8 g/dL (ref 13.0–17.0)
MCH: 29.4 pg (ref 26.0–34.0)
MCHC: 34.9 g/dL (ref 30.0–36.0)
MCV: 84.3 fL (ref 80.0–100.0)
Platelets: 283 10*3/uL (ref 150–400)
RBC: 5.03 MIL/uL (ref 4.22–5.81)
RDW: 13 % (ref 11.5–15.5)
WBC: 22.9 10*3/uL — ABNORMAL HIGH (ref 4.0–10.5)
nRBC: 0 % (ref 0.0–0.2)

## 2019-12-05 LAB — BASIC METABOLIC PANEL
Anion gap: 7 (ref 5–15)
BUN: 22 mg/dL — ABNORMAL HIGH (ref 6–20)
CO2: 26 mmol/L (ref 22–32)
Calcium: 9.7 mg/dL (ref 8.9–10.3)
Chloride: 104 mmol/L (ref 98–111)
Creatinine, Ser: 0.98 mg/dL (ref 0.61–1.24)
GFR calc Af Amer: >60 mL/min (ref >60)
GFR calc non Af Amer: >60 mL/min (ref >60)
Glucose, Bld: 134 mg/dL — ABNORMAL HIGH (ref 70–99)
Potassium: 5.1 mmol/L (ref 3.5–5.1)
Sodium: 137 mmol/L (ref 135–145)

## 2019-12-05 LAB — PHOSPHORUS: Phosphorus: 2.7 mg/dL (ref 2.5–4.6)

## 2019-12-05 LAB — MAGNESIUM: Magnesium: 2.3 mg/dL (ref 1.7–2.4)

## 2019-12-05 MED ORDER — PRO-STAT SUGAR FREE PO LIQD
30.0000 mL | Freq: Two times a day (BID) | ORAL | Status: DC
  Administered 2019-12-05 – 2019-12-08 (×7): 30 mL via ORAL

## 2019-12-05 MED ORDER — METHYLPREDNISOLONE SODIUM SUCC 40 MG IJ SOLR
40.0000 mg | Freq: Three times a day (TID) | INTRAMUSCULAR | Status: DC
  Administered 2019-12-05 – 2019-12-06 (×3): 40 mg via INTRAVENOUS
  Filled 2019-12-05 (×3): qty 1

## 2019-12-05 MED ORDER — ENSURE ENLIVE PO LIQD
237.0000 mL | Freq: Two times a day (BID) | ORAL | Status: DC
  Administered 2019-12-05 – 2019-12-07 (×5): 237 mL via ORAL

## 2019-12-05 MED ORDER — SALINE SPRAY 0.65 % NA SOLN
1.0000 | NASAL | Status: DC | PRN
  Administered 2019-12-05: 1 via NASAL
  Filled 2019-12-05: qty 44

## 2019-12-05 MED ORDER — ADULT MULTIVITAMIN W/MINERALS CH
1.0000 | ORAL_TABLET | Freq: Every day | ORAL | Status: DC
  Administered 2019-12-05 – 2019-12-08 (×4): 1 via ORAL
  Filled 2019-12-05 (×4): qty 1

## 2019-12-05 NOTE — Progress Notes (Signed)
[facility]  PROGRESS NOTE  Marc Henderson IOE:[id number] DOB: 13-Feb-1986 DOA: 12/04/2019 PCP: Georgina Quint, MD Admit date - 12/04/2019   Admitting Physician Gillis Santa, MD  Outpatient Primary MD for the patient is Georgina Quint, MD  LOS - 1 Brief Narrative   Marc Henderson is a 34 y.o. year old male with medical history significant for moderate persistent asthma and tobacco abuse who presented on 12/04/2019 with 2-day history of shortness of breath with recent sick contacts including family, prior to present to ED underwent rapid Covid test at local pharmacy which was negative and was found to have SPO2 of 88% on room air with persistent hypoxia requiring high flow oxygen as well as transitioning to BiPAP. Blood work notable for WBC of 15, procalcitonin less than 0.10, Covid testing negative, chest x-ray showed chronic interstitial changes without any acute abnormalities. Patient's O2 status improved on BiPAP and was able to wean to nasal cannula home  Hospital course complicated by fairly high O2 requirements  Subjective  Today feels his breathing is much better compared to yesterday still has cough and notable wheezing  A & P    Acute hypoxic respiratory failure secondary to asthma exacerbation, suspect acute viral bronchitis (recent sick contacts, nonacute chest x-ray, Covid/flutesting negative) improving. Previous records were reviewed high fall, has been able to downtrend to 3 L -Continue O2 to maintain SPO2 greater than 92% -Continue IV steroids, scheduled duo nebs -If continues to improve may be able to transition to prednisone in 24 hours -Encourage flutter valve, incentive spirometry -Encourage tobacco use cessation  Leukocytosis, likely due to steroids. Suspect demargination related to IV Solu-Medrol. Afebrile with no localizing signs or symptoms of infection, chest x-ray nonacute -Serial CBCs  Methadone dependence Continue methadone 15 mg p.o.  daily per pharmacy -Continue methadone as outpatient. Methadone clinic once able to discharge safely   Family Communication  : No family at bedside  Code Status : Full  Disposition Plan  :  Patient is from home. Anticipated d/c date: 2 to 3 days. Barriers to d/c or necessity for inpatient status:  Still requiring IV Solu-Medrol, scheduled DuoNebs and supplemental O2 in setting of asthma exacerbation Consults  : None  Procedures  : None  DVT Prophylaxis  :  Lovenox   Lab Results  Component Value Date   PLT 283 12/05/2019    Diet :  Diet Order            Diet regular Room service appropriate? Yes; Fluid consistency: Thin  Diet effective now               Inpatient Medications Scheduled Meds: . enoxaparin (LOVENOX) injection  40 mg Subcutaneous Q24H  . feeding supplement (ENSURE ENLIVE)  237 mL Oral BID BM  . feeding supplement (PRO-STAT SUGAR FREE 64)  30 mL Oral BID  . ipratropium-albuterol  3 mL Nebulization Q6H  . methadone  115 mg Oral Daily  . methylPREDNISolone (SOLU-MEDROL) injection  40 mg Intravenous Q8H  . multivitamin with minerals  1 tablet Oral Daily  . pantoprazole  40 mg Oral Daily   Continuous Infusions: PRN Meds:.acetaminophen, albuterol, guaiFENesin-dextromethorphan, menthol-cetylpyridinium, sodium chloride  Antibiotics  :   Anti-infectives (From admission, onward)   None       Objective   Vitals:   12/05/19 0739 12/05/19 1100 12/05/19 2016 12/05/19 2148  BP:  124/82  115/73  Pulse:  79  87  Resp:  18  18  Temp:  98  F (36.7 C)  98.5 F (36.9 C)  TempSrc:  Oral  Oral  SpO2: 95% 94% 95% 94%  Weight:      Height:        SpO2: 94 % O2 Flow Rate (L/min): 3 L/min FiO2 (%): 50 %  Wt Readings from Last 3 Encounters:  12/03/19 117.9 kg  12/03/19 117.9 kg  08/27/19 120.2 kg    No intake or output data in the 24 hours ending 12/05/19 2214  Physical Exam:     Awake Alert, Oriented X 3, Normal affect No new F.N deficits,   [place].AT, Normal respiratory effort on three liters, diffuse wheezing in all lung fields, diminished breath sounds at bases, no conversational dyspnea or increased work of breathing RRR,No Gallops,Rubs or new Murmurs,  +ve B.Sounds, Abd Soft, No tenderness, No rebound, guarding or rigidity. No Cyanosis, No new Rash or bruise     I have personally reviewed the following:   Data Reviewed:  CBC Recent Labs  Lab 12/03/19 1745 12/05/19 0411  WBC 14.8* 22.9*  HGB 15.7 14.8  HCT 43.9 42.4  PLT 272 283  MCV 81.4 84.3  MCH 29.1 29.4  MCHC 35.8 34.9  RDW 12.5 13.0    Chemistries  Recent Labs  Lab 12/03/19 1745 12/05/19 0411  NA 137 137  K 4.1 5.1  CL 103 104  CO2 24 26  GLUCOSE 128* 134*  BUN 10 22*  CREATININE 0.90 0.98  CALCIUM 9.2 9.7  MG  --  2.3   ------------------------------------------------------------------------------------------------------------------ No results for input(s): CHOL, HDL, LDLCALC, TRIG, CHOLHDL, LDLDIRECT in the last 72 hours.  No results found for: HGBA1C ------------------------------------------------------------------------------------------------------------------ No results for input(s): TSH, T4TOTAL, T3FREE, THYROIDAB in the last 72 hours.  Invalid input(s): FREET3 ------------------------------------------------------------------------------------------------------------------ No results for input(s): VITAMINB12, FOLATE, FERRITIN, TIBC, IRON, RETICCTPCT in the last 72 hours.  Coagulation profile No results for input(s): INR, PROTIME in the last 168 hours.  No results for input(s): DDIMER in the last 72 hours.  Cardiac Enzymes No results for input(s): CKMB, TROPONINI, MYOGLOBIN in the last 168 hours.  Invalid input(s): CK ------------------------------------------------------------------------------------------------------------------    Component Value Date/Time   BNP 15.0 11/17/2018 2034    Micro Results Recent Results  (from the past 240 hour(s))  SARS Coronavirus 2 by RT PCR (hospital order, performed in [facility] hospital lab) Nasopharyngeal Nasopharyngeal Swab     Status: None   Collection Time: 12/04/19  1:12 AM   Specimen: Nasopharyngeal Swab  Result Value Ref Range Status   SARS Coronavirus 2 NEGATIVE NEGATIVE Final    Comment: (NOTE) SARS-CoV-2 target nucleic acids are NOT DETECTED. The SARS-CoV-2 RNA is generally detectable in upper and lower respiratory specimens during the acute phase of infection. The lowest concentration of SARS-CoV-2 viral copies this assay can detect is 250 copies / mL. A negative result does not preclude SARS-CoV-2 infection and should not be used as the sole basis for treatment or other patient management decisions.  A negative result may occur with improper specimen collection / handling, submission of specimen other than nasopharyngeal swab, presence of viral mutation(s) within the areas targeted by this assay, and inadequate number of viral copies (<250 copies / mL). A negative result must be combined with clinical observations, patient history, and epidemiological information. Fact Sheet for Patients:   StrictlyIdeas.no Fact Sheet for Healthcare Providers: BankingDealers.co.za This test is not yet approved or cleared  by the [place] FDA and has been authorized for detection and/or diagnosis of SARS-CoV-2 by  FDA under an Emergency Use Authorization (EUA).  This EUA will remain in effect (meaning this test can be used) for the duration of the COVID-19 declaration under Section 564(b)(1) of the Act, 21 U.S.C. section 360bbb-3(b)(1), unless the authorization is terminated or revoked sooner. Performed at [facility], [address] [place], [place] [address]     Radiology Reports DG Chest 2 View  Result Date: 12/03/2019 CLINICAL DATA:  Shortness of breath EXAM: CHEST - 2 VIEW COMPARISON:   11/20/2018 FINDINGS: Cardiac shadows within normal limits. The lungs are well aerated with mild chronic interstitial changes stable from previous exams. No focal infiltrate or sizable effusion is seen. No bony abnormality is noted. IMPRESSION: Chronic interstitial changes without acute abnormality. Electronically Signed   By: Alcide Clever M.D.   On: 12/03/2019 18:38     Time Spent in minutes  30     Laverna Peace M.D on 12/05/2019 at 10:14 PM  To page go to [email or web address] - password [facility]

## 2019-12-05 NOTE — Progress Notes (Signed)
Initial Nutrition Assessment  DOCUMENTATION CODES:   Obesity unspecified  INTERVENTION:  Ensure Enlive po BID, each supplement provides 350 kcal and 20 grams of protein Prostat 30 ml po BID, each supplement provides 100 kcal and 15 grams of protein MVI with minerals daily  NUTRITION DIAGNOSIS:   Increased nutrient needs related to acute illness(acute respiratory failure with hypoxia) as evidenced by estimated needs.    GOAL:   Patient will meet greater than or equal to 90% of their needs    MONITOR:   PO intake, Supplement acceptance, I & O's, Labs, Weight trends  REASON FOR ASSESSMENT:   Consult Assessment of nutrition requirement/status  ASSESSMENT:  RD working remotely.  34 year old male with history of moderate persistent asthma, tobacco use, and prior drug abuse on methadone presents with a 2 day history of SOB without adequate relief from home inhalers, body aches, and sharp chest pain with coughing radiating from sternum to upper back. Patient placed on BiPAP in ED, Covid PCR negative and admitted for acute respiratory failure with hypoxia suspected related to viral bronchitis vs acute asthma exacerbation.  Patient currently on 12 L O2 via venturi mask sats 98%.  He is on regular diet, no documented intakes for review at this time. Will continue to monitor for po intake of meals and provide Ensure and Prostat to aid with meeting needs.  Current wt 259.38 lbs Weight history reviewed, stable over the past 8 months Medications reviewed and include: Methadone, Protonix, Prednisone Labs reviewed  NUTRITION - FOCUSED PHYSICAL EXAM: Unable to complete at this time, RD working remotely.    Diet Order:   Diet Order            Diet regular Room service appropriate? Yes; Fluid consistency: Thin  Diet effective now              EDUCATION NEEDS:   No education needs have been identified at this time  Skin:  Skin Assessment: Reviewed RN Assessment  Last BM:   5/13  Height:   Ht Readings from Last 1 Encounters:  12/03/19 5\' 11"  (1.803 m)    Weight:   Wt Readings from Last 1 Encounters:  12/03/19 117.9 kg    Ideal Body Weight:  78.2 kg  BMI:  Body mass index is 36.26 kg/m.  Estimated Nutritional Needs:   Kcal:  2350-2600  Protein:  120-130  Fluid:  >/= 2.3 L/day   12/05/19, RD, LDN Clinical Nutrition After Hours/Weekend Pager # in Amion

## 2019-12-06 DIAGNOSIS — J45901 Unspecified asthma with (acute) exacerbation: Secondary | ICD-10-CM

## 2019-12-06 LAB — CBC
HCT: 42.5 % (ref 39.0–52.0)
Hemoglobin: 14.2 g/dL (ref 13.0–17.0)
MCH: 29.3 pg (ref 26.0–34.0)
MCHC: 33.4 g/dL (ref 30.0–36.0)
MCV: 87.6 fL (ref 80.0–100.0)
Platelets: 277 10*3/uL (ref 150–400)
RBC: 4.85 MIL/uL (ref 4.22–5.81)
RDW: 12.8 % (ref 11.5–15.5)
WBC: 16.6 10*3/uL — ABNORMAL HIGH (ref 4.0–10.5)
nRBC: 0 % (ref 0.0–0.2)

## 2019-12-06 LAB — BASIC METABOLIC PANEL
Anion gap: 8 (ref 5–15)
BUN: 24 mg/dL — ABNORMAL HIGH (ref 6–20)
CO2: 28 mmol/L (ref 22–32)
Calcium: 9.1 mg/dL (ref 8.9–10.3)
Chloride: 104 mmol/L (ref 98–111)
Creatinine, Ser: 0.85 mg/dL (ref 0.61–1.24)
GFR calc Af Amer: >60 mL/min (ref >60)
GFR calc non Af Amer: >60 mL/min (ref >60)
Glucose, Bld: 137 mg/dL — ABNORMAL HIGH (ref 70–99)
Potassium: 4.1 mmol/L (ref 3.5–5.1)
Sodium: 140 mmol/L (ref 135–145)

## 2019-12-06 LAB — GLUCOSE, CAPILLARY: Glucose-Capillary: 123 mg/dL — ABNORMAL HIGH (ref 70–99)

## 2019-12-06 LAB — MAGNESIUM: Magnesium: 2.3 mg/dL (ref 1.7–2.4)

## 2019-12-06 LAB — PHOSPHORUS: Phosphorus: 2.7 mg/dL (ref 2.5–4.6)

## 2019-12-06 MED ORDER — POLYETHYLENE GLYCOL 3350 17 G PO PACK
17.0000 g | PACK | Freq: Two times a day (BID) | ORAL | Status: DC
  Administered 2019-12-06 – 2019-12-08 (×5): 17 g via ORAL
  Filled 2019-12-06 (×5): qty 1

## 2019-12-06 MED ORDER — DM-GUAIFENESIN ER 30-600 MG PO TB12
1.0000 | ORAL_TABLET | Freq: Two times a day (BID) | ORAL | Status: DC
  Administered 2019-12-06 – 2019-12-08 (×4): 1 via ORAL
  Filled 2019-12-06 (×4): qty 1

## 2019-12-06 MED ORDER — SENNOSIDES-DOCUSATE SODIUM 8.6-50 MG PO TABS
2.0000 | ORAL_TABLET | Freq: Two times a day (BID) | ORAL | Status: DC
  Administered 2019-12-06 – 2019-12-08 (×5): 2 via ORAL
  Filled 2019-12-06 (×5): qty 2

## 2019-12-06 MED ORDER — PREDNISONE 20 MG PO TABS
40.0000 mg | ORAL_TABLET | Freq: Every day | ORAL | Status: DC
  Administered 2019-12-06 – 2019-12-08 (×3): 40 mg via ORAL
  Filled 2019-12-06 (×3): qty 2

## 2019-12-06 MED ORDER — IPRATROPIUM-ALBUTEROL 0.5-2.5 (3) MG/3ML IN SOLN
3.0000 mL | Freq: Four times a day (QID) | RESPIRATORY_TRACT | Status: DC | PRN
  Administered 2019-12-06 – 2019-12-07 (×5): 3 mL via RESPIRATORY_TRACT
  Filled 2019-12-06 (×4): qty 3

## 2019-12-06 MED ORDER — BENZONATATE 100 MG PO CAPS
100.0000 mg | ORAL_CAPSULE | Freq: Three times a day (TID) | ORAL | Status: DC
  Administered 2019-12-06 – 2019-12-08 (×6): 100 mg via ORAL
  Filled 2019-12-06 (×8): qty 1

## 2019-12-06 NOTE — Progress Notes (Signed)
[facility]  PROGRESS NOTE  Marc Henderson NWG:[id number] DOB: 03/31/1986 DOA: 12/04/2019 PCP: Horald Pollen, MD Admit date - 12/04/2019   Admitting Physician Val Riles, MD  Outpatient Primary MD for the patient is Horald Pollen, MD  LOS - 2 Brief Narrative   Marc Henderson is a 34 y.o. year old male with medical history significant for moderate persistent asthma and tobacco abuse who presented on 12/04/2019 with 2-day history of shortness of breath with recent sick contacts including family, prior to present to ED underwent rapid Covid test at local pharmacy which was negative and was found to have SPO2 of 88% on room air with persistent hypoxia requiring high flow oxygen as well as transitioning to BiPAP. Blood work notable for WBC of 15, procalcitonin less than 0.10, Covid testing negative, chest x-ray showed chronic interstitial changes without any acute abnormalities. Patient's O2 status improved on BiPAP and was able to wean to nasal cannula home  Hospital course complicated by fairly high O2 requirements  Subjective  Today continues to report improvement in breathing.  Still has cough.  A & P    Acute hypoxic respiratory failure secondary to asthma exacerbation, suspect acute viral bronchitis (recent sick contacts, nonacute chest x-ray, Covid/flu testing negative) improving.  Still requiring 2 L nasal cannula to maintain SPO2 greater than 92%.  Still has diffuse wheezing in all lung fields but subjectively seems better -Monitor respiratory status on oral prednisone and as needed nebulizers -If remains clinically stable anticipate discharge in 24 hours -Supportive care with Mucinex, cough suppressant, continue flutter valve, incentive spirometry -Continue to trial wean O2, will need ambulatory O2 test in 24 hours -Encourage flutter valve, incentive spirometry -Encourage tobacco use cessation  Leukocytosis, likely due to steroids, improving. Suspect  demargination related to IV Solu-Medrol. Afebrile with no localizing signs or symptoms of infection, chest x-ray nonacute -Serial CBCs  Methadone dependence, stable Continue methadone 15 mg p.o. daily per pharmacy -Continue methadone as outpatient. Methadone clinic once able to discharge safely  Constipation in setting of opioid regimen. -Optimize bowel regimen: Scheduled senna docusate and MiraLAX   Family Communication  : No family at bedside  Code Status : Full  Disposition Plan  :  Patient is from home. Anticipated d/c date:  24 to 48 hours. Barriers to d/c or necessity for inpatient status:  Close monitoring respiratory status will transition to oral prednisone and as needed nebulizers, continue to wean O2, will need ambulatory O2 testing Consults  : None  Procedures  : None  DVT Prophylaxis  :  Lovenox   Lab Results  Component Value Date   PLT 277 12/06/2019    Diet :  Diet Order            Diet regular Room service appropriate? Yes; Fluid consistency: Thin  Diet effective now               Inpatient Medications Scheduled Meds: . benzonatate  100 mg Oral TID  . dextromethorphan-guaiFENesin  1 tablet Oral BID  . enoxaparin (LOVENOX) injection  40 mg Subcutaneous Q24H  . feeding supplement (ENSURE ENLIVE)  237 mL Oral BID BM  . feeding supplement (PRO-STAT SUGAR FREE 64)  30 mL Oral BID  . methadone  115 mg Oral Daily  . multivitamin with minerals  1 tablet Oral Daily  . pantoprazole  40 mg Oral Daily  . polyethylene glycol  17 g Oral BID  . predniSONE  40 mg Oral Q breakfast  .  senna-docusate  2 tablet Oral BID   Continuous Infusions: PRN Meds:.acetaminophen, albuterol, ipratropium-albuterol, menthol-cetylpyridinium, sodium chloride  Antibiotics  :   Anti-infectives (From admission, onward)   None       Objective   Vitals:   12/05/19 2148 12/06/19 0347 12/06/19 0727 12/06/19 0821  BP: 115/73 107/68  119/67  Pulse: 87 (!) 59  78  Resp: 18 20  18    Temp: 98.5 F (36.9 C) 97.7 F (36.5 C)  98.1 F (36.7 C)  TempSrc: Oral Oral    SpO2: 94% 93% 91% 96%  Weight:      Height:        SpO2: 96 % O2 Flow Rate (L/min): 3 L/min FiO2 (%): 50 %  Wt Readings from Last 3 Encounters:  12/03/19 117.9 kg  12/03/19 117.9 kg  08/27/19 120.2 kg     Intake/Output Summary (Last 24 hours) at 12/06/2019 1334 Last data filed at 12/06/2019 1007 Gross per 24 hour  Intake 120 ml  Output --  Net 120 ml    Physical Exam:     Awake Alert, Oriented X 3, Normal affect No new F.N deficits,  [place].AT, Normal respiratory effort on two liters, diffuse wheezing in all lung fields, diminished breath sounds at bases, no conversational dyspnea  RRR,No Gallops,Rubs or new Murmurs,  +ve B.Sounds, Abd Soft, No tenderness, No rebound, guarding or rigidity. No Cyanosis, No new Rash or bruise     I have personally reviewed the following:   Data Reviewed:  CBC Recent Labs  Lab 12/03/19 1745 12/05/19 0411 12/06/19 0408  WBC 14.8* 22.9* 16.6*  HGB 15.7 14.8 14.2  HCT 43.9 42.4 42.5  PLT 272 283 277  MCV 81.4 84.3 87.6  MCH 29.1 29.4 29.3  MCHC 35.8 34.9 33.4  RDW 12.5 13.0 12.8    Chemistries  Recent Labs  Lab 12/03/19 1745 12/05/19 0411 12/06/19 0408  NA 137 137 140  K 4.1 5.1 4.1  CL 103 104 104  CO2 24 26 28   GLUCOSE 128* 134* 137*  BUN 10 22* 24*  CREATININE 0.90 0.98 0.85  CALCIUM 9.2 9.7 9.1  MG  --  2.3 2.3   ------------------------------------------------------------------------------------------------------------------ No results for input(s): CHOL, HDL, LDLCALC, TRIG, CHOLHDL, LDLDIRECT in the last 72 hours.  No results found for: HGBA1C ------------------------------------------------------------------------------------------------------------------ No results for input(s): TSH, T4TOTAL, T3FREE, THYROIDAB in the last 72 hours.  Invalid input(s):  FREET3 ------------------------------------------------------------------------------------------------------------------ No results for input(s): VITAMINB12, FOLATE, FERRITIN, TIBC, IRON, RETICCTPCT in the last 72 hours.  Coagulation profile No results for input(s): INR, PROTIME in the last 168 hours.  No results for input(s): DDIMER in the last 72 hours.  Cardiac Enzymes No results for input(s): CKMB, TROPONINI, MYOGLOBIN in the last 168 hours.  Invalid input(s): CK ------------------------------------------------------------------------------------------------------------------    Component Value Date/Time   BNP 15.0 11/17/2018 2034    Micro Results Recent Results (from the past 240 hour(s))  SARS Coronavirus 2 by RT PCR (hospital order, performed in [facility] hospital lab) Nasopharyngeal Nasopharyngeal Swab     Status: None   Collection Time: 12/04/19  1:12 AM   Specimen: Nasopharyngeal Swab  Result Value Ref Range Status   SARS Coronavirus 2 NEGATIVE NEGATIVE Final    Comment: (NOTE) SARS-CoV-2 target nucleic acids are NOT DETECTED. The SARS-CoV-2 RNA is generally detectable in upper and lower respiratory specimens during the acute phase of infection. The lowest concentration of SARS-CoV-2 viral copies this assay can detect is 250 copies / mL. A negative result  does not preclude SARS-CoV-2 infection and should not be used as the sole basis for treatment or other patient management decisions.  A negative result may occur with improper specimen collection / handling, submission of specimen other than nasopharyngeal swab, presence of viral mutation(s) within the areas targeted by this assay, and inadequate number of viral copies (<250 copies / mL). A negative result must be combined with clinical observations, patient history, and epidemiological information. Fact Sheet for Patients:   BoilerBrush.com.cy Fact Sheet for Healthcare  Providers: https://pope.com/ This test is not yet approved or cleared  by the [place] FDA and has been authorized for detection and/or diagnosis of SARS-CoV-2 by FDA under an Emergency Use Authorization (EUA).  This EUA will remain in effect (meaning this test can be used) for the duration of the COVID-19 declaration under Section 564(b)(1) of the Act, 21 U.S.C. section 360bbb-3(b)(1), unless the authorization is terminated or revoked sooner. Performed at [facility], [address], [place], [place] [address]     Radiology Reports DG Chest 2 View  Result Date: 12/03/2019 CLINICAL DATA:  Shortness of breath EXAM: CHEST - 2 VIEW COMPARISON:  11/20/2018 FINDINGS: Cardiac shadows within normal limits. The lungs are well aerated with mild chronic interstitial changes stable from previous exams. No focal infiltrate or sizable effusion is seen. No bony abnormality is noted. IMPRESSION: Chronic interstitial changes without acute abnormality. Electronically Signed   By: Alcide Clever M.D.   On: 12/03/2019 18:38     Time Spent in minutes  30     Laverna Peace M.D on 12/06/2019 at 1:34 PM  To page go to [email or web address] - password [facility]

## 2019-12-07 DIAGNOSIS — J988 Other specified respiratory disorders: Secondary | ICD-10-CM

## 2019-12-07 DIAGNOSIS — B9789 Other viral agents as the cause of diseases classified elsewhere: Secondary | ICD-10-CM

## 2019-12-07 LAB — CBC WITH DIFFERENTIAL/PLATELET
Abs Immature Granulocytes: 0.18 10*3/uL — ABNORMAL HIGH (ref 0.00–0.07)
Basophils Absolute: 0 10*3/uL (ref 0.0–0.1)
Basophils Relative: 0 %
Eosinophils Absolute: 0 10*3/uL (ref 0.0–0.5)
Eosinophils Relative: 0 %
HCT: 46 % (ref 39.0–52.0)
Hemoglobin: 15.4 g/dL (ref 13.0–17.0)
Immature Granulocytes: 2 %
Lymphocytes Relative: 12 %
Lymphs Abs: 1.3 10*3/uL (ref 0.7–4.0)
MCH: 29.3 pg (ref 26.0–34.0)
MCHC: 33.5 g/dL (ref 30.0–36.0)
MCV: 87.6 fL (ref 80.0–100.0)
Monocytes Absolute: 0.5 10*3/uL (ref 0.1–1.0)
Monocytes Relative: 5 %
Neutro Abs: 8.8 10*3/uL — ABNORMAL HIGH (ref 1.7–7.7)
Neutrophils Relative %: 81 %
Platelets: 305 10*3/uL (ref 150–400)
RBC: 5.25 MIL/uL (ref 4.22–5.81)
RDW: 12.5 % (ref 11.5–15.5)
WBC: 10.7 10*3/uL — ABNORMAL HIGH (ref 4.0–10.5)
nRBC: 0 % (ref 0.0–0.2)

## 2019-12-07 LAB — CBC
HCT: 44.2 % (ref 39.0–52.0)
Hemoglobin: 14.7 g/dL (ref 13.0–17.0)
MCH: 29.3 pg (ref 26.0–34.0)
MCHC: 33.3 g/dL (ref 30.0–36.0)
MCV: 88 fL (ref 80.0–100.0)
Platelets: 279 10*3/uL (ref 150–400)
RBC: 5.02 MIL/uL (ref 4.22–5.81)
RDW: 12.7 % (ref 11.5–15.5)
WBC: 12.8 10*3/uL — ABNORMAL HIGH (ref 4.0–10.5)
nRBC: 0 % (ref 0.0–0.2)

## 2019-12-07 LAB — BASIC METABOLIC PANEL
Anion gap: 11 (ref 5–15)
BUN: 25 mg/dL — ABNORMAL HIGH (ref 6–20)
CO2: 30 mmol/L (ref 22–32)
Calcium: 9.1 mg/dL (ref 8.9–10.3)
Chloride: 102 mmol/L (ref 98–111)
Creatinine, Ser: 0.92 mg/dL (ref 0.61–1.24)
GFR calc Af Amer: >60 mL/min (ref >60)
GFR calc non Af Amer: >60 mL/min (ref >60)
Glucose, Bld: 99 mg/dL (ref 70–99)
Potassium: 4.9 mmol/L (ref 3.5–5.1)
Sodium: 143 mmol/L (ref 135–145)

## 2019-12-07 LAB — PHOSPHORUS: Phosphorus: 3.9 mg/dL (ref 2.5–4.6)

## 2019-12-07 LAB — MAGNESIUM: Magnesium: 2.5 mg/dL — ABNORMAL HIGH (ref 1.7–2.4)

## 2019-12-07 MED ORDER — MOMETASONE FURO-FORMOTEROL FUM 100-5 MCG/ACT IN AERO
2.0000 | INHALATION_SPRAY | Freq: Two times a day (BID) | RESPIRATORY_TRACT | Status: DC
  Administered 2019-12-07 – 2019-12-08 (×2): 2 via RESPIRATORY_TRACT
  Filled 2019-12-07: qty 8.8

## 2019-12-07 NOTE — Progress Notes (Signed)
Patient walked around nursing station x4. Oxygen remained between 92-96% on room air.

## 2019-12-07 NOTE — Consult Note (Signed)
Date: 12/07/2019,   MRN# [id number] Marc Henderson 08/18/1985 Code Status:     Code Status Orders  (From admission, onward)         Start     Ordered   12/04/19 0239  Full code  Continuous     12/04/19 0241        Code Status History    Date Active Date Inactive Code Status Order ID Comments User Context   10/02/2017 1708 10/05/2017 2133 Full Code [id number]  Shirley, [place], DO Inpatient   02/15/2014 0848 02/15/2014 1216 Full Code [id number]  [facility]   Advance Care Planning Activity     Hosp day:@LENGTHOFSTAYDAYS @ Referring MD: @ATDPROV @     PCP:      AdmissionWeight: 117.9 kg                 CurrentWeight: 117.9 kg Marc Henderson is a 34 y.o. old male seen in consultation for pulmonary medicine at the request of Dr. 34.   CC: hypoxia and sob  HPI: This is a 34 year old male, who came in c/o scratchy throat, coughing as well as his family. Subsequently became more sob, chest tigHt, came to the ER. Sats in the upper 80"s, clear cxr, wheezing and known to have asthma. Lost to follow up from 34. He is on methadone. No strong hx of aspiration. No leg pain, swelling, syncope or recent long distance travel. Rapid covid test was negative. Today was able to get oxygen ra sats 98% at rest. He is still wheezing. Meds reviewed. He denies gerd, post nasal drainage. No obvious hx of noxious inhalants. No mold in the house. Not sleeping on feathered pillows.   PMHX:   Past Medical History:  Diagnosis Date  . Asthma    Surgical Hx:  History reviewed. No pertinent surgical history. Family Hx:  History reviewed. No pertinent family history. Social Hx:   Social History   Tobacco Use  . Smoking status: Current Every Day Smoker    Packs/day: 1.00    Years: 8.00    Pack years: 8.00  . Smokeless tobacco: Never Used  Substance Use Topics  . Alcohol use: Yes    Comment: 1-2 beer/daily  . Drug use: No   Medication:    Home Medication:    Current  Medication: @CURMEDTAB @   Allergies:  Patient has no known allergies.  Review of Systems: Gen:  Denies  fever, sweats, chills HEENT: Denies blurred vision, double vision, ear pain, eye pain, hearing loss, nose bleeds, sore throat Cvc:  No dizziness, chest pain or heaviness Resp: less wheezing and shortness of breath, minimum cough, no hemoptysis. No plarisy   Gi: Denies swallowing difficulty, stomach pain, nausea or vomiting, diarrhea, constipation, bowel incontinence Gu:  Denies bladder incontinence, burning urine Ext:   No Joint pain, stiffness or swelling Skin: No skin rash, easy bruising or bleeding or hives Endoc:  No polyuria, polydipsia , polyphagia or weight change Psych: No depression, insomnia or hallucinations  Other:  All other systems negative  Physical Examination:   VS: BP 120/82 (BP Location: Left Arm)   Pulse (!) 59   Temp 97.6 F (36.4 C) (Oral)   Resp 16   Ht 5\' 11"  (1.803 m)   Wt 117.9 kg   SpO2 92%   BMI 36.26 kg/m   General Appearance: No distress  Neuro: without focal findings, mental status, speech normal, alert and oriented, cranial nerves 2-12 intact, reflexes normal and symmetric, sensation grossly  normal  HEENT: PERRLA, EOM intact, no ptosis, no other lesions noticed, Mallampati: Pulmonary:.+ wheezing, + RHONCHI, No rales  No rub:   Cardiovascular:  Normal S1,S2.  No m/r/g.  Abdominal aorta pulsation normal.    Abdomen:Benign, Soft, non-tender, No masses, hepatosplenomegaly, No lymphadenopathy Endoc: No evident thyromegaly, no signs of acromegaly or Cushing features Skin:   warm, no rashes, no ecchymosis  Extremities: normal, no cyanosis, clubbing, no edema, warm with normal capillary refill. Psych: alert, reasonable insight     Labs results:   Recent Labs    12/05/19 0411 12/06/19 0408 12/07/19 0532  HGB 14.8 14.2 14.7  HCT 42.4 42.5 44.2  MCV 84.3 87.6 88.0  WBC 22.9* 16.6* 12.8*  BUN 22* 24* 25*  CREATININE 0.98 0.85 0.92  GLUCOSE  134* 137* 99  CALCIUM 9.7 9.1 9.1  ,     CLINICAL DATA:  Shortness of breath  EXAM: CHEST - 2 VIEW  COMPARISON:  11/20/2018  FINDINGS: Cardiac shadows within normal limits. The lungs are well aerated with mild chronic interstitial changes stable from previous exams. No focal infiltrate or sizable effusion is seen. No bony abnormality is noted.  IMPRESSION: Chronic interstitial changes without acute abnormality.   Electronically Signed   By: Inez Catalina M.D.   On: 12/03/2019 18:38 Assessment and Plan: This is a young male, presented with what sounds like a viral triggered bronchospasm, in a patient who has asthma and not on full treatment due to insurance unwilling to pay for his meds. He should be on an ics and possible singulair 10 mg q day. He is doing well off oxygen still has significant bronchospasm. cxr looks quite clear. Pulm arteries upper limits of normal -ck IGE, eosinophil count -continue prednisone with slow taper ( 40 x 2  days and come down in increment of 5 mg and d/c) -ventolin 2 puffs q 4-6 hors prn -add dulera two puffs bid -per response will add singulair 10 mg q day -f/u in pulmonary in one week ( next Tuesday)  -Will revisit in the am  [place]   I have personally obtained a history, examined the patient, evaluated laboratory and imaging results, formulated the assessment and plan and placed orders.  The Patient requires high complexity decision making for assessment and support, frequent evaluation and titration of therapies, application of advanced monitoring technologies and extensive interpretation of multiple databases.   Chelsey Redondo,M.D. Pulmonary & Critical care Medicine [facility]

## 2019-12-07 NOTE — Progress Notes (Signed)
[facility]  PROGRESS NOTE  Marc Henderson NFA:[id number] DOB: Jun 06, 1986 DOA: 12/04/2019 PCP: Horald Pollen, MD Admit date - 12/04/2019   Admitting Physician Val Riles, MD  Outpatient Primary MD for the patient is Horald Pollen, MD  LOS - 3 Brief Narrative   Marc Henderson is a 34 y.o. year old male with medical history significant for moderate persistent asthma and tobacco abuse who presented on 12/04/2019 with 2-day history of shortness of breath with recent sick contacts including family, prior to present to ED underwent rapid Covid test at local pharmacy which was negative and was found to have SPO2 of 88% on room air with persistent hypoxia requiring high flow oxygen as well as transitioning to BiPAP. Blood work notable for WBC of 15, procalcitonin less than 0.10, Covid testing negative, chest x-ray showed chronic interstitial changes without any acute abnormalities. Patient's O2 status improved on BiPAP and was able to wean to nasal cannula home  Hospital course complicated by fairly high O2 requirements  Subjective  Today continues to report improvement in breathing.  Still has cough.  A & P    Acute hypoxic respiratory failure secondary to asthma exacerbation, suspect acute viral bronchitis (recent sick contacts, nonacute chest x-ray, Covid/flu testing negative) improving.  Able to wean off from 2 L to room air and maintained normal oxygen saturation with ambulation prior to discharge.  No increased work of breathing.  Still has diffuse wheezing in all lung fields but subjectively seems much better -Monitor respiratory status on oral prednisone and as needed nebulizers -Supportive care with Mucinex, cough suppressant, continue flutter valve, incentive spirometry -Needs long-acting agent for asthma -Encourage flutter valve, incentive spirometry -Encourage tobacco use cessation  Moderate persistent asthma.  Though no longer requiring O2 still has  significant wheezing, admitting chest x-ray consistent with chronic interstitial lung changes without signs of vascular congestion or edema that would suggest CHF (last echo on 11/2018 showed preserved EF) only on albuterol at home with no controller agent.  Has been on Spiriva in the past, but limited by financial constraints related to insurance.  Last evaluated by pulmonology on 11/2018 was found to have elevated eosinophils, patient has not followed-up since then -Continue as needed albuterol -We will need longer acting agent for control, given eosinophilia, will consult pulmonology for assistance--- adding Dulera, follow-up IgE/eosinophil count  Leukocytosis, likely due to steroids, improving. Suspect demargination related to IV Solu-Medrol. Afebrile with no localizing signs or symptoms of infection, chest x-ray nonacute -Serial CBCs  Methadone dependence, stable Continue methadone 15 mg p.o. daily per pharmacy -Continue methadone as outpatient. Methadone clinic once able to discharge safely  Constipation in setting of opioid regimen. -Optimize bowel regimen: Scheduled senna docusate and MiraLAX   Family Communication  : No family at bedside  Code Status : Full  Disposition Plan  :  Patient is from home. Anticipated d/c date:  24 to 48 hours. Barriers to d/c or necessity for inpatient status:  Close monitoring respiratory status, Consults  : Pulmonology  Procedures  : None  DVT Prophylaxis  :  Lovenox   Lab Results  Component Value Date   PLT 279 12/07/2019    Diet :  Diet Order            Diet regular Room service appropriate? Yes; Fluid consistency: Thin  Diet effective now               Inpatient Medications Scheduled Meds: . benzonatate  100 mg Oral  TID  . dextromethorphan-guaiFENesin  1 tablet Oral BID  . enoxaparin (LOVENOX) injection  40 mg Subcutaneous Q24H  . feeding supplement (ENSURE ENLIVE)  237 mL Oral BID BM  . feeding supplement (PRO-STAT SUGAR FREE  64)  30 mL Oral BID  . methadone  115 mg Oral Daily  . multivitamin with minerals  1 tablet Oral Daily  . pantoprazole  40 mg Oral Daily  . polyethylene glycol  17 g Oral BID  . predniSONE  40 mg Oral Q breakfast  . senna-docusate  2 tablet Oral BID   Continuous Infusions: PRN Meds:.acetaminophen, albuterol, ipratropium-albuterol, menthol-cetylpyridinium, sodium chloride  Antibiotics  :   Anti-infectives (From admission, onward)   None       Objective   Vitals:   12/06/19 2105 12/07/19 0033 12/07/19 0810 12/07/19 0819  BP:  115/80  120/82  Pulse:  61  (!) 59  Resp:  16    Temp:  98.4 F (36.9 C)  97.6 F (36.4 C)  TempSrc:  Oral  Oral  SpO2: 94% 95% 94% 98%  Weight:      Height:        SpO2: 98 % O2 Flow Rate (L/min): 1 L/min FiO2 (%): 50 %  Wt Readings from Last 3 Encounters:  12/03/19 117.9 kg  12/03/19 117.9 kg  08/27/19 120.2 kg     Intake/Output Summary (Last 24 hours) at 12/07/2019 1217 Last data filed at 12/07/2019 1001 Gross per 24 hour  Intake 840 ml  Output --  Net 840 ml    Physical Exam:     Awake Alert, Oriented X 3, Normal affect No new F.N deficits,  [place].AT, Normal respiratory effort on room air, diffuse wheezing in all lung fields, diminished breath sounds at bases, no conversational dyspnea  RRR,No Gallops,Rubs or new Murmurs,  +ve B.Sounds, Abd Soft, No tenderness, No rebound, guarding or rigidity. No Cyanosis, No new Rash or bruise     I have personally reviewed the following:   Data Reviewed:  CBC Recent Labs  Lab 12/03/19 1745 12/05/19 0411 12/06/19 0408 12/07/19 0532  WBC 14.8* 22.9* 16.6* 12.8*  HGB 15.7 14.8 14.2 14.7  HCT 43.9 42.4 42.5 44.2  PLT 272 283 277 279  MCV 81.4 84.3 87.6 88.0  MCH 29.1 29.4 29.3 29.3  MCHC 35.8 34.9 33.4 33.3  RDW 12.5 13.0 12.8 12.7    Chemistries  Recent Labs  Lab 12/03/19 1745 12/05/19 0411 12/06/19 0408 12/07/19 0532  NA 137 137 140 143  K 4.1 5.1 4.1 4.9  CL 103 104  104 102  CO2 24 26 28 30   GLUCOSE 128* 134* 137* 99  BUN 10 22* 24* 25*  CREATININE 0.90 0.98 0.85 0.92  CALCIUM 9.2 9.7 9.1 9.1  MG  --  2.3 2.3 2.5*   ------------------------------------------------------------------------------------------------------------------ No results for input(s): CHOL, HDL, LDLCALC, TRIG, CHOLHDL, LDLDIRECT in the last 72 hours.  No results found for: HGBA1C ------------------------------------------------------------------------------------------------------------------ No results for input(s): TSH, T4TOTAL, T3FREE, THYROIDAB in the last 72 hours.  Invalid input(s): FREET3 ------------------------------------------------------------------------------------------------------------------ No results for input(s): VITAMINB12, FOLATE, FERRITIN, TIBC, IRON, RETICCTPCT in the last 72 hours.  Coagulation profile No results for input(s): INR, PROTIME in the last 168 hours.  No results for input(s): DDIMER in the last 72 hours.  Cardiac Enzymes No results for input(s): CKMB, TROPONINI, MYOGLOBIN in the last 168 hours.  Invalid input(s): CK ------------------------------------------------------------------------------------------------------------------    Component Value Date/Time   BNP 15.0 11/17/2018 2034    Micro  Results Recent Results (from the past 240 hour(s))  SARS Coronavirus 2 by RT PCR (hospital order, performed in [facility] hospital lab) Nasopharyngeal Nasopharyngeal Swab     Status: None   Collection Time: 12/04/19  1:12 AM   Specimen: Nasopharyngeal Swab  Result Value Ref Range Status   SARS Coronavirus 2 NEGATIVE NEGATIVE Final    Comment: (NOTE) SARS-CoV-2 target nucleic acids are NOT DETECTED. The SARS-CoV-2 RNA is generally detectable in upper and lower respiratory specimens during the acute phase of infection. The lowest concentration of SARS-CoV-2 viral copies this assay can detect is 250 copies / mL. A negative result does not  preclude SARS-CoV-2 infection and should not be used as the sole basis for treatment or other patient management decisions.  A negative result may occur with improper specimen collection / handling, submission of specimen other than nasopharyngeal swab, presence of viral mutation(s) within the areas targeted by this assay, and inadequate number of viral copies (<250 copies / mL). A negative result must be combined with clinical observations, patient history, and epidemiological information. Fact Sheet for Patients:   BoilerBrush.com.cy Fact Sheet for Healthcare Providers: https://pope.com/ This test is not yet approved or cleared  by the [place] FDA and has been authorized for detection and/or diagnosis of SARS-CoV-2 by FDA under an Emergency Use Authorization (EUA).  This EUA will remain in effect (meaning this test can be used) for the duration of the COVID-19 declaration under Section 564(b)(1) of the Act, 21 U.S.C. section 360bbb-3(b)(1), unless the authorization is terminated or revoked sooner. Performed at [facility], [address], [place], [place] [address]     Radiology Reports DG Chest 2 View  Result Date: 12/03/2019 CLINICAL DATA:  Shortness of breath EXAM: CHEST - 2 VIEW COMPARISON:  11/20/2018 FINDINGS: Cardiac shadows within normal limits. The lungs are well aerated with mild chronic interstitial changes stable from previous exams. No focal infiltrate or sizable effusion is seen. No bony abnormality is noted. IMPRESSION: Chronic interstitial changes without acute abnormality. Electronically Signed   By: Alcide Clever M.D.   On: 12/03/2019 18:38     Time Spent in minutes  30     Laverna Peace M.D on 12/07/2019 at 12:17 PM  To page go to [email or web address] - password [facility]

## 2019-12-08 LAB — CBC
HCT: 43.8 % (ref 39.0–52.0)
Hemoglobin: 15.2 g/dL (ref 13.0–17.0)
MCH: 29.5 pg (ref 26.0–34.0)
MCHC: 34.7 g/dL (ref 30.0–36.0)
MCV: 85 fL (ref 80.0–100.0)
Platelets: 268 10*3/uL (ref 150–400)
RBC: 5.15 MIL/uL (ref 4.22–5.81)
RDW: 12.6 % (ref 11.5–15.5)
WBC: 10.4 10*3/uL (ref 4.0–10.5)
nRBC: 0 % (ref 0.0–0.2)

## 2019-12-08 LAB — BASIC METABOLIC PANEL
Anion gap: 7 (ref 5–15)
BUN: 23 mg/dL — ABNORMAL HIGH (ref 6–20)
CO2: 33 mmol/L — ABNORMAL HIGH (ref 22–32)
Calcium: 9.2 mg/dL (ref 8.9–10.3)
Chloride: 101 mmol/L (ref 98–111)
Creatinine, Ser: 0.98 mg/dL (ref 0.61–1.24)
GFR calc Af Amer: >60 mL/min (ref >60)
GFR calc non Af Amer: >60 mL/min (ref >60)
Glucose, Bld: 97 mg/dL (ref 70–99)
Potassium: 4.4 mmol/L (ref 3.5–5.1)
Sodium: 141 mmol/L (ref 135–145)

## 2019-12-08 MED ORDER — MOMETASONE FURO-FORMOTEROL FUM 100-5 MCG/ACT IN AERO: 2.0000 | INHALATION_SPRAY | Freq: Two times a day (BID) | RESPIRATORY_TRACT | 0 refills | Status: DC

## 2019-12-08 MED ORDER — PREDNISONE 5 MG PO TABS
ORAL_TABLET | ORAL | 0 refills | Status: DC
Start: 2019-12-08 — End: 2020-07-06

## 2019-12-08 MED ORDER — DM-GUAIFENESIN ER 30-600 MG PO TB12: 1.0000 | ORAL_TABLET | Freq: Two times a day (BID) | ORAL | 0 refills | Status: DC | PRN

## 2019-12-08 MED ORDER — BENZONATATE 100 MG PO CAPS: 100.0000 mg | ORAL_CAPSULE | Freq: Three times a day (TID) | ORAL | 0 refills | Status: DC | PRN

## 2019-12-08 NOTE — Discharge Summary (Signed)
LUISMIGUEL Henderson YQI:[id number] DOB: 1986-03-16 DOA: 12/04/2019  PCP: Georgina Quint, MD  Admit date: 12/04/2019 Discharge date: 12/08/2019  Admitted From: Home Disposition: Home  Recommendations for Outpatient Follow-up:  1. Follow up with PCP in 1-2 weeks 2. Follow-up with pulmonology arranged 3. Discharge albuterol as needed, Dulera, prolonged prednisone taper 4.   Home Health:None Equipment/Devices: None Discharge Condition: Stable CODE STATUS: Full   Brief/Interim Summary: History of present illness:  Marc Henderson is a 34 y.o. year old male with medical history significant for moderate persistent asthma and tobacco abuse who presented on 12/04/2019 with 2-day history of shortness of breath with recent sick contacts including family, prior to present to ED underwent rapid Covid test at local pharmacy which was negative and was found to have SPO2 of 88% on room air with persistent hypoxia requiring high flow oxygen as well as transitioning to BiPAP. Blood work notable for WBC of 15, procalcitonin less than 0.10, Covid testing negative, chest x-ray showed chronic interstitial changes without any acute abnormalities. Patient's O2 status improved on BiPAP and was able to wean to nasal cannula home  Hospital course complicated by fairly high O2 requirements.  Remaining hospital course addressed in problem based format below:   Hospital Course:   Acute hypoxic respiratory failure secondary to asthma exacerbation, suspect acute viral bronchitis (recent sick contacts, nonacute chest x-ray, Covid/flu testing negative) resolved.  Able to wean off from 2 L to room air and maintained normal oxygen saturation with ambulation prior to discharge.  No increased work of breathing.  Bronchospasm significantly improved with addition of Dulera to as needed albuterol as recommended by pulmonology -Patient will have follow-up with pulmonology as outpatient -Continue as needed albuterol,  Dulera, prednisone taper on discharge -Supportive care with Mucinex, cough suppressant, continue flutter valve, incentive spirometry -Encourage tobacco use cessation  Moderate persistent asthma.    Prior to admission only on albuterol at home with no controller agent.  Has been on Spiriva in the past, but limited by financial constraints related to insurance.  Last evaluated by pulmonology on 11/2018 was found to have elevated eosinophils, patient has not followed-up since then -Continue as needed albuterol -Based on pulmonology recommendations will discharge on Dulera, prolonged prednisone taper -Follow-up with pulmonology as outpatient, follow-up IgE/eosinophil count  Leukocytosis, likely due to steroids, improving. Suspect demargination related to IV Solu-Medrol. Afebrile with no localizing signs or symptoms of infection, chest x-ray nonacute -WBC 10.7 on discharge  Methadone dependence, stable Continue methadone 15 mg p.o. daily per pharmacy -Continue methadone as outpatient in methadone clinic   Constipation in setting of opioid regimen. -Continue bowel regimen on discharge   Consultations:  Pulmonology ( Dr. Meredeth Ide)  Procedures/Studies: None Subjective: Feels very well today. Mild cough. No shortness of breath or wheezing.  Discharge Exam: Vitals:   12/07/19 2244 12/08/19 0758  BP: 138/85   Pulse: 76   Resp: 16   Temp: 98.2 F (36.8 C)   SpO2: 95% 94%   Vitals:   12/07/19 1658 12/07/19 1955 12/07/19 2244 12/08/19 0758  BP: 129/88  138/85   Pulse: 75  76   Resp: 16  16   Temp: 98.4 F (36.9 C)  98.2 F (36.8 C)   TempSrc: Oral  Oral   SpO2: 96% 93% 95% 94%  Weight:      Height:        General: Lying in bed, no apparent distress Eyes: EOMI, anicteric ENT: Oral Mucosa clear and moist Cardiovascular: regular rate  and rhythm, no murmurs, rubs or gallops, no edema, Respiratory: Normal respiratory effort on room air, occasional rhonchi, scant  wheezing Abdomen: soft, non-distended, non-tender, normal bowel sounds Skin: No Rash Neurologic: Grossly no focal neuro deficit.Mental status AAOx3, speech normal, Psychiatric:Appropriate affect, and mood  Discharge Diagnoses:  Principal Problem:   Acute respiratory failure with hypoxia (HCC) Active Problems:   Asthma exacerbation, non-allergic, moderate persistent   Acute viral bronchitis   Methadone dependence (HCC)   Respiratory failure with hypoxia [facility])    Discharge Instructions  Discharge Instructions    Diet - low sodium heart healthy   Complete by: As directed    Increase activity slowly   Complete by: As directed      Allergies as of 12/08/2019   No Known Allergies     Medication List    TAKE these medications   albuterol 108 (90 Base) MCG/ACT inhaler Commonly known as: ProAir HFA Inhale 2 puffs into the lungs every 6 (six) hours as needed for wheezing or shortness of breath.   albuterol (2.5 MG/3ML) 0.083% nebulizer solution Commonly known as: PROVENTIL Take 3 mLs (2.5 mg total) by nebulization every 6 (six) hours as needed for wheezing or shortness of breath.   benzonatate 100 MG capsule Commonly known as: TESSALON Take 1 capsule (100 mg total) by mouth 3 (three) times daily as needed for cough.   dextromethorphan-guaiFENesin 30-600 MG 12hr tablet Commonly known as: MUCINEX DM Take 1 tablet by mouth 2 (two) times daily as needed for cough.   methadone 10 MG/5ML solution Commonly known as: DOLOPHINE Take 115 mg by mouth every morning.   mometasone-formoterol 100-5 MCG/ACT Aero Commonly known as: DULERA Inhale 2 puffs into the lungs 2 (two) times daily.   predniSONE 5 MG tablet Commonly known as: DELTASONE Take 40 mg ( 8 tabs) for 2 days,then 35 mg (7 tabs) for 2 days, then 30 mg ( 6 tabs) for 2 days, then 25 mg ( 5 tabs) for 2 days, then 20 mg ( 4 tabs) for 2 days, then 15mg  ( 3 tabs)for 2 days, then 10 mg (2 tabs) for 2 days, then 5 mg (1 tab) for  two days       No Known Allergies      The results of significant diagnostics from this hospitalization (including imaging, microbiology, ancillary and laboratory) are listed below for reference.     Microbiology: Recent Results (from the past 240 hour(s))  SARS Coronavirus 2 by RT PCR (hospital order, performed in [facility] hospital lab) Nasopharyngeal Nasopharyngeal Swab     Status: None   Collection Time: 12/04/19  1:12 AM   Specimen: Nasopharyngeal Swab  Result Value Ref Range Status   SARS Coronavirus 2 NEGATIVE NEGATIVE Final    Comment: (NOTE) SARS-CoV-2 target nucleic acids are NOT DETECTED. The SARS-CoV-2 RNA is generally detectable in upper and lower respiratory specimens during the acute phase of infection. The lowest concentration of SARS-CoV-2 viral copies this assay can detect is 250 copies / mL. A negative result does not preclude SARS-CoV-2 infection and should not be used as the sole basis for treatment or other patient management decisions.  A negative result may occur with improper specimen collection / handling, submission of specimen other than nasopharyngeal swab, presence of viral mutation(s) within the areas targeted by this assay, and inadequate number of viral copies (<250 copies / mL). A negative result must be combined with clinical observations, patient history, and epidemiological information. Fact Sheet for Patients:  BoilerBrush.com.cy Fact Sheet for Healthcare Providers: https://pope.com/ This test is not yet approved or cleared  by the [place] FDA and has been authorized for detection and/or diagnosis of SARS-CoV-2 by FDA under an Emergency Use Authorization (EUA).  This EUA will remain in effect (meaning this test can be used) for the duration of the COVID-19 declaration under Section 564(b)(1) of the Act, 21 U.S.C. section 360bbb-3(b)(1), unless the authorization is terminated  or revoked sooner. Performed at [facility], [address] [place], [place] [address]      Labs: BNP (last 3 results) No results for input(s): BNP in the last 8760 hours. Basic Metabolic Panel: Recent Labs  Lab 12/03/19 1745 12/05/19 0411 12/06/19 0408 12/07/19 0532 12/08/19 0524  NA 137 137 140 143 141  K 4.1 5.1 4.1 4.9 4.4  CL 103 104 104 102 101  CO2 24 26 28 30  33*  GLUCOSE 128* 134* 137* 99 97  BUN 10 22* 24* 25* 23*  CREATININE 0.90 0.98 0.85 0.92 0.98  CALCIUM 9.2 9.7 9.1 9.1 9.2  MG  --  2.3 2.3 2.5*  --   PHOS  --  2.7 2.7 3.9  --    Liver Function Tests: No results for input(s): AST, ALT, ALKPHOS, BILITOT, PROT, ALBUMIN in the last 168 hours. No results for input(s): LIPASE, AMYLASE in the last 168 hours. No results for input(s): AMMONIA in the last 168 hours. CBC: Recent Labs  Lab 12/05/19 0411 12/06/19 0408 12/07/19 0532 12/07/19 1806 12/08/19 0524  WBC 22.9* 16.6* 12.8* 10.7* 10.4  NEUTROABS  --   --   --  8.8*  --   HGB 14.8 14.2 14.7 15.4 15.2  HCT 42.4 42.5 44.2 46.0 43.8  MCV 84.3 87.6 88.0 87.6 85.0  PLT 283 277 279 305 268   Cardiac Enzymes: No results for input(s): CKTOTAL, CKMB, CKMBINDEX, TROPONINI in the last 168 hours. BNP: Invalid input(s): POCBNP CBG: Recent Labs  Lab 12/06/19 0351  GLUCAP 123*   D-Dimer No results for input(s): DDIMER in the last 72 hours. Hgb A1c No results for input(s): HGBA1C in the last 72 hours. Lipid Profile No results for input(s): CHOL, HDL, LDLCALC, TRIG, CHOLHDL, LDLDIRECT in the last 72 hours. Thyroid function studies No results for input(s): TSH, T4TOTAL, T3FREE, THYROIDAB in the last 72 hours.  Invalid input(s): FREET3 Anemia work up No results for input(s): VITAMINB12, FOLATE, FERRITIN, TIBC, IRON, RETICCTPCT in the last 72 hours. Urinalysis    Component Value Date/Time   COLORURINE YELLOW (A) 04/24/2019 1605   APPEARANCEUR CLEAR (A) 04/24/2019 1605   APPEARANCEUR Clear  02/16/2014 1206   LABSPEC 1.026 04/24/2019 1605   LABSPEC 1.016 02/16/2014 1206   PHURINE 6.0 04/24/2019 1605   GLUCOSEU NEGATIVE 04/24/2019 1605   GLUCOSEU Negative 02/16/2014 1206   HGBUR NEGATIVE 04/24/2019 1605   BILIRUBINUR NEGATIVE 04/24/2019 1605   BILIRUBINUR Negative 02/16/2014 1206   KETONESUR NEGATIVE 04/24/2019 1605   PROTEINUR NEGATIVE 04/24/2019 1605   NITRITE NEGATIVE 04/24/2019 1605   LEUKOCYTESUR NEGATIVE 04/24/2019 1605   LEUKOCYTESUR Negative 02/16/2014 1206   Sepsis Labs Invalid input(s): PROCALCITONIN,  WBC,  LACTICIDVEN Microbiology Recent Results (from the past 240 hour(s))  SARS Coronavirus 2 by RT PCR (hospital order, performed in [facility] hospital lab) Nasopharyngeal Nasopharyngeal Swab     Status: None   Collection Time: 12/04/19  1:12 AM   Specimen: Nasopharyngeal Swab  Result Value Ref Range Status   SARS Coronavirus 2 NEGATIVE NEGATIVE Final  Comment: (NOTE) SARS-CoV-2 target nucleic acids are NOT DETECTED. The SARS-CoV-2 RNA is generally detectable in upper and lower respiratory specimens during the acute phase of infection. The lowest concentration of SARS-CoV-2 viral copies this assay can detect is 250 copies / mL. A negative result does not preclude SARS-CoV-2 infection and should not be used as the sole basis for treatment or other patient management decisions.  A negative result may occur with improper specimen collection / handling, submission of specimen other than nasopharyngeal swab, presence of viral mutation(s) within the areas targeted by this assay, and inadequate number of viral copies (<250 copies / mL). A negative result must be combined with clinical observations, patient history, and epidemiological information. Fact Sheet for Patients:   StrictlyIdeas.no Fact Sheet for Healthcare Providers: BankingDealers.co.za This test is not yet approved or cleared  by the [place] FDA  and has been authorized for detection and/or diagnosis of SARS-CoV-2 by FDA under an Emergency Use Authorization (EUA).  This EUA will remain in effect (meaning this test can be used) for the duration of the COVID-19 declaration under Section 564(b)(1) of the Act, 21 U.S.C. section 360bbb-3(b)(1), unless the authorization is terminated or revoked sooner. Performed at [facility], [address] [place], [place] [address]      Time coordinating discharge: Over 30 minutes  SIGNED:   Desiree Hane, MD  [facility] 12/08/2019, 1:29 PM Pager   If 7PM-7AM, please contact night-coverage [email or web address] Password [email or web address]

## 2019-12-08 NOTE — Plan of Care (Signed)
  Problem: Education: Goal: Knowledge of [facility] Education information/materials will improve Outcome: Progressing Goal: Emotional status will improve Outcome: Progressing Goal: Mental status will improve Outcome: Progressing Goal: Verbalization of understanding the information provided will improve Outcome: Progressing   

## 2019-12-08 NOTE — Plan of Care (Signed)
  Problem: Education: Goal: Knowledge of [facility] Education information/materials will improve 12/08/2019 1304 by Luretha Murphy, RN Outcome: Adequate for Discharge 12/08/2019 1304 by Luretha Murphy, RN Outcome: Progressing Goal: Emotional status will improve 12/08/2019 1304 by Luretha Murphy, RN Outcome: Adequate for Discharge 12/08/2019 1304 by Luretha Murphy, RN Outcome: Progressing Goal: Mental status will improve 12/08/2019 1304 by Luretha Murphy, RN Outcome: Adequate for Discharge 12/08/2019 1304 by Luretha Murphy, RN Outcome: Progressing Goal: Verbalization of understanding the information provided will improve 12/08/2019 1304 by Luretha Murphy, RN Outcome: Adequate for Discharge 12/08/2019 1304 by Luretha Murphy, RN Outcome: Progressing

## 2019-12-08 NOTE — Discharge Instructions (Signed)
Acute Respiratory Failure, Adult  Acute respiratory failure occurs when there is not enough oxygen passing from your lungs to your body. When this happens, your lungs have trouble removing carbon dioxide from the blood. This causes your blood oxygen level to drop too low as carbon dioxide builds up. Acute respiratory failure is a medical emergency. It can develop quickly, but it is temporary if treated promptly. Your lung capacity, or how much air your lungs can hold, may improve with time, exercise, and treatment. What are the causes? There are many possible causes of acute respiratory failure, including:  Lung injury.  Chest injury or damage to the ribs or tissues near the lungs.  Lung conditions that affect the flow of air and blood into and out of the lungs, such as pneumonia, acute respiratory distress syndrome, and cystic fibrosis.  Medical conditions, such as strokes or spinal cord injuries, that affect the muscles and nerves that control breathing.  Blood infection (sepsis).  Inflammation of the pancreas (pancreatitis).  A blood clot in the lungs (pulmonary embolism).  A large-volume blood transfusion.  Burns.  Near-drowning.  Seizure.  Smoke inhalation.  Reaction to medicines.  Alcohol or drug overdose. What increases the risk? This condition is more likely to develop in people who have:  A blocked airway.  Asthma.  A condition or disease that damages or weakens the muscles, nerves, bones, or tissues that are involved in breathing.  A serious infection.  A health problem that blocks the unconscious reflex that is involved in breathing, such as hypothyroidism or sleep apnea.  A lung injury or trauma. What are the signs or symptoms? Trouble breathing is the main symptom of acute respiratory failure. Symptoms may also include:  Rapid breathing.  Restlessness or anxiety.  Skin, lips, or fingernails that appear blue (cyanosis).  Rapid heart  rate.  Abnormal heart rhythms (arrhythmias).  Confusion or changes in behavior.  Tiredness or loss of energy.  Feeling sleepy or having a loss of consciousness. How is this diagnosed? Your health care provider can diagnose acute respiratory failure with a medical history and physical exam. During the exam, your health care provider will listen to your heart and check for crackling or wheezing sounds in your lungs. Your may also have tests to confirm the diagnosis and determine what is causing respiratory failure. These tests may include:  Measuring the amount of oxygen in your blood (pulse oximetry). The measurement comes from a small device that is placed on your finger, earlobe, or toe.  Other blood tests to measure blood gases and to look for signs of infection.  Sampling your cerebral spinal fluid or tracheal fluid to check for infections.  Chest X-ray to look for fluid in spaces that should be filled with air.  Electrocardiogram (ECG) to look at the heart's electrical activity. How is this treated? Treatment for this condition usually takes places in a hospital intensive care unit (ICU). Treatment depends on what is causing the condition. It may include one or more treatments until your symptoms improve. Treatment may include:  Supplemental oxygen. Extra oxygen is given through a tube in the nose, a face mask, or a hood.  A device such as a continuous positive airway pressure (CPAP) or bi-level positive airway pressure (BiPAP or BPAP) machine. This treatment uses mild air pressure to keep the airways open. A mask or other device will be placed over your nose or mouth. A tube that is connected to a motor will deliver oxygen through the   mask.  Ventilator. This treatment helps move air into and out of the lungs. This may be done with a bag and mask or a machine. For this treatment, a tube is placed in your windpipe (trachea) so air and oxygen can flow to the lungs.  Extracorporeal  membrane oxygenation (ECMO). This treatment temporarily takes over the function of the heart and lungs, supplying oxygen and removing carbon dioxide. ECMO gives the lungs a chance to recover. It may be used if a ventilator is not effective.  Tracheostomy. This is a procedure that creates a hole in the neck to insert a breathing tube.  Receiving fluids and medicines.  Rocking the bed to help breathing. Follow these instructions at home:  Take over-the-counter and prescription medicines only as told by your health care provider.  Return to normal activities as told by your health care provider. Ask your health care provider what activities are safe for you.  Keep all follow-up visits as told by your health care provider. This is important. How is this prevented? Treating infections and medical conditions that may lead to acute respiratory failure can help prevent the condition from developing. Contact a health care provider if:  You have a fever.  Your symptoms do not improve or they get worse. Get help right away if:  You are having trouble breathing.  You lose consciousness.  Your have cyanosis or turn blue.  You develop a rapid heart rate.  You are confused. These symptoms may represent a serious problem that is an emergency. Do not wait to see if the symptoms will go away. Get medical help right away. Call your local emergency services (911 in the [place] Do not drive yourself to the hospital. This information is not intended to replace advice given to you by your health care provider. Make sure you discuss any questions you have with your health care provider. Document Revised: 06/21/2017 Document Reviewed: 01/25/2016 Elsevier Patient Education  2020 Elsevier Inc.  

## 2019-12-08 NOTE — Progress Notes (Signed)
Pt discharged home at this time via w/c/ no resp distress.  Instructions discussed with pt. Work note given.  meds diet activity and f/u discussed. Verbalize understanding.  No c/o. Sl d/cd. Earlier.

## 2019-12-08 NOTE — Progress Notes (Signed)
Date: 12/08/2019,   MRN# [id number] ZAFIR SCHAUER 15-Jul-1986 Code Status:     Code Status Orders  (From admission, onward)         Start     Ordered   12/04/19 0239  Full code  Continuous     12/04/19 0241        Code Status History    Date Active Date Inactive Code Status Order ID Comments User Context   10/02/2017 1708 10/05/2017 2133 Full Code [id number]  Shirley, [place], DO Inpatient   02/15/2014 0848 02/15/2014 1216 Full Code [id number]  [facility]   Advance Care Planning Activity     Hosp day:@LENGTHOFSTAYDAYS @ Referring MD: @ATDPROV @     PCP:      HPI: he up, off oxygen. Feeling btter, much less wheezing. No edema or calf pain.   PMHX:   Past Medical History:  Diagnosis Date  . Asthma    Surgical Hx:  History reviewed. No pertinent surgical history. Family Hx:  History reviewed. No pertinent family history. Social Hx:   Social History   Tobacco Use  . Smoking status: Current Every Day Smoker    Packs/day: 1.00    Years: 8.00    Pack years: 8.00  . Smokeless tobacco: Never Used  Substance Use Topics  . Alcohol use: Yes    Comment: 1-2 beer/daily  . Drug use: No   Medication:    Home Medication:    Current Medication: @CURMEDTAB @   Allergies:  Patient has no known allergies.  Review of Systems: Gen:  Denies  fever, sweats, chills HEENT: Denies blurred vision, double vision, ear pain, eye pain, hearing loss, nose bleeds, sore throat Cvc:  No dizziness, chest pain or heaviness Resp:  Much less wheezing, oe sob  Gi: Denies swallowing difficulty, stomach pain, nausea or vomiting, diarrhea, constipation, bowel incontinence Gu:  Denies bladder incontinence, burning urine Ext:   No Joint pain, stiffness or swelling Skin: No skin rash, easy bruising or bleeding or hives Endoc:  No polyuria, polydipsia , polyphagia or weight change Psych: No depression, insomnia or hallucinations  Other:  All other systems negative  Physical  Examination:   VS: BP 138/85 (BP Location: Left Arm)   Pulse 76   Temp 98.2 F (36.8 C) (Oral)   Resp 16   Ht 5\' 11"  (1.803 m)   Wt 117.9 kg   SpO2 94%   BMI 36.26 kg/m   General Appearance: No distress  Neuro: without focal findings, mental status, speech normal, alert and oriented, cranial nerves 2-12 intact, reflexes normal and symmetric, sensation grossly normal  HEENT: PERRLA, EOM intact, no ptosis, no other lesions noticed, Mallampati: Pulmonary:.+ wheezing but much improved, No rales  Moving more air  Cardiovascular:  Normal S1,S2.  No m/r/g.  Abdominal aorta pulsation normal.    Abdomen:Benign, Soft, non-tender, No masses, hepatosplenomegaly, No lymphadenopathy Endoc: No evident thyromegaly, no signs of acromegaly or Cushing features Skin:   warm, no rashes, no ecchymosis  Extremities: normal, no cyanosis, clubbing, no edema, warm with normal capillary refill. Other findings:   Labs results:   Recent Labs    12/06/19 0408 12/07/19 0532 12/07/19 1806 12/08/19 0524  HGB 14.2 14.7 15.4 15.2  HCT 42.5 44.2 46.0 43.8  MCV 87.6 88.0 87.6 85.0  WBC 16.6* 12.8* 10.7* 10.4  BUN 24* 25*  --  23*  CREATININE 0.85 0.92  --  0.98  GLUCOSE 137* 99  --  97  CALCIUM 9.1 9.1  --  9.2  ,     Assessment and Plan: His asthma is much improved. Base on this am exam, should be able to go home today with out patient f/u and close observation.  -IGE pending,  -continue prednisone with slow taper ( 40 x 2  days and come down in increment of 5 mg and d/c) -ventolin 2 puffs q 4-6 hors prn -d/c on dulera two puffs bid -per response will add singulair 10 mg q day -f/u in pulmonary in one week ( next Tuesday)  -further orders per above  I have personally obtained a history, examined the patient, evaluated laboratory and imaging results, formulated the assessment and plan and placed orders.  The Patient requires high complexity decision making for assessment and support, frequent  evaluation and titration of therapies, application of advanced monitoring technologies and extensive interpretation of multiple databases.   Herbon Fleming,M.D. Pulmonary & Critical care Medicine [facility]

## 2019-12-12 LAB — IGE: IgE (Immunoglobulin E), Serum: 13 IU/mL (ref 6–495)

## 2019-12-16 ENCOUNTER — Encounter: Payer: Self-pay | Admitting: Emergency Medicine

## 2019-12-16 ENCOUNTER — Ambulatory Visit (INDEPENDENT_AMBULATORY_CARE_PROVIDER_SITE_OTHER): Payer: 59 | Admitting: Emergency Medicine

## 2019-12-16 ENCOUNTER — Other Ambulatory Visit: Payer: Self-pay

## 2019-12-16 VITALS — BP 132/85 | HR 91 | Temp 98.6°F | Ht 71.0 in | Wt 261.4 lb

## 2019-12-16 DIAGNOSIS — Z09 Encounter for follow-up examination after completed treatment for conditions other than malignant neoplasm: Secondary | ICD-10-CM

## 2019-12-16 DIAGNOSIS — J988 Other specified respiratory disorders: Secondary | ICD-10-CM

## 2019-12-16 DIAGNOSIS — B9789 Other viral agents as the cause of diseases classified elsewhere: Secondary | ICD-10-CM

## 2019-12-16 DIAGNOSIS — Z8709 Personal history of other diseases of the respiratory system: Secondary | ICD-10-CM

## 2019-12-16 DIAGNOSIS — B370 Candidal stomatitis: Secondary | ICD-10-CM | POA: Diagnosis not present

## 2019-12-16 MED ORDER — NYSTATIN 100000 UNIT/ML MT SUSP: 5.0000 mL | Freq: Four times a day (QID) | OROMUCOSAL | 3 refills | Status: DC

## 2019-12-16 NOTE — Progress Notes (Signed)
Marc NielsenDaniel E Henderson 34 y.o.   Chief Complaint  Patient presents with  . Hospitalization Follow-up    discharged last Wednesday 12/09/19  . having trouble breathing    got better  . Fatigue    have not gotten stregnth back yet   Admit date: 12/04/2019 Discharge date: 12/08/2019  Admitted From: Home Disposition: Home  Recommendations for Outpatient Follow-up:  1. Follow up with PCP in 1-2 weeks 2. Follow-up with pulmonology arranged 3. Discharge albuterol as needed, Dulera, prolonged prednisone taper 4.   Home Health:None Equipment/Devices: None Discharge Condition: Stable CODE STATUS: Full   Brief/Interim Summary: History of present illness:  Marc MajorsDaniel E Heaveneris a 34 y.o.year old malewith medical history significant for moderate persistent asthma and tobacco abuse who presented on 5/14/2021with 2-day history of shortness of breath with recent sick contacts including family, prior to present to ED underwent rapid Covid test at local pharmacy which was negative and was found to have SPO2 of 88% on room air with persistent hypoxia requiring high flow oxygen as well as transitioning to BiPAP. Blood work notable for WBC of 15, procalcitonin less than 0.10, Covid testing negative, chest x-ray showed chronic interstitial changes without any acute abnormalities. Patient's O2 status improved on BiPAP and was able to wean to nasal cannula home  Hospital course complicated by fairly high O2 requirements.  Remaining hospital course addressed in problem based format below:   Hospital Course:   Acute hypoxic respiratory failure secondary to asthma exacerbation, suspect acute viral bronchitis (recent sick contacts, nonacute chest x-ray, Covid/flu testing negative) resolved.Able to wean off from 2 L to room air andmaintainednormal oxygen saturation with ambulation prior to discharge. No increased work of breathing. Bronchospasm significantly improved with addition of Dulera to  as needed albuterol as recommended by pulmonology -Patient will have follow-up with pulmonology as outpatient -Continue as needed albuterol, Dulera, prednisone taper on discharge -Supportive care with Mucinex, cough suppressant, continue flutter valve, incentive spirometry -Encourage tobacco use cessation  Moderate persistent asthma.   Prior to admission only on albuterol at home with no controller agent. Has been on Spiriva in the past, but limited by financial constraints related to insurance. Last evaluated by pulmonology on 11/2018 was found to have elevated eosinophils, patient has not followed-up since then -Continue as needed albuterol -Based on pulmonology recommendations will discharge on Dulera, prolonged prednisone taper -Follow-up with pulmonology as outpatient, follow-up IgE/eosinophil count  Leukocytosis, likely due to steroids, improving. Suspect demargination related to IV Solu-Medrol. Afebrile with no localizing signs or symptoms of infection, chest x-ray nonacute -WBC 10.7 on discharge  Methadone dependence, stable Continue methadone 15 mg p.o. daily per pharmacy -Continue methadone as outpatient in methadone clinic   Constipation in setting of opioid regimen. -Continue bowel regimen on discharge   Consultations: Pulmonology ( Dr. Meredeth IdeFleming)  HISTORY OF PRESENT ILLNESS: This is a 34 y.o. male here for hospital discharge follow-up.  Smoker with a history of asthma was admitted with acute respiratory failure secondary to respiratory illness.  Negative Covid test multiple times.  Discharge summary reviewed.  Feeling better and improving.  Stop smoking.  Still not 100% better.  Denies any new symptomatology. On Dulera, complaining of oral thrush.  HPI   Prior to Admission medications   Medication Sig Start Date End Date Taking? Authorizing Provider  albuterol (PROAIR HFA) 108 (90 Base) MCG/ACT inhaler Inhale 2 puffs into the lungs every 6 (six) hours as needed  for wheezing or shortness of breath. 08/27/19  Yes Marline Morace, Irving ShowsMiguel  Jose, MD  albuterol (PROVENTIL) (2.5 MG/3ML) 0.083% nebulizer solution Take 3 mLs (2.5 mg total) by nebulization every 6 (six) hours as needed for wheezing or shortness of breath. 08/27/19 12/16/19 Yes Ada Holness, Eilleen Kempf, MD  benzonatate (TESSALON) 100 MG capsule Take 1 capsule (100 mg total) by mouth 3 (three) times daily as needed for cough. 12/08/19  Yes Roberto Scales D, MD  dextromethorphan-guaiFENesin (MUCINEX DM) 30-600 MG 12hr tablet Take 1 tablet by mouth 2 (two) times daily as needed for cough. 12/08/19  Yes Roberto Scales D, MD  methadone (DOLOPHINE) 10 MG/5ML solution Take 115 mg by mouth every morning.   Yes [provider]  mometasone-formoterol (DULERA) 100-5 MCG/ACT AERO Inhale 2 puffs into the lungs 2 (two) times daily. 12/08/19 01/22/20 Yes Roberto Scales D, MD  predniSONE (DELTASONE) 5 MG tablet Take 40 mg ( 8 tabs) for 2 days,then 35 mg (7 tabs) for 2 days, then 30 mg ( 6 tabs) for 2 days, then 25 mg ( 5 tabs) for 2 days, then 20 mg ( 4 tabs) for 2 days, then 15mg  ( 3 tabs)for 2 days, then 10 mg (2 tabs) for 2 days, then 5 mg (1 tab) for two days 12/08/19  Yes 12/10/19, MD    No Known Allergies  Patient Active Problem List   Diagnosis Date Noted  . Methadone dependence (HCC) 12/04/2019  . Tobacco use disorder   . Alcohol abuse   . Chronic low back pain     Past Medical History:  Diagnosis Date  . Asthma     History reviewed. No pertinent surgical history.  Social History   Socioeconomic History  . Marital status: Married    Spouse name: Not on file  . Number of children: Not on file  . Years of education: Not on file  . Highest education level: Not on file  Occupational History  . Not on file  Tobacco Use  . Smoking status: Former Smoker    Packs/day: 1.00    Years: 8.00    Pack years: 8.00    Quit date: 12/02/2019    Years since quitting: 0.0  . Smokeless tobacco: Never Used    Substance and Sexual Activity  . Alcohol use: Yes    Comment: 1-2 beer/daily  . Drug use: No  . Sexual activity: Not on file  Other Topics Concern  . Not on file  Social History Narrative  . Not on file   Social Determinants of Health   Financial Resource Strain:   . Difficulty of Paying Living Expenses:   Food Insecurity:   . Worried About 02/01/2020 in the Last Year:   . Programme researcher, broadcasting/film/video in the Last Year:   Transportation Needs:   . Barista (Medical):   Freight forwarder Lack of Transportation (Non-Medical):   Physical Activity:   . Days of Exercise per Week:   . Minutes of Exercise per Session:   Stress:   . Feeling of Stress :   Social Connections:   . Frequency of Communication with Friends and Family:   . Frequency of Social Gatherings with Friends and Family:   . Attends Religious Services:   . Active Member of Clubs or Organizations:   . Attends [employer] Meetings:   Banker Marital Status:   Intimate Partner Violence:   . Fear of Current or Ex-Partner:   . Emotionally Abused:   [employer] Physically Abused:   . Sexually Abused:  History reviewed. No pertinent family history.   Review of Systems  Constitutional: Negative.  Negative for chills and fever.  HENT: Negative for sore throat.        Oral thrush  Respiratory: Negative.  Negative for cough, hemoptysis, sputum production, shortness of breath and wheezing.   Cardiovascular: Negative.  Negative for chest pain and palpitations.  Gastrointestinal: Negative.  Negative for abdominal pain, blood in stool, diarrhea, melena, nausea and vomiting.  Genitourinary: Negative.  Negative for dysuria and hematuria.  Musculoskeletal: Negative.  Negative for back pain, myalgias and neck pain.  Skin: Negative.  Negative for rash.  Neurological: Negative.  Negative for dizziness and headaches.  All other systems reviewed and are negative.  Today's Vitals   12/16/19 1524  BP: 132/85  Pulse: 91  Temp: 98.6  F (37 C)  TempSrc: Temporal  SpO2: 96%  Weight: 261 lb 6.4 oz (118.6 kg)  Height: 5\' 11"  (1.803 m)   Body mass index is 36.46 kg/m.   Physical Exam Vitals reviewed.  Constitutional:      Appearance: Normal appearance.  HENT:     Head: Normocephalic.     Mouth/Throat:     Comments: Oral thrush Eyes:     Extraocular Movements: Extraocular movements intact.     Conjunctiva/sclera: Conjunctivae normal.     Pupils: Pupils are equal, round, and reactive to light.  Cardiovascular:     Rate and Rhythm: Normal rate and regular rhythm.     Pulses: Normal pulses.     Heart sounds: Normal heart sounds.  Pulmonary:     Effort: Pulmonary effort is normal.     Breath sounds: Normal breath sounds.  Abdominal:     Palpations: Abdomen is soft.     Tenderness: There is no abdominal tenderness.  Musculoskeletal:        General: Normal range of motion.     Cervical back: Normal range of motion and neck supple.     Right lower leg: No edema.     Left lower leg: No edema.  Skin:    General: Skin is warm and dry.     Capillary Refill: Capillary refill takes less than 2 seconds.  Neurological:     General: No focal deficit present.     Mental Status: He is alert and oriented to person, place, and time.  Psychiatric:        Mood and Affect: Mood normal.        Behavior: Behavior normal.    A total of 30 minutes was spent with the patient, greater than 50% of which was in counseling/coordination of care regarding review of hospital discharge summary, review of most recent chest imaging, review of most recent blood work results, review of all medications, treatment of oral thrush have avoided, advised to rinse mouth after using Dulera, importance of smoking cessation, pulmonary toilette exercises, prognosis and need for follow-up.   ASSESSMENT & PLAN: Dailen was seen today for hospitalization follow-up, having trouble breathing and fatigue.  Diagnoses and all orders for this  visit:  Oral thrush -     nystatin (MYCOSTATIN) 100000 UNIT/ML suspension; Take 5 mLs (500,000 Units total) by mouth 4 (four) times daily.  History of asthma  Viral respiratory infection Comments: Much improved  Hospital discharge follow-up    Patient Instructions       If you have lab work done today you will be contacted with your lab results within the next 2 weeks.  If you have not heard  from [place] then please contact us. The fastest way to get your results is to register for My Chart.   IF you received an x-ray today, you will receive an invoice from [facility]. Please contact [facility] at [phone] with questions or concerns regarding your invoice.   IF you received labwork today, you will receive an invoice from [facility] [place] [facility]. Please contact [facility] at [phone] with questions or concerns regarding your invoice.   Our billing staff will not be able to assist you with questions regarding bills from these companies.  You will be contacted with the lab results as soon as they are available. The fastest way to get your results is to activate your My Chart account. Instructions are located on the last page of this paperwork. If you have not heard from [place] regarding the results in 2 weeks, please contact this office.      Asthma, Adult  Asthma is a long-term (chronic) condition in which the airways get tight and narrow. The airways are the breathing passages that lead from the nose and mouth down into the lungs. A person with asthma will have times when symptoms get worse. These are called asthma attacks. They can cause coughing, whistling sounds when you breathe (wheezing), shortness of breath, and chest pain. They can make it hard to breathe. There is no cure for asthma, but medicines and lifestyle changes can help control it. There are many things that can bring on an asthma attack or make asthma symptoms worse (triggers). Common triggers  include:  Mold.  Dust.  Cigarette smoke.  Cockroaches.  Things that can cause allergy symptoms (allergens). These include animal skin flakes (dander) and pollen from trees or grass.  Things that pollute the air. These may include household cleaners, wood smoke, smog, or chemical odors.  Cold air, weather changes, and wind.  Crying or laughing hard.  Stress.  Certain medicines or drugs.  Certain foods such as dried fruit, potato chips, and grape juice.  Infections, such as a cold or the flu.  Certain medical conditions or diseases.  Exercise or tiring activities. Asthma may be treated with medicines and by staying away from the things that cause asthma attacks. Types of medicines may include:  Controller medicines. These help prevent asthma symptoms. They are usually taken every day.  Fast-acting reliever or rescue medicines. These quickly relieve asthma symptoms. They are used as needed and provide short-term relief.  Allergy medicines if your attacks are brought on by allergens.  Medicines to help control the body's defense (immune) system. Follow these instructions at home: Avoiding triggers in your home  Change your heating and air conditioning filter often.  Limit your use of fireplaces and wood stoves.  Get rid of pests (such as roaches and mice) and their droppings.  Throw away plants if you see mold on them.  Clean your floors. Dust regularly. Use cleaning products that do not smell.  Have someone vacuum when you are not home. Use a vacuum cleaner with a HEPA filter if possible.  Replace carpet with wood, tile, or vinyl flooring. Carpet can trap animal skin flakes and dust.  Use allergy-proof pillows, mattress covers, and box spring covers.  Wash bed sheets and blankets every week in hot water. Dry them in a dryer.  Keep your bedroom free of any triggers.  Avoid pets and keep windows closed when things that cause allergy symptoms are in the  air.  Use blankets that are made of polyester or cotton.  Clean bathrooms and kitchens with bleach. If possible, have someone repaint the walls in these rooms with mold-resistant paint. Keep out of the rooms that are being cleaned and painted.  Wash your hands often with soap and water. If soap and water are not available, use hand sanitizer.  Do not allow anyone to smoke in your home. General instructions  Take over-the-counter and prescription medicines only as told by your doctor. ? Talk with your doctor if you have questions about how or when to take your medicines. ? Make note if you need to use your medicines more often than usual.  Do not use any products that contain nicotine or tobacco, such as cigarettes and e-cigarettes. If you need help quitting, ask your doctor.  Stay away from secondhand smoke.  Avoid doing things outdoors when allergen counts are high and when air quality is low.  Wear a ski mask when doing outdoor activities in the winter. The mask should cover your nose and mouth. Exercise indoors on cold days if you can.  Warm up before you exercise. Take time to cool down after exercise.  Use a peak flow meter as told by your doctor. A peak flow meter is a tool that measures how well the lungs are working.  Keep track of the peak flow meter's readings. Write them down.  Follow your asthma action plan. This is a written plan for taking care of your asthma and treating your attacks.  Make sure you get all the shots (vaccines) that your doctor recommends. Ask your doctor about a flu shot and a pneumonia shot.  Keep all follow-up visits as told by your doctor. This is important. Contact a doctor if:  You have wheezing, shortness of breath, or a cough even while taking medicine to prevent attacks.  The mucus you cough up (sputum) is thicker than usual.  The mucus you cough up changes from clear or white to yellow, green, gray, or bloody.  You have problems from  the medicine you are taking, such as: ? A rash. ? Itching. ? Swelling. ? Trouble breathing.  You need reliever medicines more than 2-3 times a week.  Your peak flow reading is still at 50-79% of your personal best after following the action plan for 1 hour.  You have a fever. Get help right away if:  You seem to be worse and are not responding to medicine during an asthma attack.  You are short of breath even at rest.  You get short of breath when doing very little activity.  You have trouble eating, drinking, or talking.  You have chest pain or tightness.  You have a fast heartbeat.  Your lips or fingernails start to turn blue.  You are light-headed or dizzy, or you faint.  Your peak flow is less than 50% of your personal best.  You feel too tired to breathe normally. Summary  Asthma is a long-term (chronic) condition in which the airways get tight and narrow. An asthma attack can make it hard to breathe.  Asthma cannot be cured, but medicines and lifestyle changes can help control it.  Make sure you understand how to avoid triggers and how and when to use your medicines. This information is not intended to replace advice given to you by your health care provider. Make sure you discuss any questions you have with your health care provider. Document Revised: 09/11/2018 Document Reviewed: 08/13/2016 Elsevier Patient Education  2020 [place]  Mitchel Honour, MD [facility]

## 2019-12-16 NOTE — Patient Instructions (Addendum)
   If you have lab work done today you will be contacted with your lab results within the next 2 weeks.  If you have not heard from us then please contact us. The fastest way to get your results is to register for My Chart.   IF you received an x-ray today, you will receive an invoice from [facility]. Please contact [facility] at [phone] with questions or concerns regarding your invoice.   IF you received labwork today, you will receive an invoice from [facility]. Please contact [facility] at [phone] with questions or concerns regarding your invoice.   Our billing staff will not be able to assist you with questions regarding bills from these companies.  You will be contacted with the lab results as soon as they are available. The fastest way to get your results is to activate your My Chart account. Instructions are located on the last page of this paperwork. If you have not heard from us regarding the results in 2 weeks, please contact this office.     Asthma, Adult  Asthma is a long-term (chronic) condition in which the airways get tight and narrow. The airways are the breathing passages that lead from the nose and mouth down into the lungs. A person with asthma will have times when symptoms get worse. These are called asthma attacks. They can cause coughing, whistling sounds when you breathe (wheezing), shortness of breath, and chest pain. They can make it hard to breathe. There is no cure for asthma, but medicines and lifestyle changes can help control it. There are many things that can bring on an asthma attack or make asthma symptoms worse (triggers). Common triggers include:  Mold.  Dust.  Cigarette smoke.  Cockroaches.  Things that can cause allergy symptoms (allergens). These include animal skin flakes (dander) and pollen from trees or grass.  Things that pollute the air. These may include household cleaners, wood smoke, smog, or chemical  odors.  Cold air, weather changes, and wind.  Crying or laughing hard.  Stress.  Certain medicines or drugs.  Certain foods such as dried fruit, potato chips, and grape juice.  Infections, such as a cold or the flu.  Certain medical conditions or diseases.  Exercise or tiring activities. Asthma may be treated with medicines and by staying away from the things that cause asthma attacks. Types of medicines may include:  Controller medicines. These help prevent asthma symptoms. They are usually taken every day.  Fast-acting reliever or rescue medicines. These quickly relieve asthma symptoms. They are used as needed and provide short-term relief.  Allergy medicines if your attacks are brought on by allergens.  Medicines to help control the body's defense (immune) system. Follow these instructions at home: Avoiding triggers in your home  Change your heating and air conditioning filter often.  Limit your use of fireplaces and wood stoves.  Get rid of pests (such as roaches and mice) and their droppings.  Throw away plants if you see mold on them.  Clean your floors. Dust regularly. Use cleaning products that do not smell.  Have someone vacuum when you are not home. Use a vacuum cleaner with a HEPA filter if possible.  Replace carpet with wood, tile, or vinyl flooring. Carpet can trap animal skin flakes and dust.  Use allergy-proof pillows, mattress covers, and box spring covers.  Wash bed sheets and blankets every week in hot water. Dry them in a dryer.  Keep your bedroom free of any triggers.    Avoid pets and keep windows closed when things that cause allergy symptoms are in the air.  Use blankets that are made of polyester or cotton.  Clean bathrooms and kitchens with bleach. If possible, have someone repaint the walls in these rooms with mold-resistant paint. Keep out of the rooms that are being cleaned and painted.  Wash your hands often with soap and water. If  soap and water are not available, use hand sanitizer.  Do not allow anyone to smoke in your home. General instructions  Take over-the-counter and prescription medicines only as told by your doctor. ? Talk with your doctor if you have questions about how or when to take your medicines. ? Make note if you need to use your medicines more often than usual.  Do not use any products that contain nicotine or tobacco, such as cigarettes and e-cigarettes. If you need help quitting, ask your doctor.  Stay away from secondhand smoke.  Avoid doing things outdoors when allergen counts are high and when air quality is low.  Wear a ski mask when doing outdoor activities in the winter. The mask should cover your nose and mouth. Exercise indoors on cold days if you can.  Warm up before you exercise. Take time to cool down after exercise.  Use a peak flow meter as told by your doctor. A peak flow meter is a tool that measures how well the lungs are working.  Keep track of the peak flow meter's readings. Write them down.  Follow your asthma action plan. This is a written plan for taking care of your asthma and treating your attacks.  Make sure you get all the shots (vaccines) that your doctor recommends. Ask your doctor about a flu shot and a pneumonia shot.  Keep all follow-up visits as told by your doctor. This is important. Contact a doctor if:  You have wheezing, shortness of breath, or a cough even while taking medicine to prevent attacks.  The mucus you cough up (sputum) is thicker than usual.  The mucus you cough up changes from clear or white to yellow, green, gray, or bloody.  You have problems from the medicine you are taking, such as: ? A rash. ? Itching. ? Swelling. ? Trouble breathing.  You need reliever medicines more than 2-3 times a week.  Your peak flow reading is still at 50-79% of your personal best after following the action plan for 1 hour.  You have a fever. Get help  right away if:  You seem to be worse and are not responding to medicine during an asthma attack.  You are short of breath even at rest.  You get short of breath when doing very little activity.  You have trouble eating, drinking, or talking.  You have chest pain or tightness.  You have a fast heartbeat.  Your lips or fingernails start to turn blue.  You are light-headed or dizzy, or you faint.  Your peak flow is less than 50% of your personal best.  You feel too tired to breathe normally. Summary  Asthma is a long-term (chronic) condition in which the airways get tight and narrow. An asthma attack can make it hard to breathe.  Asthma cannot be cured, but medicines and lifestyle changes can help control it.  Make sure you understand how to avoid triggers and how and when to use your medicines. This information is not intended to replace advice given to you by your health care provider. Make sure you discuss   any questions you have with your health care provider. Document Revised: 09/11/2018 Document Reviewed: 08/13/2016 Elsevier Patient Education  2020 [employer]  

## 2019-12-23 ENCOUNTER — Encounter: Payer: Self-pay | Admitting: Registered Nurse

## 2019-12-23 ENCOUNTER — Ambulatory Visit (INDEPENDENT_AMBULATORY_CARE_PROVIDER_SITE_OTHER): Payer: 59 | Admitting: Registered Nurse

## 2019-12-23 ENCOUNTER — Encounter (HOSPITAL_COMMUNITY): Payer: Self-pay | Admitting: Emergency Medicine

## 2019-12-23 ENCOUNTER — Emergency Department (HOSPITAL_COMMUNITY): Payer: 59

## 2019-12-23 ENCOUNTER — Ambulatory Visit (INDEPENDENT_AMBULATORY_CARE_PROVIDER_SITE_OTHER): Payer: 59

## 2019-12-23 ENCOUNTER — Emergency Department (HOSPITAL_COMMUNITY)
Admission: EM | Admit: 2019-12-23 | Discharge: 2019-12-23 | Disposition: A | Discharge status: Home / Self Care | Payer: 59 | Attending: Emergency Medicine | Admitting: Emergency Medicine

## 2019-12-23 ENCOUNTER — Other Ambulatory Visit: Payer: Self-pay

## 2019-12-23 VITALS — BP 131/87 | HR 97 | Temp 97.4°F | Resp 17 | Ht 71.0 in | Wt 261.0 lb

## 2019-12-23 DIAGNOSIS — Z79899 Other long term (current) drug therapy: Secondary | ICD-10-CM | POA: Diagnosis not present

## 2019-12-23 DIAGNOSIS — F1991 Other psychoactive substance use, unspecified, in remission: Secondary | ICD-10-CM

## 2019-12-23 DIAGNOSIS — R0602 Shortness of breath: Secondary | ICD-10-CM | POA: Diagnosis not present

## 2019-12-23 DIAGNOSIS — Z87891 Personal history of nicotine dependence: Secondary | ICD-10-CM | POA: Diagnosis not present

## 2019-12-23 DIAGNOSIS — F1990 Other psychoactive substance use, unspecified, uncomplicated: Secondary | ICD-10-CM

## 2019-12-23 DIAGNOSIS — R079 Chest pain, unspecified: Secondary | ICD-10-CM | POA: Diagnosis not present

## 2019-12-23 DIAGNOSIS — R609 Edema, unspecified: Secondary | ICD-10-CM | POA: Insufficient documentation

## 2019-12-23 DIAGNOSIS — R0789 Other chest pain: Secondary | ICD-10-CM | POA: Diagnosis present

## 2019-12-23 DIAGNOSIS — J45909 Unspecified asthma, uncomplicated: Secondary | ICD-10-CM | POA: Diagnosis not present

## 2019-12-23 DIAGNOSIS — Z87898 Personal history of other specified conditions: Secondary | ICD-10-CM | POA: Diagnosis not present

## 2019-12-23 HISTORY — DX: Other psychoactive substance abuse, uncomplicated: F19.10

## 2019-12-23 LAB — BASIC METABOLIC PANEL
Anion gap: 9 (ref 5–15)
Anion gap: 13 (ref 5–15)
BUN: 19 mg/dL (ref 6–20)
BUN: 22 mg/dL — ABNORMAL HIGH (ref 6–20)
CO2: 29 mmol/L (ref 22–32)
CO2: 27 mmol/L (ref 22–32)
Calcium: 9.4 mg/dL (ref 8.9–10.3)
Calcium: 9.1 mg/dL (ref 8.9–10.3)
Chloride: 100 mmol/L (ref 98–111)
Chloride: 99 mmol/L (ref 98–111)
Creatinine, Ser: 1.08 mg/dL (ref 0.61–1.24)
Creatinine, Ser: 0.99 mg/dL (ref 0.61–1.24)
GFR calc Af Amer: >60 mL/min (ref >60)
GFR calc Af Amer: >60 mL/min (ref >60)
GFR calc non Af Amer: >60 mL/min (ref >60)
GFR calc non Af Amer: >60 mL/min (ref >60)
Glucose, Bld: 121 mg/dL — ABNORMAL HIGH (ref 70–99)
Glucose, Bld: 110 mg/dL — ABNORMAL HIGH (ref 70–99)
Potassium: 5.2 mmol/L — ABNORMAL HIGH (ref 3.5–5.1)
Potassium: 4 mmol/L (ref 3.5–5.1)
Sodium: 138 mmol/L (ref 135–145)
Sodium: 139 mmol/L (ref 135–145)

## 2019-12-23 LAB — POCT CBC
Granulocyte percent: 80.7 %G — AB (ref 37–80)
HCT, POC: 46.6 % — AB (ref 29–41)
Hemoglobin: 15.6 g/dL — AB (ref 11–14.6)
Lymph, poc: 1.2 (ref 0.6–3.4)
MCH, POC: 28.8 pg (ref 27–31.2)
MCHC: 33.6 g/dL (ref 31.8–35.4)
MCV: 85.8 fL (ref 76–111)
MID (cbc): 0.1 (ref 0–0.9)
MPV: 6.2 fL (ref 0–99.8)
POC Granulocyte: 5.2 (ref 2–6.9)
POC LYMPH PERCENT: 17.8 %L (ref 10–50)
POC MID %: 1.5 %M (ref 0–12)
Platelet Count, POC: 324 10*3/uL (ref 142–424)
RBC: 5.43 M/uL (ref 4.69–6.13)
RDW, POC: 12.9 %
WBC: 6.5 10*3/uL (ref 4.6–10.2)

## 2019-12-23 LAB — CBC
HCT: 48 % (ref 39.0–52.0)
HCT: 46.2 % (ref 39.0–52.0)
Hemoglobin: 15.9 g/dL (ref 13.0–17.0)
Hemoglobin: 15.4 g/dL (ref 13.0–17.0)
MCH: 29 pg (ref 26.0–34.0)
MCH: 28.9 pg (ref 26.0–34.0)
MCHC: 33.1 g/dL (ref 30.0–36.0)
MCHC: 33.3 g/dL (ref 30.0–36.0)
MCV: 87.6 fL (ref 80.0–100.0)
MCV: 86.8 fL (ref 80.0–100.0)
Platelets: 281 10*3/uL (ref 150–400)
Platelets: 275 10*3/uL (ref 150–400)
RBC: 5.48 MIL/uL (ref 4.22–5.81)
RBC: 5.32 MIL/uL (ref 4.22–5.81)
RDW: 12.3 % (ref 11.5–15.5)
RDW: 12.3 % (ref 11.5–15.5)
WBC: 6.3 10*3/uL (ref 4.0–10.5)
WBC: 7.5 10*3/uL (ref 4.0–10.5)
nRBC: 0 % (ref 0.0–0.2)
nRBC: 0 % (ref 0.0–0.2)

## 2019-12-23 LAB — TROPONIN I (HIGH SENSITIVITY)
Troponin I (High Sensitivity): 4 ng/L (ref <18)
Troponin I (High Sensitivity): <2 ng/L (ref <18)

## 2019-12-23 LAB — DIFFERENTIAL
Abs Immature Granulocytes: 0.03 10*3/uL (ref 0.00–0.07)
Basophils Absolute: 0 10*3/uL (ref 0.0–0.1)
Basophils Relative: 1 %
Eosinophils Absolute: 0 10*3/uL (ref 0.0–0.5)
Eosinophils Relative: 0 %
Immature Granulocytes: 0 %
Lymphocytes Relative: 18 %
Lymphs Abs: 1.3 10*3/uL (ref 0.7–4.0)
Monocytes Absolute: 0.3 10*3/uL (ref 0.1–1.0)
Monocytes Relative: 4 %
Neutro Abs: 5.7 10*3/uL (ref 1.7–7.7)
Neutrophils Relative %: 77 %

## 2019-12-23 MED ORDER — SODIUM CHLORIDE 0.9% FLUSH
3.0000 mL | Freq: Once | INTRAVENOUS | Status: AC
  Administered 2019-12-23: 3 mL via INTRAVENOUS

## 2019-12-23 MED ORDER — IOHEXOL 350 MG/ML SOLN
80.0000 mL | Freq: Once | INTRAVENOUS | Status: AC | PRN
  Administered 2019-12-23: 80 mL via INTRAVENOUS

## 2019-12-23 NOTE — ED Provider Notes (Signed)
[facility] EMERGENCY DEPARTMENT Provider Note   CSN: [id number] Arrival date & time: 12/23/19  1251     History Chief Complaint  Patient presents with  . Fatigue  . Shortness of Breath    Marc Henderson is a 34 y.o. male resenting for evaluation of intermittent chest pain.  Patient states since his hospitalization a month ago, he has been having intermittent chest pain.  He states when he has pain, he describes it more as a pressure.  It is not worse with exertion, movement, positioning.  He does not have associated shortness of breath.  Nothing makes it better or worse.  Additionally, after using IV heroin 5 days ago, he felt like his legs got more swollen than they have been previously.  He does have a history of edema, takes a low-dose Lasix pill every day which she has been taking as prescribed.  He denies fevers, chills, sore throat, nausea, vomiting, abdominal pain, urinary symptoms, abnormal bowel movements.  He has a history of asthma, uses albuterol for that.  He is still on prednisone from his recent hospitalization.  He is not on any other medications.  Additional history obtained from chart review.  Reviewed admission last month for respiratory failure.  No CT scan was performed at that time.  HPI     Past Medical History:  Diagnosis Date  . Asthma   . IV drug abuse [facility])     Patient Active Problem List   Diagnosis Date Noted  . Methadone dependence (HCC) 12/04/2019  . Tobacco use disorder   . Alcohol abuse   . Chronic low back pain     No past surgical history on file.     No family history on file.  Social History   Tobacco Use  . Smoking status: Former Smoker    Packs/day: 1.00    Years: 8.00    Pack years: 8.00    Quit date: 12/02/2019    Years since quitting: 0.0  . Smokeless tobacco: Never Used  Substance Use Topics  . Alcohol use: Yes    Comment: 1-2 beer/daily  . Drug use: No    Home Medications Prior to Admission  medications   Medication Sig Start Date End Date Taking? Authorizing Provider  albuterol (PROAIR HFA) 108 (90 Base) MCG/ACT inhaler Inhale 2 puffs into the lungs every 6 (six) hours as needed for wheezing or shortness of breath. 08/27/19   Georgina Quint, MD  albuterol (PROVENTIL) (2.5 MG/3ML) 0.083% nebulizer solution Take 3 mLs (2.5 mg total) by nebulization every 6 (six) hours as needed for wheezing or shortness of breath. 08/27/19 12/16/19  Georgina Quint, MD  benzonatate (TESSALON) 100 MG capsule Take 1 capsule (100 mg total) by mouth 3 (three) times daily as needed for cough. 12/08/19   Laverna Peace, MD  dextromethorphan-guaiFENesin (MUCINEX DM) 30-600 MG 12hr tablet Take 1 tablet by mouth 2 (two) times daily as needed for cough. 12/08/19   Roberto Scales D, MD  methadone (DOLOPHINE) 10 MG/5ML solution Take 115 mg by mouth every morning.    [provider]  mometasone-formoterol (DULERA) 100-5 MCG/ACT AERO Inhale 2 puffs into the lungs 2 (two) times daily. 12/08/19 01/22/20  Roberto Scales D, MD  nystatin (MYCOSTATIN) 100000 UNIT/ML suspension Take 5 mLs (500,000 Units total) by mouth 4 (four) times daily. 12/16/19   Georgina Quint, MD  predniSONE (DELTASONE) 5 MG tablet Take 40 mg ( 8 tabs) for 2 days,then 35 mg (  7 tabs) for 2 days, then 30 mg ( 6 tabs) for 2 days, then 25 mg ( 5 tabs) for 2 days, then 20 mg ( 4 tabs) for 2 days, then 15mg  ( 3 tabs)for 2 days, then 10 mg (2 tabs) for 2 days, then 5 mg (1 tab) for two days 12/08/19   12/10/19, MD    Allergies    Patient has no known allergies.  Review of Systems   Review of Systems  Respiratory: Negative for shortness of breath.   Cardiovascular: Positive for chest pain (Chest pressure.  Intermittent.  None currently.) and leg swelling.  All other systems reviewed and are negative.   Physical Exam Updated Vital Signs BP 126/83 (BP Location: Left Arm)   Pulse 85   Temp 98.3 F (36.8 C) (Oral)   Resp 18    Ht 5\' 11"  (1.803 m)   Wt 118.8 kg   SpO2 94%   BMI 36.54 kg/m   Physical Exam Vitals and nursing note reviewed.  Constitutional:      General: He is not in acute distress.    Appearance: He is well-developed.     Comments: Resting comfortably in the bed in no acute distress  HENT:     Head: Normocephalic and atraumatic.  Eyes:     Extraocular Movements: Extraocular movements intact.     Conjunctiva/sclera: Conjunctivae normal.     Pupils: Pupils are equal, round, and reactive to light.  Cardiovascular:     Rate and Rhythm: Normal rate and regular rhythm.     Pulses: Normal pulses.     Heart sounds: No murmur.  Pulmonary:     Effort: Pulmonary effort is normal. No respiratory distress.     Breath sounds: Normal breath sounds. No wheezing.     Comments: Speaking in full sentences.  Clear lung sounds in all fields.  No wheezing, rales, rhonchi. Abdominal:     General: There is no distension.     Palpations: Abdomen is soft. There is no mass.     Tenderness: There is no abdominal tenderness. There is no guarding or rebound.  Musculoskeletal:        General: Normal range of motion.     Cervical back: Normal range of motion and neck supple.     Comments: No pitting edema on my exam.  Pedal pulses 2+ bilaterally.  Skin:    General: Skin is warm and dry.     Capillary Refill: Capillary refill takes less than 2 seconds.  Neurological:     Mental Status: He is alert and oriented to person, place, and time.     ED Results / Procedures / Treatments   Labs (all labs ordered are listed, but only abnormal results are displayed) Labs Reviewed  BASIC METABOLIC PANEL - Abnormal; Notable for the following components:      Result Value   Potassium 5.2 (*)    Glucose, Bld 121 (*)    All other components within normal limits  BASIC METABOLIC PANEL - Abnormal; Notable for the following components:   Glucose, Bld 110 (*)    BUN 22 (*)    All other components within normal limits    CULTURE, BLOOD (ROUTINE X 2)  CULTURE, BLOOD (ROUTINE X 2)  CBC  DIFFERENTIAL  CBC  TROPONIN I (HIGH SENSITIVITY)  TROPONIN I (HIGH SENSITIVITY)    EKG EKG Interpretation  Date/Time:  Wednesday December 23 2019 13:14:04 EDT Ventricular Rate:  97 PR Interval:  154 QRS  Duration: 82 QT Interval:  364 QTC Calculation: 462 R Axis:   77 Text Interpretation: Normal sinus rhythm Normal ECG No significant change since last tracing Confirmed by Gwyneth Sprout ([id number]) on 12/23/2019 4:29:16 PM   Radiology DG Chest 2 View  Result Date: 12/23/2019 CLINICAL DATA:  34 year old male with shortness of breath. EXAM: CHEST - 2 VIEW COMPARISON:  Earlier chest radiograph dated 12/23/2019. FINDINGS: No focal consolidation, pleural effusion, pneumothorax. The cardiac silhouette is within limits. No acute osseous pathology. IMPRESSION: No active cardiopulmonary disease. Electronically Signed   By: Elgie Collard M.D.   On: 12/23/2019 15:14   DG Chest 2 View  Result Date: 12/23/2019 CLINICAL DATA:  Shortness of breath, chest pain. EXAM: CHEST - 2 VIEW COMPARISON:  Dec 03, 2019. FINDINGS: The heart size and mediastinal contours are within normal limits. Both lungs are clear. No pneumothorax or pleural effusion is noted. The visualized skeletal structures are unremarkable. IMPRESSION: No active cardiopulmonary disease. Electronically Signed   By: Lupita Raider M.D.   On: 12/23/2019 12:14   CT Angio Chest PE W and/or Wo Contrast  Result Date: 12/23/2019 CLINICAL DATA:  Intermittent chest pain, shortness of breath and dizziness for 1 month. History of IV drug abuse. EXAM: CT ANGIOGRAPHY CHEST WITH CONTRAST TECHNIQUE: Multidetector CT imaging of the chest was performed using the standard protocol during bolus administration of intravenous contrast. Multiplanar CT image reconstructions and MIPs were obtained to evaluate the vascular anatomy. CONTRAST:  80 mL OMNIPAQUE IOHEXOL 350 MG/ML SOLN COMPARISON:  PA and  lateral chest today. CT abdomen and pelvis 04/24/2019. FINDINGS: Cardiovascular: Satisfactory opacification of the pulmonary arteries to the segmental level. No evidence of pulmonary embolism. Normal heart size. No pericardial effusion. Mediastinum/Nodes: No enlarged mediastinal, hilar, or axillary lymph nodes. Thyroid gland, trachea, and esophagus demonstrate no significant findings. Lungs/Pleura: Lungs are clear. No pleural effusion or pneumothorax. Upper Abdomen: 0.7 cm nonobstructing stone upper left kidney noted. Otherwise negative. Musculoskeletal: Negative. Review of the MIP images confirms the above findings. IMPRESSION: Negative for pulmonary embolus.  No acute abnormality. 0.7 cm nonobstructing stone upper pole left kidney. Electronically Signed   By: Drusilla Kanner M.D.   On: 12/23/2019 17:47    Procedures Procedures (including critical care time)  Medications Ordered in ED Medications  sodium chloride flush (NS) 0.9 % injection 3 mL (3 mLs Intravenous Given 12/23/19 1617)  iohexol (OMNIPAQUE) 350 MG/ML injection 80 mL (80 mLs Intravenous Contrast Given 12/23/19 1715)    ED Course  I have reviewed the triage vital signs and the nursing notes.  Pertinent labs & imaging results that were available during my care of the patient were reviewed by me and considered in my medical decision making (see chart for details).    MDM Rules/Calculators/A&P                       Patient presenting for evaluation of intermittent chest pressure.  On exam, patient appears nontoxic.  No abnormal lung sounds.  Patient is extremely concerned about endocarditis.  As patient is extremely well-appearing, without a fever, I have low suspicion for endocarditis, will obtain blood cultures to ensure no growth.  Troponin and EKG to rule out myocarditis.  Chest x-ray to rule out infection, cardiomegaly, pleural effusion.  CTA to rule out PE and for further evaluation of the lungs.   EKG shows normal sinus rhythm,  no significant change from previous.  Labs interpreted by me, overall reassuring.  Troponins negative  x2.  No leukocytosis.  Hemoglobin stable.  Electrolytes stable.  X-ray viewed interpreted by me, no pneumonia pneumothorax effusion, cardiomegaly.  CTA negative for PE.  Discussed findings with patient.  He remains stable on reevaluation.  Discussed follow-up with primary care for further evaluation of leg swelling and intermittent chest pain.  I do not believe he needs emergent work-up, considering his reassuring labs, work-up, and lack of risk factors.  Discussed results of blood cultures are pending, if positive he will receive a phone call.  At this time, patient appears safe for discharge.  Return precautions given.  Patient states he understands and agrees to plan.  Final Clinical Impression(s) / ED Diagnoses Final diagnoses:  Atypical chest pain  Peripheral edema  IVDU (intravenous drug user)    Rx / DC Orders ED Discharge Orders    None       Franchot Heidelberg, PA-C 12/23/19 1953    Blanchie Dessert, MD 12/24/19 2036

## 2019-12-23 NOTE — ED Notes (Signed)
C/o "no energy" and swelling in legs - hx of IV drug use x 1 yr, on methadone maintenance, has used IV heroin on Friday.  Swelling in legs, slight pedal edema noted, tibial edema.

## 2019-12-23 NOTE — ED Triage Notes (Signed)
Pt was in hospital 1 month ago. Pt has had worsening intermittent chest pain shortness of breath dizziness. None of those at present. Hx of IV drug abuse. Edema to legs has gotten worse. Sent to ED via pcp for rule out endocarditis vs pulmonary embolism. Last use Saturday.

## 2019-12-23 NOTE — Discharge Instructions (Signed)
Your work-up today was reassuring. Your results for your blood cultures are pending.  If positive, you will receive a phone call.  If negative, you will not.  Either way, you may check online MyChart. Follow-up with your primary care doctor for further evaluation and management of your chest pain and leg swelling. If you are going to be standing or sitting for long period of time, I recommend you wear compression socks to decrease leg swelling. Return to the emergency room if you develop any severe worsening chest pain, shortness of breath, or high fevers.  Return if any new, worsening, or concerning symptoms.

## 2019-12-23 NOTE — Patient Instructions (Signed)
° ° ° °  If you have lab work done today you will be contacted with your lab results within the next 2 weeks.  If you have not heard from us then please contact us. The fastest way to get your results is to register for My Chart. ° ° °IF you received an x-ray today, you will receive an invoice from [facility]. Please contact [facility] at [phone] with questions or concerns regarding your invoice.  ° °IF you received labwork today, you will receive an invoice from [facility]. Please contact [facility] at [phone] with questions or concerns regarding your invoice.  ° °Our billing staff will not be able to assist you with questions regarding bills from these companies. ° °You will be contacted with the lab results as soon as they are available. The fastest way to get your results is to activate your My Chart account. Instructions are located on the last page of this paperwork. If you have not heard from us regarding the results in 2 weeks, please contact this office. °  ° ° ° °

## 2019-12-24 ENCOUNTER — Telehealth: Payer: Self-pay | Admitting: Emergency Medicine

## 2019-12-24 LAB — COMPREHENSIVE METABOLIC PANEL
ALT: 31 IU/L (ref 0–44)
AST: 18 IU/L (ref 0–40)
Albumin/Globulin Ratio: 1.8 (ref 1.2–2.2)
Albumin: 4.4 g/dL (ref 4.0–5.0)
Alkaline Phosphatase: 69 IU/L (ref 48–121)
BUN/Creatinine Ratio: 19 (ref 9–20)
BUN: 20 mg/dL (ref 6–20)
Bilirubin Total: 0.3 mg/dL (ref 0.0–1.2)
CO2: 28 mmol/L (ref 20–29)
Calcium: 9.7 mg/dL (ref 8.7–10.2)
Chloride: 100 mmol/L (ref 96–106)
Creatinine, Ser: 1.08 mg/dL (ref 0.76–1.27)
GFR calc Af Amer: 104 mL/min/{1.73_m2} (ref >59)
GFR calc non Af Amer: 90 mL/min/{1.73_m2} (ref >59)
Globulin, Total: 2.5 g/dL (ref 1.5–4.5)
Glucose: 107 mg/dL — ABNORMAL HIGH (ref 65–99)
Potassium: 4.9 mmol/L (ref 3.5–5.2)
Sodium: 142 mmol/L (ref 134–144)
Total Protein: 6.9 g/dL (ref 6.0–8.5)

## 2019-12-24 LAB — HIV ANTIBODY (ROUTINE TESTING W REFLEX): HIV Screen 4th Generation wRfx: NONREACTIVE

## 2019-12-24 LAB — BRAIN NATRIURETIC PEPTIDE: BNP: 3.3 pg/mL (ref 0.0–100.0)

## 2019-12-24 LAB — SEDIMENTATION RATE: Sed Rate: 22 mm/hr — ABNORMAL HIGH (ref 0–15)

## 2019-12-24 LAB — HEPATITIS C ANTIBODY: Hep C Virus Ab: <0.1 s/co ratio (ref 0.0–0.9)

## 2019-12-24 NOTE — Telephone Encounter (Signed)
Seen by Janeece Agee yesterday regarding this.  Please send a message to him.  Thanks.

## 2019-12-24 NOTE — Telephone Encounter (Signed)
Pt called in and is needing an exstintion letter signed from either NP Kateri Plummer or Dr. Alvy Bimler for pt being out of work. Letter stating pt can come back to work around 01/04/20. Pt is wanting it sent to MyChart to send to his work. Pt would also like a nurse to give him a call per pt regarding "a werid symptom" he is having from the ED on 12/23/19. [phone] Please advise.

## 2019-12-24 NOTE — Telephone Encounter (Signed)
Pt is calling again and is wanting a nurse to give him a call regarding his MRI contrast pt states the injection sit is swelling up and would like a call. [phone] Please advise.

## 2019-12-24 NOTE — Telephone Encounter (Signed)
Pt notes following MRI with contrast he has swelling in his lymphnod and they are tender isolated to neck area wants to know what he should do for this MRI was yesterday no immediate reaction the swelling came on in middle of the night waking the pt    Pt would like work note for another week out of work as he is still struggling to get around  

## 2019-12-24 NOTE — Progress Notes (Signed)
Acute Office Visit  Subjective:    Patient ID: Marc Henderson, male    DOB: [birth year], 34 y.o.   MRN: [id number]  Chief Complaint  Patient presents with  . Leg Swelling    patient states he is still having some breathing issues and Dr Alvy Bimler wrote him some time off and he stil not ready to go back , because he is not able to pass the physical test for work to return. Per patient the swelling in both legs returned on saturday and heart racing fast with pressure periodically.    HPI Patient is in today for leg swelling and chest pressure.  About 1 mo ago had an asthma exacerbation complicated by viral respiratory infection. Was in hospital admitted 4 days, stabilized, and dc'd. Followed up with PCP Dr. Alvy Bimler 1 week later. Has been placed on inhalers and breathing has improved.  Was supposed to restart work this week but feels he is not ready. Having breathing issues on exertion, chest pressure, and increased leg swelling - particularly since use of IV heroin about 5 days prior to this visit.  He has planned to have a follow up with pulmonology - I will replace referral today.  He denies lightheadedness, dizziness, loc, nvd, claudication.  He cites his main concern as endocarditis given his hx of IV drug use.   Past Medical History:  Diagnosis Date  . Asthma   . IV drug abuse (HCC)     History reviewed. No pertinent surgical history.  History reviewed. No pertinent family history.  Social History   Socioeconomic History  . Marital status: Married    Spouse name: Not on file  . Number of children: Not on file  . Years of education: Not on file  . Highest education level: Not on file  Occupational History  . Not on file  Tobacco Use  . Smoking status: Former Smoker    Packs/day: 1.00    Years: 8.00    Pack years: 8.00    Quit date: 12/02/2019    Years since quitting: 0.0  . Smokeless tobacco: Never Used  Substance and Sexual Activity  . Alcohol use: Yes   Comment: 1-2 beer/daily  . Drug use: No  . Sexual activity: Not on file  Other Topics Concern  . Not on file  Social History Narrative  . Not on file   Social Determinants of Health   Financial Resource Strain:   . Difficulty of Paying Living Expenses:   Food Insecurity:   . Worried About Programme researcher, broadcasting/film/video in the Last Year:   . Barista in the Last Year:   Transportation Needs:   . Freight forwarder (Medical):   Marland Kitchen Lack of Transportation (Non-Medical):   Physical Activity:   . Days of Exercise per Week:   . Minutes of Exercise per Session:   Stress:   . Feeling of Stress :   Social Connections:   . Frequency of Communication with Friends and Family:   . Frequency of Social Gatherings with Friends and Family:   . Attends Religious Services:   . Active Member of Clubs or Organizations:   . Attends Banker Meetings:   Marland Kitchen Marital Status:   Intimate Partner Violence:   . Fear of Current or Ex-Partner:   . Emotionally Abused:   Marland Kitchen Physically Abused:   . Sexually Abused:     Outpatient Medications Prior to Visit  Medication Sig Dispense Refill  . albuterol (  PROAIR HFA) 108 (90 Base) MCG/ACT inhaler Inhale 2 puffs into the lungs every 6 (six) hours as needed for wheezing or shortness of breath. 18 g 11  . benzonatate (TESSALON) 100 MG capsule Take 1 capsule (100 mg total) by mouth 3 (three) times daily as needed for cough. 20 capsule 0  . dextromethorphan-guaiFENesin (MUCINEX DM) 30-600 MG 12hr tablet Take 1 tablet by mouth 2 (two) times daily as needed for cough. 20 tablet 0  . methadone (DOLOPHINE) 10 MG/5ML solution Take 115 mg by mouth every morning.    . mometasone-formoterol (DULERA) 100-5 MCG/ACT AERO Inhale 2 puffs into the lungs 2 (two) times daily. 0.1 g 0  . nystatin (MYCOSTATIN) 100000 UNIT/ML suspension Take 5 mLs (500,000 Units total) by mouth 4 (four) times daily. 60 mL 3  . predniSONE (DELTASONE) 5 MG tablet Take 40 mg ( 8 tabs) for 2  days,then 35 mg (7 tabs) for 2 days, then 30 mg ( 6 tabs) for 2 days, then 25 mg ( 5 tabs) for 2 days, then 20 mg ( 4 tabs) for 2 days, then 15mg  ( 3 tabs)for 2 days, then 10 mg (2 tabs) for 2 days, then 5 mg (1 tab) for two days 80 tablet 0  . albuterol (PROVENTIL) (2.5 MG/3ML) 0.083% nebulizer solution Take 3 mLs (2.5 mg total) by nebulization every 6 (six) hours as needed for wheezing or shortness of breath. 75 mL 12   No facility-administered medications prior to visit.    No Known Allergies  Review of Systems  Constitutional: Negative.   HENT: Negative.   Eyes: Negative.   Respiratory: Positive for chest tightness and shortness of breath. Negative for apnea, cough, choking, wheezing and stridor.   Cardiovascular: Positive for chest pain and leg swelling. Negative for palpitations.  Gastrointestinal: Negative.   Endocrine: Negative.   Genitourinary: Negative.   Musculoskeletal: Negative.   Skin: Negative.   Allergic/Immunologic: Negative.   Neurological: Negative.   Hematological: Negative.   Psychiatric/Behavioral: Negative.   All other systems reviewed and are negative.      Objective:    Physical Exam Vitals and nursing note reviewed.  Constitutional:      General: He is not in acute distress.    Appearance: Normal appearance. He is obese. He is not ill-appearing, toxic-appearing or diaphoretic.  Cardiovascular:     Rate and Rhythm: Normal rate and regular rhythm.     Pulses: Normal pulses.     Heart sounds: Normal heart sounds. No murmur. No friction rub. No gallop.   Pulmonary:     Effort: Pulmonary effort is normal. No respiratory distress.     Breath sounds: Normal breath sounds. No stridor. No wheezing, rhonchi or rales.  Chest:     Chest wall: No tenderness.  Skin:    General: Skin is warm and dry.     Capillary Refill: Capillary refill takes less than 2 seconds.     Coloration: Skin is not jaundiced or pale.     Findings: No bruising, erythema, lesion or  rash.  Neurological:     General: No focal deficit present.     Mental Status: He is alert and oriented to person, place, and time. Mental status is at baseline.  Psychiatric:        Mood and Affect: Mood normal.        Behavior: Behavior normal.        Thought Content: Thought content normal.        Judgment: Judgment normal.  BP 131/87   Pulse 97   Temp (!) 97.4 F (36.3 C) (Temporal)   Resp 17   Ht 5\' 11"  (1.803 m)   Wt 261 lb (118.4 kg)   SpO2 96%   BMI 36.40 kg/m  Wt Readings from Last 3 Encounters:  12/23/19 262 lb (118.8 kg)  12/23/19 261 lb (118.4 kg)  12/16/19 261 lb 6.4 oz (118.6 kg)    There are no preventive care reminders to display for this patient.  There are no preventive care reminders to display for this patient.   No results found for: TSH Lab Results  Component Value Date   WBC 7.5 12/23/2019   HGB 15.4 12/23/2019   HCT 46.2 12/23/2019   MCV 86.8 12/23/2019   PLT 275 12/23/2019   Lab Results  Component Value Date   NA 139 12/23/2019   K 4.0 12/23/2019   CO2 27 12/23/2019   GLUCOSE 110 (H) 12/23/2019   BUN 22 (H) 12/23/2019   CREATININE 0.99 12/23/2019   BILITOT 0.3 12/23/2019   ALKPHOS 69 12/23/2019   AST 18 12/23/2019   ALT 31 12/23/2019   PROT 6.9 12/23/2019   ALBUMIN 4.4 12/23/2019   CALCIUM 9.1 12/23/2019   ANIONGAP 13 12/23/2019   No results found for: CHOL No results found for: HDL No results found for: LDLCALC No results found for: TRIG No results found for: CHOLHDL No results found for: HGBA1C     Assessment & Plan:   Problem List Items Addressed This Visit    None    Visit Diagnoses    Shortness of breath    -  Primary   Relevant Orders   DG Chest 2 View (Completed)   EKG 12-Lead (Completed)   Brain natriuretic peptide (Completed)   Sedimentation Rate (Completed)   POCT CBC (Completed)   Comprehensive metabolic panel (Completed)   Chest pain, unspecified type       Relevant Orders   DG Chest 2 View  (Completed)   EKG 12-Lead (Completed)   Brain natriuretic peptide (Completed)   Sedimentation Rate (Completed)   POCT CBC (Completed)   Comprehensive metabolic panel (Completed)   History of drug use       Relevant Orders   HIV antibody (with reflex) (Completed)   Hepatitis C Antibody (Completed)   Comprehensive metabolic panel (Completed)       No orders of the defined types were placed in this encounter.  PLAN  My concern is for DVT/PE vs endocarditis, though I think a clot is the bigger concern given his current symptoms. Will refer to ED for work up.   Other concerns would be poorly controlled asthma that would warrant pulmonology follow up. It is likely that he is having a long course of recovery from his recent infection and this has been slowed by his use of heroin   Continue close follow up after ED visit  Patient encouraged to call clinic with any questions, comments, or concerns.  Maximiano Coss, NP

## 2019-12-25 ENCOUNTER — Encounter: Payer: Self-pay | Admitting: Registered Nurse

## 2019-12-25 NOTE — Telephone Encounter (Signed)
Monitor swelling, hydrate, take tylenol 500mg  PO q4h. If it does not resolve or if it worsens, he should come in for an appt. I have sent a work note via , [employer], NP

## 2019-12-25 NOTE — Telephone Encounter (Signed)
Pt notes following MRI with contrast he has swelling in his lymphnod and they are tender isolated to neck area wants to know what he should do for this MRI was yesterday no immediate reaction the swelling came on in middle of the night waking the pt    Pt would like work note for another week out of work as he is still struggling to get around

## 2019-12-28 LAB — CULTURE, BLOOD (ROUTINE X 2)
Culture: NO GROWTH
Culture: NO GROWTH
Special Requests: ADEQUATE

## 2019-12-28 NOTE — Telephone Encounter (Signed)
Pt has appt for Friday at 12:50

## 2020-01-01 ENCOUNTER — Ambulatory Visit (INDEPENDENT_AMBULATORY_CARE_PROVIDER_SITE_OTHER): Payer: 59 | Admitting: Registered Nurse

## 2020-01-01 ENCOUNTER — Encounter: Payer: Self-pay | Admitting: Registered Nurse

## 2020-01-01 ENCOUNTER — Other Ambulatory Visit: Payer: Self-pay

## 2020-01-01 VITALS — BP 120/81 | HR 85 | Temp 97.7°F | Resp 17 | Ht 71.0 in | Wt 265.0 lb

## 2020-01-01 DIAGNOSIS — Z130 Encounter for screening for diseases of the blood and blood-forming organs and certain disorders involving the immune mechanism: Secondary | ICD-10-CM | POA: Diagnosis not present

## 2020-01-01 DIAGNOSIS — Z13220 Encounter for screening for lipoid disorders: Secondary | ICD-10-CM | POA: Diagnosis not present

## 2020-01-01 DIAGNOSIS — Z13228 Encounter for screening for other metabolic disorders: Secondary | ICD-10-CM | POA: Diagnosis not present

## 2020-01-01 DIAGNOSIS — Z1329 Encounter for screening for other suspected endocrine disorder: Secondary | ICD-10-CM

## 2020-01-01 NOTE — Patient Instructions (Signed)
° ° ° °  If you have lab work done today you will be contacted with your lab results within the next 2 weeks.  If you have not heard from us then please contact us. The fastest way to get your results is to register for My Chart. ° ° °IF you received an x-ray today, you will receive an invoice from [facility]. Please contact [facility] at [phone] with questions or concerns regarding your invoice.  ° °IF you received labwork today, you will receive an invoice from [facility]. Please contact [facility] at [phone] with questions or concerns regarding your invoice.  ° °Our billing staff will not be able to assist you with questions regarding bills from these companies. ° °You will be contacted with the lab results as soon as they are available. The fastest way to get your results is to activate your My Chart account. Instructions are located on the last page of this paperwork. If you have not heard from us regarding the results in 2 weeks, please contact this office. °  ° ° ° °

## 2020-01-02 LAB — TSH: TSH: 1.9 u[IU]/mL (ref 0.450–4.500)

## 2020-01-02 LAB — LIPID PANEL
Chol/HDL Ratio: 5.3 ratio — ABNORMAL HIGH (ref 0.0–5.0)
Cholesterol, Total: 202 mg/dL — ABNORMAL HIGH (ref 100–199)
HDL: 38 mg/dL — ABNORMAL LOW (ref >39)
LDL Chol Calc (NIH): 83 mg/dL (ref 0–99)
Triglycerides: 499 mg/dL — ABNORMAL HIGH (ref 0–149)
VLDL Cholesterol Cal: 81 mg/dL — ABNORMAL HIGH (ref 5–40)

## 2020-01-02 LAB — HEMOGLOBIN A1C
Est. average glucose Bld gHb Est-mCnc: 111 mg/dL
Hgb A1c MFr Bld: 5.5 % (ref 4.8–5.6)

## 2020-03-24 ENCOUNTER — Other Ambulatory Visit: Payer: Self-pay | Admitting: Emergency Medicine

## 2020-03-24 DIAGNOSIS — J4540 Moderate persistent asthma, uncomplicated: Secondary | ICD-10-CM

## 2020-03-24 MED ORDER — ALBUTEROL SULFATE HFA 108 (90 BASE) MCG/ACT IN AERS: 2.0000 | INHALATION_SPRAY | Freq: Four times a day (QID) | RESPIRATORY_TRACT | 4 refills | Status: DC | PRN

## 2020-03-24 NOTE — Telephone Encounter (Signed)
Medication Refill - Medication: albuterol (PROVENTIL HFA;VENTOLIN HFA) 108 (90 Base) MCG/ACT inhaler 4 puff    Pt is completley out of his current supply, requested this a week ago via pharmacy   Has the patient contacted their pharmacy? Yes.   (Agent: If no, request that the patient contact the pharmacy for the refill.) (Agent: If yes, when and what did the pharmacy advise?)  Preferred Pharmacy (with phone number or street name):  [phone] [place] - [address]  [name] [place] [address]  Phone: [phone] Fax: [phone]     Agent: Please be advised that RX refills may take up to 3 business days. We ask that you follow-up with your pharmacy.

## 2020-04-12 ENCOUNTER — Encounter: Payer: Self-pay | Admitting: Registered Nurse

## 2020-04-12 NOTE — Progress Notes (Signed)
Established Patient Office Visit  Subjective:  Patient ID: Marc Henderson, male    DOB: 09-Jun-1986  Age: 34 y.o. MRN: [id number]  CC:  Chief Complaint  Patient presents with  . Follow-up    Follow up from swelling and would like to discuss going back to work    HPI [employer] presents for follow up from swelling  Improved, feeling ready to return to work  No further concerns at this time  Past Medical History:  Diagnosis Date  . Asthma   . IV drug abuse (HCC)     History reviewed. No pertinent surgical history.  History reviewed. No pertinent family history.  Social History   Socioeconomic History  . Marital status: Married    Spouse name: Not on file  . Number of children: Not on file  . Years of education: Not on file  . Highest education level: Not on file  Occupational History  . Not on file  Tobacco Use  . Smoking status: Former Smoker    Packs/day: 1.00    Years: 8.00    Pack years: 8.00    Quit date: 12/02/2019    Years since quitting: 0.3  . Smokeless tobacco: Never Used  Vaping Use  . Vaping Use: Former  Substance and Sexual Activity  . Alcohol use: Yes    Comment: 1-2 beer/daily  . Drug use: No  . Sexual activity: Not on file  Other Topics Concern  . Not on file  Social History Narrative  . Not on file   Social Determinants of Health   Financial Resource Strain:   . Difficulty of Paying Living Expenses: Not on file  Food Insecurity:   . Worried About Programme researcher, broadcasting/film/video in the Last Year: Not on file  . Ran Out of Food in the Last Year: Not on file  Transportation Needs:   . Lack of Transportation (Medical): Not on file  . Lack of Transportation (Non-Medical): Not on file  Physical Activity:   . Days of Exercise per Week: Not on file  . Minutes of Exercise per Session: Not on file  Stress:   . Feeling of Stress : Not on file  Social Connections:   . Frequency of Communication with Friends and Family: Not on file  .  Frequency of Social Gatherings with Friends and Family: Not on file  . Attends Religious Services: Not on file  . Active Member of Clubs or Organizations: Not on file  . Attends Banker Meetings: Not on file  . Marital Status: Not on file  Intimate Partner Violence:   . Fear of Current or Ex-Partner: Not on file  . Emotionally Abused: Not on file  . Physically Abused: Not on file  . Sexually Abused: Not on file    Outpatient Medications Prior to Visit  Medication Sig Dispense Refill  . benzonatate (TESSALON) 100 MG capsule Take 1 capsule (100 mg total) by mouth 3 (three) times daily as needed for cough. 20 capsule 0  . dextromethorphan-guaiFENesin (MUCINEX DM) 30-600 MG 12hr tablet Take 1 tablet by mouth 2 (two) times daily as needed for cough. 20 tablet 0  . methadone (DOLOPHINE) 10 MG/5ML solution Take 115 mg by mouth every morning.    . mometasone-formoterol (DULERA) 100-5 MCG/ACT AERO Inhale 2 puffs into the lungs 2 (two) times daily. 0.1 g 0  . nystatin (MYCOSTATIN) 100000 UNIT/ML suspension Take 5 mLs (500,000 Units total) by mouth 4 (four) times daily. 60 mL  3  . albuterol (PROAIR HFA) 108 (90 Base) MCG/ACT inhaler Inhale 2 puffs into the lungs every 6 (six) hours as needed for wheezing or shortness of breath. 18 g 11  . albuterol (PROVENTIL) (2.5 MG/3ML) 0.083% nebulizer solution Take 3 mLs (2.5 mg total) by nebulization every 6 (six) hours as needed for wheezing or shortness of breath. 75 mL 12  . predniSONE (DELTASONE) 5 MG tablet Take 40 mg ( 8 tabs) for 2 days,then 35 mg (7 tabs) for 2 days, then 30 mg ( 6 tabs) for 2 days, then 25 mg ( 5 tabs) for 2 days, then 20 mg ( 4 tabs) for 2 days, then 15mg  ( 3 tabs)for 2 days, then 10 mg (2 tabs) for 2 days, then 5 mg (1 tab) for two days (Patient not taking: Reported on 01/01/2020) 80 tablet 0   No facility-administered medications prior to visit.    No Known Allergies  ROS Review of Systems Negative on 12 pt ros     Objective:    Physical Exam Constitutional:      General: He is not in acute distress.    Appearance: Normal appearance. He is normal weight. He is not ill-appearing, toxic-appearing or diaphoretic.  Cardiovascular:     Rate and Rhythm: Normal rate and regular rhythm.     Heart sounds: Normal heart sounds. No murmur heard.  No friction rub. No gallop.   Pulmonary:     Effort: Pulmonary effort is normal. No respiratory distress.     Breath sounds: Normal breath sounds. No stridor. No wheezing, rhonchi or rales.  Chest:     Chest wall: No tenderness.  Neurological:     General: No focal deficit present.     Mental Status: He is alert and oriented to person, place, and time. Mental status is at baseline.  Psychiatric:        Mood and Affect: Mood normal.        Behavior: Behavior normal.        Thought Content: Thought content normal.        Judgment: Judgment normal.     BP 120/81   Pulse 85   Temp 97.7 F (36.5 C) (Temporal)   Resp 17   Ht 5\' 11"  (1.803 m)   Wt 265 lb (120.2 kg)   SpO2 95%   BMI 36.96 kg/m  Wt Readings from Last 3 Encounters:  01/01/20 265 lb (120.2 kg)  12/23/19 262 lb (118.8 kg)  12/23/19 261 lb (118.4 kg)     Health Maintenance Due  Topic Date Due  . COVID-19 Vaccine (1) Never done  . INFLUENZA VACCINE  02/21/2020    There are no preventive care reminders to display for this patient.  Lab Results  Component Value Date   TSH 1.900 01/01/2020   Lab Results  Component Value Date   WBC 7.5 12/23/2019   HGB 15.4 12/23/2019   HCT 46.2 12/23/2019   MCV 86.8 12/23/2019   PLT 275 12/23/2019   Lab Results  Component Value Date   NA 139 12/23/2019   K 4.0 12/23/2019   CO2 27 12/23/2019   GLUCOSE 110 (H) 12/23/2019   BUN 22 (H) 12/23/2019   CREATININE 0.99 12/23/2019   BILITOT 0.3 12/23/2019   ALKPHOS 69 12/23/2019   AST 18 12/23/2019   ALT 31 12/23/2019   PROT 6.9 12/23/2019   ALBUMIN 4.4 12/23/2019   CALCIUM 9.1 12/23/2019    ANIONGAP 13 12/23/2019   Lab Results  Component Value Date   CHOL 202 (H) 01/01/2020   Lab Results  Component Value Date   HDL 38 (L) 01/01/2020   Lab Results  Component Value Date   LDLCALC 83 01/01/2020   Lab Results  Component Value Date   TRIG 499 (H) 01/01/2020   Lab Results  Component Value Date   CHOLHDL 5.3 (H) 01/01/2020   Lab Results  Component Value Date   HGBA1C 5.5 01/01/2020      Assessment & Plan:   Problem List Items Addressed This Visit    None    Visit Diagnoses    Screening for endocrine, metabolic and immunity disorder    -  Primary   Relevant Orders   TSH (Completed)   Hemoglobin A1c (Completed)   Lipid screening       Relevant Orders   Lipid Panel (Completed)      No orders of the defined types were placed in this encounter.   Follow-up: No follow-ups on file.   PLAN  Ok to return to work  ED work up reassuring  Return precautions reviewed  Patient encouraged to call clinic with any questions, comments, or concerns.  Janeece Agee, NP

## 2020-06-22 ENCOUNTER — Other Ambulatory Visit: Payer: Self-pay | Admitting: Emergency Medicine

## 2020-06-22 DIAGNOSIS — J4540 Moderate persistent asthma, uncomplicated: Secondary | ICD-10-CM

## 2020-06-22 MED ORDER — ALBUTEROL SULFATE HFA 108 (90 BASE) MCG/ACT IN AERS
2.0000 | INHALATION_SPRAY | Freq: Four times a day (QID) | RESPIRATORY_TRACT | 2 refills | Status: DC | PRN
Start: 2020-06-22 — End: 2020-10-18

## 2020-06-22 NOTE — Telephone Encounter (Signed)
Okay to refill pt was last seen in June and doesn't have a follow up appointment scheduled.

## 2020-06-22 NOTE — Telephone Encounter (Signed)
   Notes to clinic:  Patient would like to have Ventolin instead of ProAir Review for change  Patient is out of medication   Requested Prescriptions  Pending Prescriptions Disp Refills   albuterol (PROAIR HFA) 108 (90 Base) MCG/ACT inhaler 18 g 4    Sig: Inhale 2 puffs into the lungs every 6 (six) hours as needed for wheezing or shortness of breath.      Pulmonology:  Beta Agonists Failed - 06/22/2020 10:00 AM      Failed - One inhaler should last at least one month. If the patient is requesting refills earlier, contact the patient to check for uncontrolled symptoms.      Passed - Valid encounter within last 12 months    Recent Outpatient Visits           5 months ago Screening for endocrine, metabolic and immunity disorder   [facility], Gerlene Burdock, NP   6 months ago Shortness of breath   [facility], Gerlene Burdock, NP   6 months ago Oral thrush   [facility], [place], [place]   10 months ago Non-intractable vomiting with nausea, unspecified vomiting type   [facility] at [facility], Eilleen Kempf, MD   1 year ago Chronic venous insufficiency   [facility] at [facility], Eilleen Kempf, MD

## 2020-06-22 NOTE — Telephone Encounter (Signed)
Pt needs refill for albuterol (PROAIR HFA) 108 (90 Base) MCG/ACT inhaler  Today/ Pt stated pharmacy has reached out to office /Pt also stated he gets the pharmacy to give him the Ventilin one and wanted to see if that can be sent instead since the proair doesn't work as well   [id number] [place] - [name] [place] [address]  Phone:  [phone] Fax:  [phone]

## 2020-07-06 ENCOUNTER — Ambulatory Visit (INDEPENDENT_AMBULATORY_CARE_PROVIDER_SITE_OTHER): Payer: 59 | Admitting: Emergency Medicine

## 2020-07-06 ENCOUNTER — Encounter: Payer: Self-pay | Admitting: Emergency Medicine

## 2020-07-06 ENCOUNTER — Other Ambulatory Visit: Payer: Self-pay

## 2020-07-06 VITALS — BP 121/75 | HR 68 | Temp 98.3°F | Resp 16 | Ht 71.0 in | Wt 255.0 lb

## 2020-07-06 DIAGNOSIS — R600 Localized edema: Secondary | ICD-10-CM

## 2020-07-06 DIAGNOSIS — J4540 Moderate persistent asthma, uncomplicated: Secondary | ICD-10-CM

## 2020-07-06 DIAGNOSIS — R400 Somnolence: Secondary | ICD-10-CM | POA: Diagnosis not present

## 2020-07-06 DIAGNOSIS — R29818 Other symptoms and signs involving the nervous system: Secondary | ICD-10-CM

## 2020-07-06 MED ORDER — ALBUTEROL SULFATE (2.5 MG/3ML) 0.083% IN NEBU: 2.5000 mg | INHALATION_SOLUTION | Freq: Four times a day (QID) | RESPIRATORY_TRACT | 12 refills | Status: DC | PRN

## 2020-07-06 MED ORDER — FUROSEMIDE 20 MG PO TABS: 20.0000 mg | ORAL_TABLET | Freq: Every day | ORAL | 3 refills | Status: DC

## 2020-07-06 MED ORDER — ALBUTEROL SULFATE HFA 108 (90 BASE) MCG/ACT IN AERS: 2.0000 | INHALATION_SPRAY | Freq: Four times a day (QID) | RESPIRATORY_TRACT | 7 refills | Status: DC | PRN

## 2020-07-06 NOTE — Patient Instructions (Addendum)
   If you have lab work done today you will be contacted with your lab results within the next 2 weeks.  If you have not heard from us then please contact us. The fastest way to get your results is to register for My Chart.   IF you received an x-ray today, you will receive an invoice from [facility]. Please contact [facility] at [phone] with questions or concerns regarding your invoice.   IF you received labwork today, you will receive an invoice from [facility]. Please contact [facility] at [phone] with questions or concerns regarding your invoice.   Our billing staff will not be able to assist you with questions regarding bills from these companies.  You will be contacted with the lab results as soon as they are available. The fastest way to get your results is to activate your My Chart account. Instructions are located on the last page of this paperwork. If you have not heard from us regarding the results in 2 weeks, please contact this office.      Health Maintenance, Male Adopting a healthy lifestyle and getting preventive care are important in promoting health and wellness. Ask your health care provider about:  The right schedule for you to have regular tests and exams.  Things you can do on your own to prevent diseases and keep yourself healthy. What should I know about diet, weight, and exercise? Eat a healthy diet   Eat a diet that includes plenty of vegetables, fruits, low-fat dairy products, and lean protein.  Do not eat a lot of foods that are high in solid fats, added sugars, or sodium. Maintain a healthy weight Body mass index (BMI) is a measurement that can be used to identify possible weight problems. It estimates body fat based on height and weight. Your health care provider can help determine your BMI and help you achieve or maintain a healthy weight. Get regular exercise Get regular exercise. This is one of the most important things you  can do for your health. Most adults should:  Exercise for at least 150 minutes each week. The exercise should increase your heart rate and make you sweat (moderate-intensity exercise).  Do strengthening exercises at least twice a week. This is in addition to the moderate-intensity exercise.  Spend less time sitting. Even light physical activity can be beneficial. Watch cholesterol and blood lipids Have your blood tested for lipids and cholesterol at 32 years of age, then have this test every 5 years. You may need to have your cholesterol levels checked more often if:  Your lipid or cholesterol levels are high.  You are older than 32 years of age.  You are at high risk for heart disease. What should I know about cancer screening? Many types of cancers can be detected early and may often be prevented. Depending on your health history and family history, you may need to have cancer screening at various ages. This may include screening for:  Colorectal cancer.  Prostate cancer.  Skin cancer.  Lung cancer. What should I know about heart disease, diabetes, and high blood pressure? Blood pressure and heart disease  High blood pressure causes heart disease and increases the risk of stroke. This is more likely to develop in people who have high blood pressure readings, are of African descent, or are overweight.  Talk with your health care provider about your target blood pressure readings.  Have your blood pressure checked: ? Every 3-5 years if you are 18-39   years of age. ? Every year if you are 40 years old or older.  If you are between the ages of 65 and 75 and are a current or former smoker, ask your health care provider if you should have a one-time screening for abdominal aortic aneurysm (AAA). Diabetes Have regular diabetes screenings. This checks your fasting blood sugar level. Have the screening done:  Once every three years after age 45 if you are at a normal weight and have  a low risk for diabetes.  More often and at a younger age if you are overweight or have a high risk for diabetes. What should I know about preventing infection? Hepatitis B If you have a higher risk for hepatitis B, you should be screened for this virus. Talk with your health care provider to find out if you are at risk for hepatitis B infection. Hepatitis C Blood testing is recommended for:  Everyone born from 1945 through 1965.  Anyone with known risk factors for hepatitis C. Sexually transmitted infections (STIs)  You should be screened each year for STIs, including gonorrhea and chlamydia, if: ? You are sexually active and are younger than 32 years of age. ? You are older than 32 years of age and your health care provider tells you that you are at risk for this type of infection. ? Your sexual activity has changed since you were last screened, and you are at increased risk for chlamydia or gonorrhea. Ask your health care provider if you are at risk.  Ask your health care provider about whether you are at high risk for HIV. Your health care provider may recommend a prescription medicine to help prevent HIV infection. If you choose to take medicine to prevent HIV, you should first get tested for HIV. You should then be tested every 3 months for as long as you are taking the medicine. Follow these instructions at home: Lifestyle  Do not use any products that contain nicotine or tobacco, such as cigarettes, e-cigarettes, and chewing tobacco. If you need help quitting, ask your health care provider.  Do not use street drugs.  Do not share needles.  Ask your health care provider for help if you need support or information about quitting drugs. Alcohol use  Do not drink alcohol if your health care provider tells you not to drink.  If you drink alcohol: ? Limit how much you have to 0-2 drinks a day. ? Be aware of how much alcohol is in your drink. In the [place] one drink equals one 12  oz bottle of beer (355 mL), one 5 oz glass of wine (148 mL), or one 1 oz glass of hard liquor (44 mL). General instructions  Schedule regular health, dental, and eye exams.  Stay current with your vaccines.  Tell your health care provider if: ? You often feel depressed. ? You have ever been abused or do not feel safe at home. Summary  Adopting a healthy lifestyle and getting preventive care are important in promoting health and wellness.  Follow your health care provider's instructions about healthy diet, exercising, and getting tested or screened for diseases.  Follow your health care provider's instructions on monitoring your cholesterol and blood pressure. This information is not intended to replace advice given to you by your health care provider. Make sure you discuss any questions you have with your health care provider. Document Revised: 07/02/2018 Document Reviewed: 07/02/2018 Elsevier Patient Education  2020 [employer]  

## 2020-07-06 NOTE — Progress Notes (Signed)
Marc Henderson 34 y.o.   Chief Complaint  Patient presents with  . Medication Refill    Albuterol Nebu and Ventolin HFA  . falling asleep    Per patient he falls asleep anytime it has been happening for 1 year    HISTORY OF PRESENT ILLNESS: This is a 34 y.o. male complaining of daytime somnolence for the past year. Has history of asthma requesting inhaler refills. No other complaints or medical concerns today.  HPI   Prior to Admission medications   Medication Sig Start Date End Date Taking? Authorizing Provider  albuterol (PROAIR HFA) 108 (90 Base) MCG/ACT inhaler Inhale 2 puffs into the lungs every 6 (six) hours as needed for wheezing or shortness of breath. 03/24/20  Yes Mathius Birkeland, Eilleen Kempf, MD  methadone (DOLOPHINE) 10 MG/5ML solution Take 115 mg by mouth every morning.   Yes [provider]  nystatin (MYCOSTATIN) 100000 UNIT/ML suspension Take 5 mLs (500,000 Units total) by mouth 4 (four) times daily. 12/16/19  Yes Nayan Proch, Eilleen Kempf, MD  albuterol (PROVENTIL) (2.5 MG/3ML) 0.083% nebulizer solution Take 3 mLs (2.5 mg total) by nebulization every 6 (six) hours as needed for wheezing or shortness of breath. 08/27/19 12/16/19  Georgina Quint, MD  albuterol (VENTOLIN HFA) 108 (90 Base) MCG/ACT inhaler Inhale 2 puffs into the lungs every 6 (six) hours as needed for wheezing or shortness of breath. 06/22/20   Janeece Agee, NP  mometasone-formoterol (DULERA) 100-5 MCG/ACT AERO Inhale 2 puffs into the lungs 2 (two) times daily. 12/08/19 01/22/20  Laverna Peace, MD    No Known Allergies  Patient Active Problem List   Diagnosis Date Noted  . Methadone dependence (HCC) 12/04/2019  . Tobacco use disorder   . Alcohol abuse   . Chronic low back pain     Past Medical History:  Diagnosis Date  . Asthma   . IV drug abuse (HCC)     History reviewed. No pertinent surgical history.  Social History   Socioeconomic History  . Marital status: Married    Spouse  name: Not on file  . Number of children: Not on file  . Years of education: Not on file  . Highest education level: Not on file  Occupational History  . Not on file  Tobacco Use  . Smoking status: Former Smoker    Packs/day: 1.00    Years: 8.00    Pack years: 8.00    Quit date: 12/02/2019    Years since quitting: 0.5  . Smokeless tobacco: Never Used  Vaping Use  . Vaping Use: Former  Substance and Sexual Activity  . Alcohol use: Yes    Comment: 1-2 beer/daily  . Drug use: No  . Sexual activity: Not on file  Other Topics Concern  . Not on file  Social History Narrative  . Not on file   Social Determinants of Health   Financial Resource Strain: Not on file  Food Insecurity: Not on file  Transportation Needs: Not on file  Physical Activity: Not on file  Stress: Not on file  Social Connections: Not on file  Intimate Partner Violence: Not on file    History reviewed. No pertinent family history.   Review of Systems  Constitutional: Negative.  Negative for chills and fever.  HENT: Negative.  Negative for congestion and sore throat.   Respiratory: Negative.  Negative for cough and shortness of breath.   Cardiovascular: Negative.  Negative for chest pain and palpitations.  Gastrointestinal: Negative for abdominal pain,  diarrhea, nausea and vomiting.  Genitourinary: Negative.  Negative for dysuria and hematuria.  Skin: Negative.  Negative for rash.  Neurological: Negative.  Negative for dizziness and headaches.  All other systems reviewed and are negative.    Physical Exam Vitals reviewed.  Constitutional:      Appearance: Normal appearance.  HENT:     Head: Normocephalic.  Eyes:     Extraocular Movements: Extraocular movements intact.     Pupils: Pupils are equal, round, and reactive to light.  Cardiovascular:     Rate and Rhythm: Normal rate.  Pulmonary:     Effort: Pulmonary effort is normal.  Musculoskeletal:        General: Normal range of motion.      Cervical back: Normal range of motion.  Skin:    General: Skin is warm and dry.  Neurological:     General: No focal deficit present.     Mental Status: He is alert and oriented to person, place, and time.  Psychiatric:        Mood and Affect: Mood normal.        Behavior: Behavior normal.      ASSESSMENT & PLAN: Marc BoomDaniel was seen today for medication refill and falling asleep.  Diagnoses and all orders for this visit:  Daytime somnolence -     Ambulatory referral to Neurology  Moderate persistent asthma without complication -     albuterol (PROVENTIL) (2.5 MG/3ML) 0.083% nebulizer solution; Take 3 mLs (2.5 mg total) by nebulization every 6 (six) hours as needed for wheezing or shortness of breath. -     albuterol (PROAIR HFA) 108 (90 Base) MCG/ACT inhaler; Inhale 2 puffs into the lungs every 6 (six) hours as needed for wheezing or shortness of breath.  Moderate persistent asthma, unspecified whether complicated -     albuterol (PROVENTIL) (2.5 MG/3ML) 0.083% nebulizer solution; Take 3 mLs (2.5 mg total) by nebulization every 6 (six) hours as needed for wheezing or shortness of breath.  Lower extremity edema -     furosemide (LASIX) 20 MG tablet; Take 1 tablet (20 mg total) by mouth daily.  Suspected sleep apnea -     Ambulatory referral to Neurology    Patient Instructions       If you have lab work done today you will be contacted with your lab results within the next 2 weeks.  If you have not heard from us then please contact us. The fastest way to get your results is to register for My Chart.   IF you received an x-ray today, you will receive an invoice from [facility]. Please contact [facility] at [phone] with questions or concerns regarding your invoice.   IF you received labwork today, you will receive an invoice from [facility]. Please contact [facility] at [phone] with questions or concerns regarding your invoice.   Our billing staff  will not be able to assist you with questions regarding bills from these companies.  You will be contacted with the lab results as soon as they are available. The fastest way to get your results is to activate your My Chart account. Instructions are located on the last page of this paperwork. If you have not heard from us regarding the results in 2 weeks, please contact this office.     Health Maintenance, Male Adopting a healthy lifestyle and getting preventive care are important in promoting health and wellness. Ask your health care provider about:  The right schedule for you to have  regular tests and exams.  Things you can do on your own to prevent diseases and keep yourself healthy. What should I know about diet, weight, and exercise? Eat a healthy diet   Eat a diet that includes plenty of vegetables, fruits, low-fat dairy products, and lean protein.  Do not eat a lot of foods that are high in solid fats, added sugars, or sodium. Maintain a healthy weight Body mass index (BMI) is a measurement that can be used to identify possible weight problems. It estimates body fat based on height and weight. Your health care provider can help determine your BMI and help you achieve or maintain a healthy weight. Get regular exercise Get regular exercise. This is one of the most important things you can do for your health. Most adults should:  Exercise for at least 150 minutes each week. The exercise should increase your heart rate and make you sweat (moderate-intensity exercise).  Do strengthening exercises at least twice a week. This is in addition to the moderate-intensity exercise.  Spend less time sitting. Even light physical activity can be beneficial. Watch cholesterol and blood lipids Have your blood tested for lipids and cholesterol at 32 years of age, then have this test every 5 years. You may need to have your cholesterol levels checked more often if:  Your lipid or cholesterol  levels are high.  You are older than 32 years of age.  You are at high risk for heart disease. What should I know about cancer screening? Many types of cancers can be detected early and may often be prevented. Depending on your health history and family history, you may need to have cancer screening at various ages. This may include screening for:  Colorectal cancer.  Prostate cancer.  Skin cancer.  Lung cancer. What should I know about heart disease, diabetes, and high blood pressure? Blood pressure and heart disease  High blood pressure causes heart disease and increases the risk of stroke. This is more likely to develop in people who have high blood pressure readings, are of African descent, or are overweight.  Talk with your health care provider about your target blood pressure readings.  Have your blood pressure checked: ? Every 3-5 years if you are 36-55 years of age. ? Every year if you are 73 years old or older.  If you are between the ages of 92 and 104 and are a current or former smoker, ask your health care provider if you should have a one-time screening for abdominal aortic aneurysm (AAA). Diabetes Have regular diabetes screenings. This checks your fasting blood sugar level. Have the screening done:  Once every three years after age 15 if you are at a normal weight and have a low risk for diabetes.  More often and at a younger age if you are overweight or have a high risk for diabetes. What should I know about preventing infection? Hepatitis B If you have a higher risk for hepatitis B, you should be screened for this virus. Talk with your health care provider to find out if you are at risk for hepatitis B infection. Hepatitis C Blood testing is recommended for:  Everyone born from [birth year] through 1963.  Anyone with known risk factors for hepatitis C. Sexually transmitted infections (STIs)  You should be screened each year for STIs, including gonorrhea and  chlamydia, if: ? You are sexually active and are younger than 32 years of age. ? You are older than 32 years of age and your  health care provider tells you that you are at risk for this type of infection. ? Your sexual activity has changed since you were last screened, and you are at increased risk for chlamydia or gonorrhea. Ask your health care provider if you are at risk.  Ask your health care provider about whether you are at high risk for HIV. Your health care provider may recommend a prescription medicine to help prevent HIV infection. If you choose to take medicine to prevent HIV, you should first get tested for HIV. You should then be tested every 3 months for as long as you are taking the medicine. Follow these instructions at home: Lifestyle  Do not use any products that contain nicotine or tobacco, such as cigarettes, e-cigarettes, and chewing tobacco. If you need help quitting, ask your health care provider.  Do not use street drugs.  Do not share needles.  Ask your health care provider for help if you need support or information about quitting drugs. Alcohol use  Do not drink alcohol if your health care provider tells you not to drink.  If you drink alcohol: ? Limit how much you have to 0-2 drinks a day. ? Be aware of how much alcohol is in your drink. In the [place] one drink equals one 12 oz bottle of beer (355 mL), one 5 oz glass of wine (148 mL), or one 1 oz glass of hard liquor (44 mL). General instructions  Schedule regular health, dental, and eye exams.  Stay current with your vaccines.  Tell your health care provider if: ? You often feel depressed. ? You have ever been abused or do not feel safe at home. Summary  Adopting a healthy lifestyle and getting preventive care are important in promoting health and wellness.  Follow your health care provider's instructions about healthy diet, exercising, and getting tested or screened for diseases.  Follow your health  care provider's instructions on monitoring your cholesterol and blood pressure. This information is not intended to replace advice given to you by your health care provider. Make sure you discuss any questions you have with your health care provider. Document Revised: 07/02/2018 Document Reviewed: 07/02/2018 Elsevier Patient Education  2020 Elsevier Inc.      Edwina Barth, MD [facility]

## 2020-08-02 ENCOUNTER — Encounter: Payer: Self-pay | Admitting: Neurology

## 2020-08-02 ENCOUNTER — Institutional Professional Consult (permissible substitution): Payer: 59 | Admitting: Neurology

## 2020-08-02 ENCOUNTER — Telehealth: Payer: Self-pay | Admitting: *Deleted

## 2020-08-02 NOTE — Telephone Encounter (Signed)
Patient was no show for new patient appointment today. 

## 2020-09-29 ENCOUNTER — Other Ambulatory Visit: Payer: Self-pay | Admitting: Emergency Medicine

## 2020-09-29 DIAGNOSIS — R600 Localized edema: Secondary | ICD-10-CM

## 2020-09-29 NOTE — Telephone Encounter (Signed)
Requested Prescriptions  Pending Prescriptions Disp Refills  . furosemide (LASIX) 20 MG tablet [Pharmacy Med Name: FUROSEMIDE 20 MG TABLET] 90 tablet 1    Sig: TAKE 1 TABLET BY MOUTH EVERY DAY     Cardiovascular:  Diuretics - Loop Passed - 09/29/2020  1:20 AM      Passed - K in normal range and within 360 days    Potassium  Date Value Ref Range Status  12/23/2019 4.0 3.5 - 5.1 mmol/L Final  02/16/2014 4.1 3.5 - 5.1 mmol/L Final         Passed - Ca in normal range and within 360 days    Calcium  Date Value Ref Range Status  12/23/2019 9.1 8.9 - 10.3 mg/dL Final   Calcium, Total  Date Value Ref Range Status  02/16/2014 9.3 8.5 - 10.1 mg/dL Final   Calcium, Ion  Date Value Ref Range Status  06/19/2008 1.20  Final         Passed - Na in normal range and within 360 days    Sodium  Date Value Ref Range Status  12/23/2019 139 135 - 145 mmol/L Final  12/23/2019 142 134 - 144 mmol/L Final  02/16/2014 138 136 - 145 mmol/L Final         Passed - Cr in normal range and within 360 days    Creatinine  Date Value Ref Range Status  02/16/2014 0.98 0.60 - 1.30 mg/dL Final   Creatinine, Ser  Date Value Ref Range Status  12/23/2019 0.99 0.61 - 1.24 mg/dL Final         Passed - Last BP in normal range    BP Readings from Last 1 Encounters:  07/06/20 121/75         Passed - Valid encounter within last 6 months    Recent Outpatient Visits          2 months ago Daytime somnolence   [facility] at [facility], Eilleen Kempf, MD   9 months ago Screening for endocrine, metabolic and immunity disorder   [facility], Gerlene Burdock, NP   9 months ago Shortness of breath   [facility], Gerlene Burdock, NP   9 months ago Oral thrush   Primary Care at [facility], Woodruff, MD   1 year ago Non-intractable vomiting with nausea, unspecified vomiting type   Primary Care at [facility], Eilleen Kempf, MD

## 2020-10-17 ENCOUNTER — Telehealth: Payer: Self-pay | Admitting: Emergency Medicine

## 2020-10-17 NOTE — Telephone Encounter (Signed)
LVM instructing pt to call back to get more info on the Albuterol refill.  Pt noted to have 7 refills to pharmacy on file.

## 2020-10-17 NOTE — Telephone Encounter (Signed)
Patient calling to request refill for inhaler. Advised Dr Alvy Bimler not available until 4/4. Patient states he has no medication remaining.  Please advise

## 2020-10-18 MED ORDER — ALBUTEROL SULFATE HFA 108 (90 BASE) MCG/ACT IN AERS: 2.0000 | INHALATION_SPRAY | Freq: Four times a day (QID) | RESPIRATORY_TRACT | 2 refills | Status: DC | PRN

## 2020-10-18 NOTE — Addendum Note (Signed)
Addended by: Claudette Laws D on: 10/18/2020 03:21 PM   Modules accepted: Orders

## 2020-10-18 NOTE — Telephone Encounter (Signed)
Pt states he no longer uses the Proair [facility] & is out of the 2 refills for the Ventolin HFA.  ProAir removed per pt request.  Refilled as requested.

## 2020-11-01 ENCOUNTER — Other Ambulatory Visit: Payer: Self-pay | Admitting: Emergency Medicine

## 2020-11-01 DIAGNOSIS — J4540 Moderate persistent asthma, uncomplicated: Secondary | ICD-10-CM

## 2020-12-06 ENCOUNTER — Ambulatory Visit (INDEPENDENT_AMBULATORY_CARE_PROVIDER_SITE_OTHER): Payer: 59 | Admitting: Internal Medicine

## 2020-12-06 ENCOUNTER — Telehealth: Payer: Self-pay | Admitting: Emergency Medicine

## 2020-12-06 ENCOUNTER — Encounter: Payer: Self-pay | Admitting: Internal Medicine

## 2020-12-06 ENCOUNTER — Other Ambulatory Visit: Payer: Self-pay

## 2020-12-06 ENCOUNTER — Ambulatory Visit (INDEPENDENT_AMBULATORY_CARE_PROVIDER_SITE_OTHER): Payer: 59

## 2020-12-06 VITALS — BP 104/82 | HR 85 | Temp 99.1°F | Ht 71.0 in | Wt 258.6 lb

## 2020-12-06 DIAGNOSIS — R0789 Other chest pain: Secondary | ICD-10-CM

## 2020-12-06 MED ORDER — METHYLPREDNISOLONE ACETATE 40 MG/ML IJ SUSP
40.0000 mg | Freq: Once | INTRAMUSCULAR | Status: AC
  Administered 2020-12-06: 40 mg via INTRAMUSCULAR

## 2020-12-06 NOTE — Telephone Encounter (Signed)
    Patient calling for xrays results and letter to be off work an additional day Return to work 5/19 Patient will print letter from [employer]

## 2020-12-06 NOTE — Progress Notes (Signed)
   Subjective:   Patient ID: Marc Henderson, male    DOB: 10/20/1985, 35 y.o.   MRN: [id number]  HPI The patient is a 35 YO man coming in for concerns about chest pain right sided. Started abruptly with walking his dog and she pulled the leash. He felt a popping sensation in the rib. This has been present and stable since. Denies SOB but pain with deep breath. Has asthma but no worse than usual. Overall 8/10 pain.   Review of Systems  Constitutional: Negative.   HENT: Negative.   Eyes: Negative.   Respiratory: Negative for cough, chest tightness and shortness of breath.   Cardiovascular: Positive for chest pain. Negative for palpitations and leg swelling.  Gastrointestinal: Negative for abdominal distention, abdominal pain, constipation, diarrhea, nausea and vomiting.  Musculoskeletal: Negative.   Skin: Negative.   Neurological: Negative.   Psychiatric/Behavioral: Negative.     Objective:  Physical Exam Constitutional:      Appearance: He is well-developed.  HENT:     Head: Normocephalic and atraumatic.  Cardiovascular:     Rate and Rhythm: Normal rate and regular rhythm.  Pulmonary:     Effort: Pulmonary effort is normal. No respiratory distress.     Breath sounds: Normal breath sounds. No wheezing or rales.     Comments: Pain right lower ribcage to palpation, no fracture or deformity detected Chest:     Chest wall: Tenderness present.  Abdominal:     General: Bowel sounds are normal. There is no distension.     Palpations: Abdomen is soft.     Tenderness: There is no abdominal tenderness. There is no rebound.  Musculoskeletal:     Cervical back: Normal range of motion.  Skin:    General: Skin is warm and dry.  Neurological:     Mental Status: He is alert and oriented to person, place, and time.     Coordination: Coordination normal.     Vitals:   12/06/20 1049  BP: 104/82  Pulse: 85  Temp: 99.1 F (37.3 C)  TempSrc: Oral  SpO2: 97%  Weight: 258 lb 9.6 oz  (117.3 kg)  Height: 5\' 11"  (1.803 m)    This visit occurred during the SARS-CoV-2 public health emergency.  Safety protocols were in place, including screening questions prior to the visit, additional usage of staff PPE, and extensive cleaning of exam room while observing appropriate contact time as indicated for disinfecting solutions.   Assessment & Plan:  Depo-medrol 40 mg IM given at visit

## 2020-12-06 NOTE — Assessment & Plan Note (Signed)
Checking CXR to rule out spontaneous pneumothorax. Likely rib dislocation. Advised healing would take several weeks. Given depo-medrol 40 mg IM to help reduce inflammation.

## 2020-12-06 NOTE — Patient Instructions (Signed)
We are checking the x-ray today.  This will go away on its own within a couple of weeks. You can take tylenol for pain over the counter and use heat or salon pas over the counter.    Neck Exercises Ask your health care provider which exercises are safe for you. Do exercises exactly as told by your health care provider and adjust them as directed. It is normal to feel mild stretching, pulling, tightness, or discomfort as you do these exercises. Stop right away if you feel sudden pain or your pain gets worse. Do not begin these exercises until told by your health care provider. Neck exercises can be important for many reasons. They can improve strength and maintain flexibility in your neck, which will help your upper back and prevent neck pain. Stretching exercises Rotation neck stretching 1. Sit in a chair or stand up. 2. Place your feet flat on the floor, shoulder width apart. 3. Slowly turn your head (rotate) to the right until a slight stretch is felt. Turn it all the way to the right so you can look over your right shoulder. Do not tilt or tip your head. 4. Hold this position for 10-30 seconds. 5. Slowly turn your head (rotate) to the left until a slight stretch is felt. Turn it all the way to the left so you can look over your left shoulder. Do not tilt or tip your head. 6. Hold this position for 10-30 seconds. Repeat __________ times. Complete this exercise __________ times a day.   Neck retraction 1. Sit in a sturdy chair or stand up. 2. Look straight ahead. Do not bend your neck. 3. Use your fingers to push your chin backward (retraction). Do not bend your neck for this movement. Continue to face straight ahead. If you are doing the exercise properly, you will feel a slight sensation in your throat and a stretch at the back of your neck. 4. Hold the stretch for 1-2 seconds. Repeat __________ times. Complete this exercise __________ times a day. Strengthening exercises Neck  press 1. Lie on your back on a firm bed or on the floor with a pillow under your head. 2. Use your neck muscles to push your head down on the pillow and straighten your spine. 3. Hold the position as well as you can. Keep your head facing up (in a neutral position) and your chin tucked. 4. Slowly count to 5 while holding this position. Repeat __________ times. Complete this exercise __________ times a day. Isometrics These are exercises in which you strengthen the muscles in your neck while keeping your neck still (isometrics). 1. Sit in a supportive chair and place your hand on your forehead. 2. Keep your head and face facing straight ahead. Do not flex or extend your neck while doing isometrics. 3. Push forward with your head and neck while pushing back with your hand. Hold for 10 seconds. 4. Do the sequence again, this time putting your hand against the back of your head. Use your head and neck to push backward against the hand pressure. 5. Finally, do the same exercise on either side of your head, pushing sideways against the pressure of your hand. Repeat __________ times. Complete this exercise __________ times a day. Prone head lifts 1. Lie face-down (prone position), resting on your elbows so that your chest and upper back are raised. 2. Start with your head facing downward, near your chest. Position your chin either on or near your chest. 3. Slowly lift  your head upward. Lift until you are looking straight ahead. Then continue lifting your head as far back as you can comfortably stretch. 4. Hold your head up for 5 seconds. Then slowly lower it to your starting position. Repeat __________ times. Complete this exercise __________ times a day. Supine head lifts 1. Lie on your back (supine position), bending your knees to point to the ceiling and keeping your feet flat on the floor. 2. Lift your head slowly off the floor, raising your chin toward your chest. 3. Hold for 5 seconds. Repeat  __________ times. Complete this exercise __________ times a day. Scapular retraction 1. Stand with your arms at your sides. Look straight ahead. 2. Slowly pull both shoulders (scapulae) backward and downward (retraction) until you feel a stretch between your shoulder blades in your upper back. 3. Hold for 10-30 seconds. 4. Relax and repeat. Repeat __________ times. Complete this exercise __________ times a day. Contact a health care provider if:  Your neck pain or discomfort gets much worse when you do an exercise.  Your neck pain or discomfort does not improve within 2 hours after you exercise. If you have any of these problems, stop exercising right away. Do not do the exercises again unless your health care provider says that you can. Get help right away if:  You develop sudden, severe neck pain. If this happens, stop exercising right away. Do not do the exercises again unless your health care provider says that you can. This information is not intended to replace advice given to you by your health care provider. Make sure you discuss any questions you have with your health care provider. Document Revised: 05/07/2018 Document Reviewed: 05/07/2018 Elsevier Patient Education  2021 [employer].

## 2020-12-08 NOTE — Telephone Encounter (Signed)
  Please release updated letter to [facility]

## 2020-12-08 NOTE — Telephone Encounter (Signed)
Letter has been updated. Patient has been made aware.

## 2021-02-06 ENCOUNTER — Telehealth: Payer: Self-pay | Admitting: Emergency Medicine

## 2021-02-06 DIAGNOSIS — J4540 Moderate persistent asthma, uncomplicated: Secondary | ICD-10-CM

## 2021-02-06 NOTE — Telephone Encounter (Signed)
   Patient requesting refill for VENTOLIN HFA 108 (90 Base) MCG/ACT inhaler   Pharmacy [facility] [phone] - [place], [place] - [address]

## 2021-02-07 MED ORDER — ALBUTEROL SULFATE HFA 108 (90 BASE) MCG/ACT IN AERS: INHALATION_SPRAY | RESPIRATORY_TRACT | 7 refills | Status: DC

## 2021-02-07 NOTE — Telephone Encounter (Signed)
Medication refilled

## 2021-03-26 ENCOUNTER — Other Ambulatory Visit: Payer: Self-pay | Admitting: Emergency Medicine

## 2021-03-26 DIAGNOSIS — J4540 Moderate persistent asthma, uncomplicated: Secondary | ICD-10-CM

## 2021-03-31 ENCOUNTER — Other Ambulatory Visit: Payer: Self-pay | Admitting: Emergency Medicine

## 2021-03-31 DIAGNOSIS — R600 Localized edema: Secondary | ICD-10-CM

## 2021-04-28 ENCOUNTER — Ambulatory Visit
Admission: EM | Admit: 2021-04-28 | Discharge: 2021-04-28 | Disposition: A | Discharge status: Home / Self Care | Payer: 59 | Attending: Emergency Medicine | Admitting: Emergency Medicine

## 2021-04-28 ENCOUNTER — Ambulatory Visit (INDEPENDENT_AMBULATORY_CARE_PROVIDER_SITE_OTHER): Payer: 59

## 2021-04-28 ENCOUNTER — Other Ambulatory Visit: Payer: Self-pay

## 2021-04-28 ENCOUNTER — Encounter: Payer: Self-pay | Admitting: Emergency Medicine

## 2021-04-28 DIAGNOSIS — M25571 Pain in right ankle and joints of right foot: Secondary | ICD-10-CM

## 2021-04-28 DIAGNOSIS — M25471 Effusion, right ankle: Secondary | ICD-10-CM

## 2021-04-28 NOTE — ED Triage Notes (Signed)
Pt twist right ankle today.

## 2021-04-28 NOTE — Discharge Instructions (Addendum)
Take Tylenol or ibuprofen as needed for discomfort.  Rest and elevate your ankle.  Apply ice packs 2-3 times a day for up to 20 minutes each.  Wear the walking boot and use the crutches.    Follow up with an orthopedist.

## 2021-04-28 NOTE — ED Provider Notes (Signed)
Marc Henderson    CSN: [id number] Arrival date & time: 04/28/21  1747      History   Chief Complaint Chief Complaint  Patient presents with   Ankle Injury    Right    HPI Marc Henderson is a 35 y.o. male.  Patient presents with pain and swelling of his right ankle since he twisted it earlier today.  Patient states accidentally stepped off a brick which caused his ankle to roll.  He denies numbness, weakness, paresthesias, open wounds, or other symptoms.  Treatment at home with decreased weightbearing.  His medical history includes asthma, IV drug abuse, alcohol abuse, chronic low back pain, methadone dependence.  The history is provided by the patient and medical records.   Past Medical History:  Diagnosis Date   Asthma    IV drug abuse [facility])     Patient Active Problem List   Diagnosis Date Noted   Right-sided chest wall pain 12/06/2020   Methadone dependence (HCC) 12/04/2019   Tobacco use disorder    Alcohol abuse    Chronic low back pain     History reviewed. No pertinent surgical history.     Home Medications    Prior to Admission medications   Medication Sig Start Date End Date Taking? Authorizing Provider  albuterol (PROVENTIL) (2.5 MG/3ML) 0.083% nebulizer solution Take 3 mLs (2.5 mg total) by nebulization every 6 (six) hours as needed for wheezing or shortness of breath. 07/06/20 04/28/21 Yes Sagardia, Eilleen Kempf, MD  albuterol (VENTOLIN HFA) 108 (90 Base) MCG/ACT inhaler TAKE 2 PUFFS BY MOUTH EVERY 6 HOURS AS NEEDED FOR WHEEZE OR SHORTNESS OF BREATH 03/26/21  Yes Sagardia, Eilleen Kempf, MD  methadone (DOLOPHINE) 10 MG/5ML solution Take 115 mg by mouth every morning.   Yes [provider]  amoxicillin (AMOXIL) 500 MG capsule Take by mouth. 04/22/21   [provider]  furosemide (LASIX) 20 MG tablet TAKE 1 TABLET BY MOUTH EVERY DAY 09/29/20   Georgina Quint, MD  mometasone-formoterol [facility]) 100-5 MCG/ACT AERO Inhale 2 puffs into  the lungs 2 (two) times daily. 12/08/19 01/22/20  Roberto Scales D, MD  nystatin (MYCOSTATIN) 100000 UNIT/ML suspension Take 5 mLs (500,000 Units total) by mouth 4 (four) times daily. Patient not taking: Reported on 12/06/2020 12/16/19   Georgina Quint, MD    Family History History reviewed. No pertinent family history.  Social History Social History   Tobacco Use   Smoking status: Former    Packs/day: 1.00    Years: 8.00    Pack years: 8.00    Types: Cigarettes    Quit date: 12/02/2019    Years since quitting: 1.4   Smokeless tobacco: Never  Vaping Use   Vaping Use: Former  Substance Use Topics   Alcohol use: Yes    Comment: 1-2 beer/daily   Drug use: No     Allergies   Patient has no known allergies.   Review of Systems Review of Systems  Constitutional:  Negative for chills and fever.  Respiratory:  Negative for cough and shortness of breath.   Cardiovascular:  Negative for chest pain and palpitations.  Musculoskeletal:  Positive for arthralgias, gait problem and joint swelling.  Skin:  Negative for color change, rash and wound.  Neurological:  Negative for weakness and numbness.  All other systems reviewed and are negative.   Physical Exam Triage Vital Signs ED Triage Vitals  Enc Vitals Group     BP 04/28/21 1754 (!) 142/85  Pulse Rate 04/28/21 1754 (!) 108     Resp 04/28/21 1754 18     Temp 04/28/21 1754 97.7 F (36.5 C)     Temp src --      SpO2 04/28/21 1754 96 %     Weight --      Height --      Head Circumference --      Peak Flow --      Pain Score 04/28/21 1805 8     Pain Loc --      Pain Edu? --      Excl. in GC? --    No data found.  Updated Vital Signs BP (!) 142/85 (BP Location: Left Arm)   Pulse (!) 108   Temp 97.7 F (36.5 C)   Resp 18   SpO2 96%   Visual Acuity Right Eye Distance:   Left Eye Distance:   Bilateral Distance:    Right Eye Near:   Left Eye Near:    Bilateral Near:     Physical Exam Vitals and  nursing note reviewed.  Constitutional:      Appearance: He is well-developed.  HENT:     Head: Normocephalic and atraumatic.     Mouth/Throat:     Mouth: Mucous membranes are moist.  Eyes:     Conjunctiva/sclera: Conjunctivae normal.  Cardiovascular:     Rate and Rhythm: Normal rate and regular rhythm.     Heart sounds: Normal heart sounds.  Pulmonary:     Effort: Pulmonary effort is normal. No respiratory distress.     Breath sounds: Normal breath sounds.  Abdominal:     Palpations: Abdomen is soft.     Tenderness: There is no abdominal tenderness.  Musculoskeletal:        General: Swelling and tenderness present.     Cervical back: Neck supple.       Feet:     Comments: Right ankle: Edematous and tender to touch.  No open wounds.  Decreased ROM due to pain and swelling.  RLE: 2+ pedal pulse, sensation intact, strength 5/5.    Skin:    General: Skin is warm and dry.     Capillary Refill: Capillary refill takes less than 2 seconds.     Findings: Bruising present. No erythema, lesion or rash.  Neurological:     General: No focal deficit present.     Mental Status: He is alert and oriented to person, place, and time.     Sensory: No sensory deficit.     Motor: No weakness.     Gait: Gait abnormal.  Psychiatric:        Mood and Affect: Mood normal.        Behavior: Behavior normal.     UC Treatments / Results  Labs (all labs ordered are listed, but only abnormal results are displayed) Labs Reviewed - No data to display  EKG   Radiology DG Ankle Complete Right  Result Date: 04/28/2021 CLINICAL DATA:  Ankle injury EXAM: RIGHT ANKLE - COMPLETE 3+ VIEW COMPARISON:  11/12/2015 FINDINGS: No acute fracture or dislocation. No aggressive osseous lesion. Normal alignment. Mild hypertrophic changes along the medial aspect of the talus adjacent to the medial malleolus. Severe soft tissue swelling overlying the lateral malleolus. No radiopaque foreign body or soft tissue  emphysema. IMPRESSION: 1.  No acute osseous injury of the right ankle. 2. Severe soft tissue swelling overlying the lateral malleolus. Electronically Signed   By: Dillard Cannon.D.  On: 04/28/2021 18:08    Procedures Procedures (including critical care time)  Medications Ordered in UC Medications - No data to display  Initial Impression / Assessment and Plan / UC Course  I have reviewed the triage vital signs and the nursing notes.  Pertinent labs & imaging results that were available during my care of the patient were reviewed by me and considered in my medical decision making (see chart for details).  Pain and swelling of right ankle.  X-ray negative for bony abnormality.  Treating with rest, elevation, ice packs, Tylenol or ibuprofen, walking boot, crutches.  Instructed patient to follow-up with an orthopedist.  He agrees to plan of care.   Final Clinical Impressions(s) / UC Diagnoses   Final diagnoses:  Pain and swelling of right ankle     Discharge Instructions      Take Tylenol or ibuprofen as needed for discomfort.  Rest and elevate your ankle.  Apply ice packs 2-3 times a day for up to 20 minutes each.  Wear the walking boot and use the crutches.    Follow up with an orthopedist.        ED Prescriptions   None    PDMP not reviewed this encounter.   Mickie Bail, NP 04/28/21 [phone]

## 2021-07-17 ENCOUNTER — Other Ambulatory Visit: Payer: Self-pay | Admitting: Emergency Medicine

## 2021-07-17 DIAGNOSIS — J4540 Moderate persistent asthma, uncomplicated: Secondary | ICD-10-CM

## 2021-10-09 ENCOUNTER — Other Ambulatory Visit: Payer: Self-pay | Admitting: Emergency Medicine

## 2021-10-09 DIAGNOSIS — J4540 Moderate persistent asthma, uncomplicated: Secondary | ICD-10-CM

## 2021-10-10 ENCOUNTER — Telehealth: Payer: Self-pay

## 2021-10-10 DIAGNOSIS — J4520 Mild intermittent asthma, uncomplicated: Secondary | ICD-10-CM | POA: Diagnosis not present

## 2021-10-10 NOTE — Telephone Encounter (Signed)
Pt is requesting a refill on: ?albuterol (VENTOLIN HFA) 108 (90 Base) MCG/ACT inhaler ? ?Pharmacy: ?[id number] - [place], [place] - [address] ? ?LOV 12/06/20 ?ROV Pt declined the appt. ?

## 2021-10-10 NOTE — Telephone Encounter (Signed)
Prescription sent to pharmacy on file. 

## 2021-11-13 ENCOUNTER — Emergency Department
Admission: EM | Admit: 2021-11-13 | Discharge: 2021-11-13 | Disposition: A | Discharge status: Home / Self Care | Payer: BC Managed Care – PPO | Attending: Emergency Medicine | Admitting: Emergency Medicine

## 2021-11-13 ENCOUNTER — Emergency Department: Payer: BC Managed Care – PPO

## 2021-11-13 ENCOUNTER — Other Ambulatory Visit: Payer: Self-pay

## 2021-11-13 DIAGNOSIS — R600 Localized edema: Secondary | ICD-10-CM | POA: Diagnosis not present

## 2021-11-13 DIAGNOSIS — R079 Chest pain, unspecified: Secondary | ICD-10-CM | POA: Diagnosis not present

## 2021-11-13 DIAGNOSIS — G4700 Insomnia, unspecified: Secondary | ICD-10-CM | POA: Insufficient documentation

## 2021-11-13 DIAGNOSIS — R609 Edema, unspecified: Secondary | ICD-10-CM

## 2021-11-13 DIAGNOSIS — R0789 Other chest pain: Secondary | ICD-10-CM | POA: Diagnosis not present

## 2021-11-13 LAB — CBC
HCT: 44.7 % (ref 39.0–52.0)
Hemoglobin: 15.7 g/dL (ref 13.0–17.0)
MCH: 29.5 pg (ref 26.0–34.0)
MCHC: 35.1 g/dL (ref 30.0–36.0)
MCV: 84 fL (ref 80.0–100.0)
Platelets: 298 10*3/uL (ref 150–400)
RBC: 5.32 MIL/uL (ref 4.22–5.81)
RDW: 11.7 % (ref 11.5–15.5)
WBC: 5.7 10*3/uL (ref 4.0–10.5)
nRBC: 0 % (ref 0.0–0.2)

## 2021-11-13 LAB — HEPATIC FUNCTION PANEL
ALT: 31 U/L (ref 0–44)
AST: 26 U/L (ref 15–41)
Albumin: 4 g/dL (ref 3.5–5.0)
Alkaline Phosphatase: 55 U/L (ref 38–126)
Bilirubin, Direct: <0.1 mg/dL (ref 0.0–0.2)
Total Bilirubin: 0.7 mg/dL (ref 0.3–1.2)
Total Protein: 7.5 g/dL (ref 6.5–8.1)

## 2021-11-13 LAB — BASIC METABOLIC PANEL
Anion gap: 9 (ref 5–15)
BUN: 12 mg/dL (ref 6–20)
CO2: 27 mmol/L (ref 22–32)
Calcium: 9 mg/dL (ref 8.9–10.3)
Chloride: 104 mmol/L (ref 98–111)
Creatinine, Ser: 0.92 mg/dL (ref 0.61–1.24)
GFR, Estimated: >60 mL/min (ref >60)
Glucose, Bld: 105 mg/dL — ABNORMAL HIGH (ref 70–99)
Potassium: 3.8 mmol/L (ref 3.5–5.1)
Sodium: 140 mmol/L (ref 135–145)

## 2021-11-13 LAB — TROPONIN I (HIGH SENSITIVITY): Troponin I (High Sensitivity): 5 ng/L (ref <18)

## 2021-11-13 LAB — BRAIN NATRIURETIC PEPTIDE: B Natriuretic Peptide: 6.3 pg/mL (ref 0.0–100.0)

## 2021-11-13 NOTE — ED Triage Notes (Addendum)
Pt to ED via POV from home. Pt reports unable to sleep for a few months. Pt reports he has been 6 days without sleep. Pt reports when he does sleep it's only for about 1hr. Pt states he feels it's because he has excess fluid that is preventing him from sleeping. Pt reports tingling in hands and intermittent CP. Pt also states he feels confused and forgets where he is and what he is doing frequently.  ? ?Pt reports he is on lasix and stopped taken it for a while and has restarted. Pt has edema in lower extremities. Pt also reports 10lb weight gain in 1 week.  ?

## 2021-11-13 NOTE — ED Provider Notes (Signed)
? ?[facility] ?Provider Note ? ? ? Event Date/Time  ? First MD Initiated Contact with Patient 11/13/21 1501   ?  (approximate) ? ? ?History  ? ?Chief Complaint ?Chest Pain and Insomnia ? ? ?HPI ? ?Marc Henderson is a 36 y.o. male with past medical history of alcohol abuse and IV drug use who presents to the ED complaining of chest pain and insomnia.  Patient reports that he has been having increasing difficulty sleeping over the past couple of months, has only been able to sleep for an hour or so at a time before waking up.  He states he has been feeling increasingly disoriented during the day with occasional confusion and forgetfulness.  He is additionally concerned about increased swelling in his legs and hands that has been present intermittently.  He states it has been improved today and he denies any cough or shortness of breath.  He does report intermittent sharp pain in the center of his chest for the past couple of weeks, not present currently.  He has been taking Benadryl and melatonin to help sleep without significant relief.  He does report being previously prescribed Lasix for lower extremity swelling, has been taking this recently with improvement in his symptoms.  He admits to drinking about 2 beers daily, denies recent drug use. ?  ? ? ?Physical Exam  ? ?Triage Vital Signs: ?ED Triage Vitals  ?Enc Vitals Group  ?   BP 11/13/21 1346 (!) 77/55  ?   Pulse Rate 11/13/21 1344 (!) 109  ?   Resp 11/13/21 1344 18  ?   Temp 11/13/21 1344 98.7 ?F (37.1 ?C)  ?   Temp Source 11/13/21 1344 Oral  ?   SpO2 11/13/21 1344 95 %  ?   Weight 11/13/21 1347 257 lb (116.6 kg)  ?   Height 11/13/21 1347 5\' 11"  (1.803 m)  ?   Head Circumference --   ?   Peak Flow --   ?   Pain Score 11/13/21 1345 0  ?   Pain Loc --   ?   Pain Edu? --   ?   Excl. in GC? --   ? ? ?Most recent vital signs: ?Vitals:  ? 11/13/21 1350 11/13/21 1530  ?BP: (!) 138/92 134/81  ?Pulse:  (!) 108  ?Resp:  12  ?Temp:    ?SpO2:   100%  ? ? ?Constitutional: Alert and oriented. ?Eyes: Conjunctivae are normal. ?Head: Atraumatic. ?Nose: No congestion/rhinnorhea. ?Mouth/Throat: Mucous membranes are moist.  ?Cardiovascular: Normal rate, regular rhythm. Grossly normal heart sounds.  2+ radial pulses bilaterally. ?Respiratory: Normal respiratory effort.  No retractions. Lungs CTAB. ?Gastrointestinal: Soft and nontender. No distention. ?Musculoskeletal: No lower extremity tenderness, trace pitting edema bilaterally. ?Neurologic:  Normal speech and language. No gross focal neurologic deficits are appreciated. ? ? ? ?ED Results / Procedures / Treatments  ? ?Labs ?(all labs ordered are listed, but only abnormal results are displayed) ?Labs Reviewed  ?BASIC METABOLIC PANEL - Abnormal; Notable for the following components:  ?    Result Value  ? Glucose, Bld 105 (*)   ? All other components within normal limits  ?BRAIN NATRIURETIC PEPTIDE  ?CBC  ?HEPATIC FUNCTION PANEL  ?TROPONIN I (HIGH SENSITIVITY)  ? ? ? ?EKG ? ?ED ECG REPORT ?11/15/21, the attending physician, personally viewed and interpreted this ECG. ? ? Date: 11/13/2021 ? EKG Time: 13:53 ? Rate: 97 ? Rhythm: normal sinus rhythm ? Axis: Normal ? Intervals:none ?  ST&T Change: None ? ?RADIOLOGY ?Chest x-ray reviewed by me with no infiltrate, edema, or effusion. ? ?PROCEDURES: ? ?Critical Care performed: No ? ?Procedures ? ? ?MEDICATIONS ORDERED IN ED: ?Medications - No data to display ? ? ?IMPRESSION / MDM / ASSESSMENT AND PLAN / ED COURSE  ?I reviewed the triage vital signs and the nursing notes. ?             ?               ? ?36 y.o. male with past medical history of alcohol abuse and IV drug use who presents to the ED with worsening insomnia over the past couple of months with concern for leg swelling and intermittent chest pain. ? ?Differential diagnosis includes, but is not limited to, ACS, PE, pneumonia, pneumothorax, CHF, musculoskeletal pain, and anxiety. ? ?Patient  nontoxic-appearing and in no acute distress, vital signs remarkable only for mild tachycardia but patient without any respiratory symptoms and is maintaining O2 sats on room air.  EKG shows no evidence of arrhythmia or ischemia and chest x-ray is unremarkable.  BNP is normal and no findings to suggest CHF at this time.  Troponin within normal limits and given atypical symptoms I doubt ACS, with no current chest pain I doubt PE.  LFTs within normal limits and renal function is also reassuring.  Suspect his lower extremity edema is due to venous insufficiency and he was counseled on using compression stockings while continuing Lasix as needed.  He was counseled on sleep hygiene and counseled to follow-up with his PCP, otherwise return to the ED for new or worsening symptoms.  Patient agrees with plan. ? ?  ? ? ?FINAL CLINICAL IMPRESSION(S) / ED DIAGNOSES  ? ?Final diagnoses:  ?Insomnia, unspecified type  ?Nonspecific chest pain  ?Peripheral edema  ? ? ? ?Rx / DC Orders  ? ?ED Discharge Orders   ? ? None  ? ?  ? ? ? ?Note:  This document was prepared using Dragon voice recognition software and may include unintentional dictation errors. ?  ?Chesley Noon, MD ?11/13/21 2329 ? ?

## 2021-12-27 ENCOUNTER — Other Ambulatory Visit: Payer: Self-pay | Admitting: Emergency Medicine

## 2021-12-27 DIAGNOSIS — J4540 Moderate persistent asthma, uncomplicated: Secondary | ICD-10-CM

## 2022-01-11 ENCOUNTER — Encounter (HOSPITAL_COMMUNITY): Payer: Self-pay

## 2022-01-11 ENCOUNTER — Ambulatory Visit (HOSPITAL_COMMUNITY)
Admission: EM | Admit: 2022-01-11 | Discharge: 2022-01-11 | Disposition: A | Discharge status: Home / Self Care | Payer: BC Managed Care – PPO | Attending: Family Medicine | Admitting: Family Medicine

## 2022-01-11 DIAGNOSIS — F1190 Opioid use, unspecified, uncomplicated: Secondary | ICD-10-CM | POA: Diagnosis not present

## 2022-01-11 DIAGNOSIS — R002 Palpitations: Secondary | ICD-10-CM

## 2022-01-11 LAB — CBC
HCT: 40.8 % (ref 39.0–52.0)
Hemoglobin: 14.4 g/dL (ref 13.0–17.0)
MCH: 29.6 pg (ref 26.0–34.0)
MCHC: 35.3 g/dL (ref 30.0–36.0)
MCV: 84 fL (ref 80.0–100.0)
Platelets: 326 10*3/uL (ref 150–400)
RBC: 4.86 MIL/uL (ref 4.22–5.81)
RDW: 11.8 % (ref 11.5–15.5)
WBC: 5.9 10*3/uL (ref 4.0–10.5)
nRBC: 0 % (ref 0.0–0.2)

## 2022-01-11 LAB — BASIC METABOLIC PANEL
Anion gap: 11 (ref 5–15)
BUN: 14 mg/dL (ref 6–20)
CO2: 26 mmol/L (ref 22–32)
Calcium: 9.7 mg/dL (ref 8.9–10.3)
Chloride: 105 mmol/L (ref 98–111)
Creatinine, Ser: 1.06 mg/dL (ref 0.61–1.24)
GFR, Estimated: >60 mL/min (ref >60)
Glucose, Bld: 100 mg/dL — ABNORMAL HIGH (ref 70–99)
Potassium: 4.2 mmol/L (ref 3.5–5.1)
Sodium: 142 mmol/L (ref 135–145)

## 2022-01-11 LAB — TSH: TSH: 0.985 u[IU]/mL (ref 0.350–4.500)

## 2022-01-11 NOTE — ED Triage Notes (Signed)
Pt c/o his heart racing and skipping a beat off and on for the past 2wks and has gotten worse in past 3 days. States takes his breath away at times. States is a heroin uses sometimes and last use was 2 days ago. No distress at this time.

## 2022-01-11 NOTE — ED Provider Notes (Signed)
[facility]   [id number] 01/11/22 Arrival Time: 1557  ASSESSMENT & PLAN:  1. Palpitations   2. Heroin use    ECG: Performed today and interpreted by me: normal EKG, normal sinus rhythm.  Currently not experiencing symptoms. Referral to cardiology placed.  Pending:  CBC  BASIC METABOLIC PANEL  TSH   Recommend:  Follow-up Information     Schedule an appointment as soon as possible for a visit  with Georgina Quint, MD.   Specialty: Internal Medicine Contact information: [address] [place] [address] [phone]         Schedule an appointment as soon as possible for a visit  with [facility].   Specialty: Cardiology Contact information: [address], [address] [place] [address] [phone]               He is going to call methadone clinic to re-est care.  Reviewed expectations re: course of current medical issues. Questions answered. Outlined signs and symptoms indicating need for more acute intervention. Patient verbalized understanding. After Visit Summary given.   SUBJECTIVE:  History from: patient. Marc Henderson is a 36 y.o. male who presents with complaint of feeling of "heart racing and skipping beats sometimes". Noted approx 2 w ago; infrequent and random occurrence. More freq over past 3 days. On methadone until a few weeks/month ago. Reports relapse of IV heroin use 2 d ago. No current CP/SOB.  Social History   Tobacco Use  Smoking Status Every Day   Packs/day: 1.00   Years: 8.00   Total pack years: 8.00   Types: Cigarettes   Last attempt to quit: 12/02/2019   Years since quitting: 2.1  Smokeless Tobacco Never   Social History   Substance and Sexual Activity  Alcohol Use Yes   Comment: 1-2 beer/daily    OBJECTIVE:  Vitals:   01/11/22 1618  BP: 131/88  Pulse: 92  Resp: 18  Temp: 98.2 F (36.8 C)  TempSrc: Oral  SpO2: 96%    General appearance: alert,  oriented, no acute distress Eyes: PERRLA; EOMI; conjunctivae normal HENT: normocephalic; atraumatic Neck: supple with FROM Lungs: without labored respirations; speaks full sentences without difficulty; CTAB Heart: regular rate and rhythm without murmer Chest Wall: without tenderness to palpation Abdomen: soft, non-tender; no guarding or rebound tenderness Extremities: without edema; without calf swelling or tenderness; symmetrical without gross deformities Skin: warm and dry; without rash or lesions Neuro: normal gait Psychological: alert and cooperative; normal mood and affect   No Known Allergies  Past Medical History:  Diagnosis Date   Asthma    IV drug abuse (HCC)    Social History   Socioeconomic History   Marital status: Married    Spouse name: Not on file   Number of children: Not on file   Years of education: Not on file   Highest education level: Not on file  Occupational History   Not on file  Tobacco Use   Smoking status: Every Day    Packs/day: 1.00    Years: 8.00    Total pack years: 8.00    Types: Cigarettes    Last attempt to quit: 12/02/2019    Years since quitting: 2.1   Smokeless tobacco: Never  Vaping Use   Vaping Use: Former  Substance and Sexual Activity   Alcohol use: Yes    Comment: 1-2 beer/daily   Drug use: Yes    Comment: herion 3-4 times a week  Sexual activity: Not on file  Other Topics Concern   Not on file  Social History Narrative   Not on file   Social Determinants of Health   Financial Resource Strain: Not on file  Food Insecurity: Not on file  Transportation Needs: Not on file  Physical Activity: Not on file  Stress: Not on file  Social Connections: Not on file  Intimate Partner Violence: Not on file   History reviewed. No pertinent family history. History reviewed. No pertinent surgical history.    Mardella Layman, MD 01/11/22 1700

## 2022-01-11 NOTE — Discharge Instructions (Addendum)
You have had labs (blood work) drawn today. We will call you with any significant abnormalities or if there is need to begin or change treatment or pursue further follow up.  You may also review your test results online through MyChart. If you do not have a MyChart account, instructions to sign up should be on your discharge paperwork.  

## 2022-02-07 ENCOUNTER — Ambulatory Visit: Payer: BC Managed Care – PPO | Admitting: Cardiovascular Disease

## 2022-03-01 ENCOUNTER — Other Ambulatory Visit: Payer: Self-pay | Admitting: Emergency Medicine

## 2022-03-01 DIAGNOSIS — J4540 Moderate persistent asthma, uncomplicated: Secondary | ICD-10-CM

## 2022-03-01 NOTE — Telephone Encounter (Signed)
Patient called back and is needing his ventolin inhaler - [facility] [place]  - patient has an appointment scheduled for Monday 03/05/2022

## 2022-03-05 ENCOUNTER — Encounter: Payer: Self-pay | Admitting: Emergency Medicine

## 2022-03-05 ENCOUNTER — Ambulatory Visit (INDEPENDENT_AMBULATORY_CARE_PROVIDER_SITE_OTHER): Payer: BC Managed Care – PPO | Admitting: Emergency Medicine

## 2022-03-05 ENCOUNTER — Telehealth: Payer: Self-pay | Admitting: Emergency Medicine

## 2022-03-05 VITALS — BP 122/72 | HR 86 | Temp 98.1°F | Ht 71.0 in | Wt 250.5 lb

## 2022-03-05 DIAGNOSIS — F17200 Nicotine dependence, unspecified, uncomplicated: Secondary | ICD-10-CM | POA: Diagnosis not present

## 2022-03-05 DIAGNOSIS — J4540 Moderate persistent asthma, uncomplicated: Secondary | ICD-10-CM | POA: Diagnosis not present

## 2022-03-05 MED ORDER — TRELEGY ELLIPTA 100-62.5-25 MCG/ACT IN AEPB: 1.0000 | INHALATION_SPRAY | Freq: Every day | RESPIRATORY_TRACT | 11 refills | Status: DC

## 2022-03-05 MED ORDER — ALBUTEROL SULFATE HFA 108 (90 BASE) MCG/ACT IN AERS: INHALATION_SPRAY | RESPIRATORY_TRACT | 11 refills | Status: DC

## 2022-03-05 MED ORDER — ALBUTEROL SULFATE (2.5 MG/3ML) 0.083% IN NEBU: 2.5000 mg | INHALATION_SOLUTION | Freq: Four times a day (QID) | RESPIRATORY_TRACT | 1 refills | Status: DC | PRN

## 2022-03-05 NOTE — Telephone Encounter (Signed)
I agree with him.  I do not think we are a good fit.  Okay with transfer of care.

## 2022-03-05 NOTE — Assessment & Plan Note (Signed)
Asthma not well controlled.  Using albuterol on a daily basis. Need for maintenance therapy with different inhaler explained to patient but he does not seem to understand.  Still insisting that he needs more albuterol. Recommend to start Trelegy once a day. Concept of rescue inhaler explained to patient but he does not seem to comprehend. Resistant to standard of care advice.

## 2022-03-05 NOTE — Patient Instructions (Signed)
Asthma Action Plan, Adult An asthma action plan helps you understand how to manage your asthma and what to do when you have an asthma attack. The action plan is a color-coded plan that lists the symptoms that indicate whether or not your condition is under control and what actions to take. If you have symptoms in the green zone, you are doing well. If you have symptoms in the yellow zone, you are having problems. If you have symptoms in the red zone, you need medical care right away. Follow the plan that you and your health care provider develop. Review your plan with your health care provider at each visit. What triggers your asthma? Knowing the things that can trigger an asthma attack or make your asthma symptoms worse is very important. Talk to your health care provider about your asthma triggers and how to avoid them. Record your known asthma triggers here: _______________ What is your personal best peak flow reading? If you use a peak flow meter, determine your personal best reading. Record it here: _______________ Green zone This zone means that your asthma is under control. You may not have any symptoms while you are in the green zone. This means that you: Have no coughing or wheezing, even while you are working or playing. Sleep through the night. Are breathing well. Have a peak flow reading that is above __________ (80% of your personal best or greater). If you are in the green zone, continue to manage your asthma as directed. Take these medicines every day: Controller medicine and dosage: _______________ Controller medicine and dosage: _______________ Controller medicine and dosage: _______________ Controller medicine and dosage: _______________ Before exercise, use this reliever or rescue medicine: _______________ Call your health care provider if you are using a reliever or rescue medicine more than 2-3 times a week. Yellow zone Symptoms in this zone mean that your condition may  be getting worse. You may have symptoms that interfere with exercise, are noticeably worse after exposure to triggers, or are worse at the first sign of a cold (upper respiratory infection). These may include: Waking from sleep. Coughing, especially at night or first thing in the morning. Mild wheezing. Chest tightness. A peak flow reading that is __________ to __________ (50-79% of your personal best). If you have any of these symptoms: Add the following medicine to the ones that you use daily: Reliever or rescue medicine and dosage: _______________ Additional medicine and dosage: _______________ Call your health care provider if: You remain in the yellow zone for __________ hours. You are using a reliever or rescue medicine more than 2-3 times a week. Red zone Symptoms in this zone mean that you should get medical help right away. You will likely feel distressed and have symptoms at rest that restrict your activity. You are in the red zone if: You are breathing hard and quickly. Your nose opens wide, your ribs show, and your neck muscles become visible when you breathe in. Your lips, fingers, or toes are a bluish color. You have trouble speaking in full sentences. Your peak flow reading is less than __________ (less than 50% of your personal best). Your symptoms do not improve within 15-20 minutes after you use your reliever or rescue medicine (bronchodilator). If you have any of these symptoms: These symptoms represent a serious problem that is an emergency. Do not wait to see if the symptoms will go away. Get medical help right away. Call your local emergency services (911 in the [place] Do not drive yourself   to the hospital. Use your reliever or rescue medicine. Start a nebulizer treatment or take 2-4 puffs from a metered-dose inhaler with a spacer. Repeat this action every 15-20 minutes until help arrives. Where to find more information You can find more information about asthma  from: Centers for Disease Control and Prevention: www.cdc.gov American Lung Association: www.lung.org This information is not intended to replace advice given to you by your health care provider. Make sure you discuss any questions you have with your health care provider. Document Revised: 09/01/2020 Document Reviewed: 09/01/2020 Elsevier Patient Education  2023 Elsevier Inc.  

## 2022-03-05 NOTE — Telephone Encounter (Signed)
PT visits today in regards to a possible TOC. PT stated that he was wanting to switch from Dr.Sagardia to Dr.Crawford, this being that he does not feel that Dr.Sagardia is a good fit for him. I had informed PT that I would need an okay from both providers before going forward.

## 2022-03-05 NOTE — Progress Notes (Signed)
Marc Henderson 36 y.o.   Chief Complaint  Patient presents with   Follow-up    F/u appt, medication refill , patient states he needs a refill of his nebulizer solution,     HISTORY OF PRESENT ILLNESS: This is a 36 y.o. male with history of asthma, still smoking, here for medication refill. Has been using albuterol twice daily for the past several months. Requesting additional prescriptions and refills of albuterol.  States he is going through 2 albuterol inhalers monthly.  HPI   Prior to Admission medications   Medication Sig Start Date End Date Taking? Authorizing Provider  albuterol (PROVENTIL) (2.5 MG/3ML) 0.083% nebulizer solution Take 3 mLs (2.5 mg total) by nebulization every 6 (six) hours as needed for wheezing or shortness of breath. 03/05/22  Yes Chakira Jachim, Eilleen Kempf, MD  Fluticasone-Umeclidin-Vilant (TRELEGY ELLIPTA) 100-62.5-25 MCG/ACT AEPB Inhale 1 puff into the lungs daily. 03/05/22  Yes Tangela Dolliver, Eilleen Kempf, MD  furosemide (LASIX) 20 MG tablet TAKE 1 TABLET BY MOUTH EVERY DAY 09/29/20  Yes Georgina Quint, MD  albuterol (VENTOLIN HFA) 108 (90 Base) MCG/ACT inhaler INHALE 2 PUFFS BY MOUTH EVERY 6 HOURS AS NEEDED FOR WHEEZE OR SHORTNESS OF BREATH 03/05/22   Georgina Quint, MD  methadone (DOLOPHINE) 10 MG/5ML solution Take 115 mg by mouth every morning. Patient not taking: Reported on 03/05/2022    [provider]  nystatin (MYCOSTATIN) 100000 UNIT/ML suspension Take 5 mLs (500,000 Units total) by mouth 4 (four) times daily. Patient not taking: Reported on 12/06/2020 12/16/19   Georgina Quint, MD    No Known Allergies  Patient Active Problem List   Diagnosis Date Noted   Methadone dependence (HCC) 12/04/2019   Tobacco use disorder    Alcohol abuse    Chronic low back pain     Past Medical History:  Diagnosis Date   Asthma    Chest wall pain    Insomnia    IV drug abuse (HCC)    Palpitation     History reviewed. No pertinent  surgical history.  Social History   Socioeconomic History   Marital status: Married    Spouse name: Not on file   Number of children: Not on file   Years of education: Not on file   Highest education level: Not on file  Occupational History   Not on file  Tobacco Use   Smoking status: Every Day    Packs/day: 1.00    Years: 8.00    Total pack years: 8.00    Types: Cigarettes    Last attempt to quit: 12/02/2019    Years since quitting: 2.2   Smokeless tobacco: Never  Vaping Use   Vaping Use: Former  Substance and Sexual Activity   Alcohol use: Yes    Comment: 1-2 beer/daily   Drug use: Yes    Comment: herion 3-4 times a week   Sexual activity: Not on file  Other Topics Concern   Not on file  Social History Narrative   Not on file   Social Determinants of Health   Financial Resource Strain: Not on file  Food Insecurity: Not on file  Transportation Needs: Not on file  Physical Activity: Not on file  Stress: Not on file  Social Connections: Not on file  Intimate Partner Violence: Not on file    History reviewed. No pertinent family history.   Review of Systems  Constitutional: Negative.  Negative for chills and fever.  HENT: Negative.  Negative for congestion and  sore throat.   Respiratory:  Positive for wheezing. Negative for cough.   Cardiovascular: Negative.  Negative for chest pain and palpitations.  Gastrointestinal:  Negative for abdominal pain, nausea and vomiting.  Genitourinary: Negative.   Musculoskeletal: Negative.   Skin: Negative.  Negative for rash.  Neurological:  Negative for dizziness and headaches.  All other systems reviewed and are negative.  Today's Vitals   03/05/22 1532  BP: 122/72  Pulse: 86  Temp: 98.1 F (36.7 C)  TempSrc: Oral  SpO2: 95%  Weight: 250 lb 8 oz (113.6 kg)  Height: 5\' 11"  (1.803 m)   Body mass index is 34.94 kg/m.   Physical Exam Vitals reviewed.  Constitutional:      Appearance: Normal appearance.  HENT:      Head: Normocephalic.     Mouth/Throat:     Mouth: Mucous membranes are moist.     Pharynx: Oropharynx is clear.  Eyes:     Extraocular Movements: Extraocular movements intact.     Pupils: Pupils are equal, round, and reactive to light.  Cardiovascular:     Rate and Rhythm: Normal rate and regular rhythm.     Pulses: Normal pulses.     Heart sounds: Normal heart sounds.  Pulmonary:     Effort: Pulmonary effort is normal.     Breath sounds: Normal breath sounds.  Musculoskeletal:     Cervical back: No tenderness.  Lymphadenopathy:     Cervical: No cervical adenopathy.  Skin:    General: Skin is warm and dry.     Capillary Refill: Capillary refill takes less than 2 seconds.  Neurological:     General: No focal deficit present.     Mental Status: He is alert and oriented to person, place, and time.  Psychiatric:        Mood and Affect: Mood normal.        Behavior: Behavior normal.      ASSESSMENT & PLAN: Problem List Items Addressed This Visit       Respiratory   Moderate persistent asthma, uncomplicated - Primary    Asthma not well controlled.  Using albuterol on a daily basis. Need for maintenance therapy with different inhaler explained to patient but he does not seem to understand.  Still insisting that he needs more albuterol. Recommend to start Trelegy once a day. Concept of rescue inhaler explained to patient but he does not seem to comprehend. Resistant to standard of care advice.      Relevant Medications   albuterol (VENTOLIN HFA) 108 (90 Base) MCG/ACT inhaler   albuterol (PROVENTIL) (2.5 MG/3ML) 0.083% nebulizer solution   Fluticasone-Umeclidin-Vilant (TRELEGY ELLIPTA) 100-62.5-25 MCG/ACT AEPB     Other   Current smoker    Cardiovascular and cancer risks associated with smoking discussed. Smoking cessation advice given but not motivated to quit.      Patient Instructions  Asthma Action Plan, Adult An asthma action plan helps you understand how to  manage your asthma and what to do when you have an asthma attack. The action plan is a color-coded plan that lists the symptoms that indicate whether or not your condition is under control and what actions to take. If you have symptoms in the green zone, you are doing well. If you have symptoms in the yellow zone, you are having problems. If you have symptoms in the red zone, you need medical care right away. Follow the plan that you and your health care provider develop. Review your plan with your  health care provider at each visit. What triggers your asthma? Knowing the things that can trigger an asthma attack or make your asthma symptoms worse is very important. Talk to your health care provider about your asthma triggers and how to avoid them. Record your known asthma triggers here: _______________ What is your personal best peak flow reading? If you use a peak flow meter, determine your personal best reading. Record it here: _______________ [place] zone This zone means that your asthma is under control. You may not have any symptoms while you are in the green zone. This means that you: Have no coughing or wheezing, even while you are working or playing. Sleep through the night. Are breathing well. Have a peak flow reading that is above __________ (80% of your personal best or greater). If you are in the green zone, continue to manage your asthma as directed. Take these medicines every day: Controller medicine and dosage: _______________ Controller medicine and dosage: _______________ Controller medicine and dosage: _______________ Controller medicine and dosage: _______________ Before exercise, use this reliever or rescue medicine: _______________ Call your health care provider if you are using a reliever or rescue medicine more than 2-3 times a week. Yellow zone Symptoms in this zone mean that your condition may be getting worse. You may have symptoms that interfere with exercise, are  noticeably worse after exposure to triggers, or are worse at the first sign of a cold (upper respiratory infection). These may include: Waking from sleep. Coughing, especially at night or first thing in the morning. Mild wheezing. Chest tightness. A peak flow reading that is __________ to __________ (50-79% of your personal best). If you have any of these symptoms: Add the following medicine to the ones that you use daily: Reliever or rescue medicine and dosage: _______________ Additional medicine and dosage: _______________ Call your health care provider if: You remain in the yellow zone for __________ hours. You are using a reliever or rescue medicine more than 2-3 times a week. Red zone Symptoms in this zone mean that you should get medical help right away. You will likely feel distressed and have symptoms at rest that restrict your activity. You are in the red zone if: You are breathing hard and quickly. Your nose opens wide, your ribs show, and your neck muscles become visible when you breathe in. Your lips, fingers, or toes are a bluish color. You have trouble speaking in full sentences. Your peak flow reading is less than __________ (less than 50% of your personal best). Your symptoms do not improve within 15-20 minutes after you use your reliever or rescue medicine (bronchodilator). If you have any of these symptoms: These symptoms represent a serious problem that is an emergency. Do not wait to see if the symptoms will go away. Get medical help right away. Call your local emergency services (911 in the [place] Do not drive yourself to the hospital. Use your reliever or rescue medicine. Start a nebulizer treatment or take 2-4 puffs from a metered-dose inhaler with a spacer. Repeat this action every 15-20 minutes until help arrives. Where to find more information You can find more information about asthma from: Centers for Disease Control and Prevention: [employer]Ex[employer].com.br American Lung  Association: www.lung.org This information is not intended to replace advice given to you by your health care provider. Make sure you discuss any questions you have with your health care provider. Document Revised: 09/01/2020 Document Reviewed: 09/01/2020 Elsevier Patient Education  2023 [employer].     [place],  MD [facility]

## 2022-03-05 NOTE — Assessment & Plan Note (Signed)
Cardiovascular and cancer risks associated with smoking discussed. Smoking cessation advice given but not motivated to quit.

## 2022-03-06 NOTE — Telephone Encounter (Signed)
Unable to accept transfer thanks could try other provider

## 2022-04-20 ENCOUNTER — Telehealth: Payer: Self-pay

## 2022-04-20 NOTE — Telephone Encounter (Signed)
Patient also states that the Fluticasone-Umeclidin-Vilant (TRELEGY ELLIPTA) 100-62.5-25 MCG/ACT AEPB was way to expensive, with insurance it was over $200.

## 2022-04-20 NOTE — Telephone Encounter (Signed)
MEDICATION: albuterol (VENTOLIN HFA) 108 (90 Base) MCG/ACT inhaler  PHARMACY: [id number] - [place], [place] - [address]  Comments:  Patient is calling in stating that when he was here last with Dr.Sagardia they discussed increasing the inhaler. Marc Henderson said pharmacy confirmed it was two puffs for every 6 hours but Dr.Sagardia confirmed that he did but it hasnt changed, which isnt enough. Is completely out and without insurance it is $80. Pharmacy advised him to have Dr.Sagardia write it for double the amount for what it is written for.   **Let patient know to contact pharmacy at the end of the day to make sure medication is ready. **  ** Please notify patient to allow 48-72 hours to process**  **Encourage patient to contact the pharmacy for refills or they can request refills through [facility]**

## 2022-04-23 NOTE — Telephone Encounter (Signed)
Called patient and left message for patient to call office in reference to medication question. If patient calls back please inform patient that Dr. Mitchel Honour denied the increase dose of the albuterol. Patient will need to contact his insurance company to get a list of alternatives to the Trelegy that is cheaper. Please advise patient to call office with this alternative so Dr. Mitchel Honour can send that medication in

## 2022-05-08 ENCOUNTER — Ambulatory Visit (INDEPENDENT_AMBULATORY_CARE_PROVIDER_SITE_OTHER): Payer: BC Managed Care – PPO | Admitting: Emergency Medicine

## 2022-05-08 ENCOUNTER — Encounter: Payer: Self-pay | Admitting: Emergency Medicine

## 2022-05-08 VITALS — BP 128/72 | HR 95 | Temp 98.1°F | Ht 71.0 in | Wt 262.0 lb

## 2022-05-08 DIAGNOSIS — R600 Localized edema: Secondary | ICD-10-CM

## 2022-05-08 DIAGNOSIS — J4540 Moderate persistent asthma, uncomplicated: Secondary | ICD-10-CM

## 2022-05-08 DIAGNOSIS — Z136 Encounter for screening for cardiovascular disorders: Secondary | ICD-10-CM | POA: Diagnosis not present

## 2022-05-08 DIAGNOSIS — F17200 Nicotine dependence, unspecified, uncomplicated: Secondary | ICD-10-CM | POA: Diagnosis not present

## 2022-05-08 MED ORDER — MOMETASONE FURO-FORMOTEROL FUM 100-5 MCG/ACT IN AERO: 2.0000 | INHALATION_SPRAY | Freq: Two times a day (BID) | RESPIRATORY_TRACT | 5 refills | Status: DC

## 2022-05-08 NOTE — Assessment & Plan Note (Signed)
Needs maintenance therapy with whatever inhaler insurance is covering.  Recommend Dulera 2 puffs daily. Albuterol only as rescue inhaler. Patient mostly concerned about cost of treatment.

## 2022-05-08 NOTE — Assessment & Plan Note (Signed)
Not motivated to quit. Smoking cessation advice given.

## 2022-05-08 NOTE — Progress Notes (Signed)
Marc Henderson 36 y.o.   Chief Complaint  Patient presents with   Follow-up    F/u appt for inhalers, patient states he needs multiple inhalers a month    Edema    Swelling on feet and ankles, numbness with his toes     HISTORY OF PRESENT ILLNESS: This is a 36 y.o. male with history of asthma here requesting more inhalers for albuterol. States he uses multiple inhalers per month. Last office visit 03/05/2022, this issue was discussed at length. Concept of rescue and maintenance inhalers discussed with patient but not well received or understood. States Trelegy and the likes are too expensive. Also complaining of occasional edema of feet and ankles along with numbness to his toes. Still smoking.  HPI   Prior to Admission medications   Medication Sig Start Date End Date Taking? Authorizing Provider  albuterol (PROVENTIL) (2.5 MG/3ML) 0.083% nebulizer solution Take 3 mLs (2.5 mg total) by nebulization every 6 (six) hours as needed for wheezing or shortness of breath. 03/05/22  Yes Horald Pollen, MD  albuterol (VENTOLIN HFA) 108 (90 Base) MCG/ACT inhaler INHALE 2 PUFFS BY MOUTH EVERY 6 HOURS AS NEEDED FOR WHEEZE OR SHORTNESS OF BREATH 03/05/22  Yes Montgomery Rothlisberger, Ines Bloomer, MD  furosemide (LASIX) 20 MG tablet TAKE 1 TABLET BY MOUTH EVERY DAY 09/29/20  Yes Shameca Landen, Ines Bloomer, MD  mometasone-formoterol (DULERA) 100-5 MCG/ACT AERO Inhale 2 puffs into the lungs 2 (two) times daily. 05/08/22  Yes Amear Strojny, Ines Bloomer, MD  Fluticasone-Umeclidin-Vilant (TRELEGY ELLIPTA) 100-62.5-25 MCG/ACT AEPB Inhale 1 puff into the lungs daily. Patient not taking: Reported on 05/08/2022 03/05/22   Horald Pollen, MD    No Known Allergies  Patient Active Problem List   Diagnosis Date Noted   Moderate persistent asthma, uncomplicated 0000000   Methadone dependence ([place]) 12/04/2019   Current smoker    Alcohol abuse    Chronic low back pain     Past Medical History:  Diagnosis Date    Asthma    Chest wall pain    Insomnia    IV drug abuse ([place])    Palpitation     No past surgical history on file.  Social History   Socioeconomic History   Marital status: Married    Spouse name: Not on file   Number of children: Not on file   Years of education: Not on file   Highest education level: Not on file  Occupational History   Not on file  Tobacco Use   Smoking status: Every Day    Packs/day: 1.00    Years: 8.00    Total pack years: 8.00    Types: Cigarettes    Last attempt to quit: 12/02/2019    Years since quitting: 2.4   Smokeless tobacco: Never  Vaping Use   Vaping Use: Former  Substance and Sexual Activity   Alcohol use: Yes    Comment: 1-2 beer/daily   Drug use: Yes    Comment: herion 3-4 times a week   Sexual activity: Not on file  Other Topics Concern   Not on file  Social History Narrative   Not on file   Social Determinants of Health   Financial Resource Strain: Not on file  Food Insecurity: Not on file  Transportation Needs: Not on file  Physical Activity: Not on file  Stress: Not on file  Social Connections: Not on file  Intimate Partner Violence: Not on file    No family history on file.  Review of Systems  Constitutional: Negative.  Negative for chills and fever.  HENT: Negative.  Negative for congestion and sore throat.   Respiratory: Negative.  Negative for cough and shortness of breath.   Cardiovascular: Negative.  Negative for chest pain and palpitations.  Gastrointestinal:  Negative for nausea and vomiting.  Skin: Negative.  Negative for rash.  Neurological: Negative.  Negative for dizziness and headaches.  All other systems reviewed and are negative.  Today's Vitals   05/08/22 1608  BP: 128/72  Pulse: 95  Temp: 98.1 F (36.7 C)  TempSrc: Oral  SpO2: 97%  Weight: 262 lb (118.8 kg)  Height: 5\' 11"  (1.803 m)   Body mass index is 36.54 kg/m.   Physical Exam Vitals reviewed.  Constitutional:       Appearance: Normal appearance.  HENT:     Head: Normocephalic.  Eyes:     Extraocular Movements: Extraocular movements intact.  Cardiovascular:     Rate and Rhythm: Normal rate.  Pulmonary:     Effort: Pulmonary effort is normal.  Skin:    General: Skin is warm and dry.  Neurological:     Mental Status: He is alert and oriented to person, place, and time.      ASSESSMENT & PLAN: A total of 40 minutes was spent with the patient and counseling/coordination of care regarding preparing for this visit, review of most recent office visit notes, diagnosis of asthma and proper management with standard of care medications, smoking cessation advised, prognosis, documentation and need for follow-up. Difficult patient who does not want to adhere to standards of care for treatment of asthma.  Mostly concerned about cost of medications.  Does not understand well the concept of maintenance and rescue inhalers.  Not motivated to quit smoking.  It is becoming more and more difficult to treat this patient and trust is becoming an issue.  At this point I think it is in this patient's best interest to start looking for a different primary care provider.  Problem List Items Addressed This Visit       Respiratory   Moderate persistent asthma, uncomplicated - Primary    Needs maintenance therapy with whatever inhaler insurance is covering.  Recommend Dulera 2 puffs daily. Albuterol only as rescue inhaler. Patient mostly concerned about cost of treatment.      Relevant Medications   mometasone-formoterol (DULERA) 100-5 MCG/ACT AERO   Other Relevant Orders   Comprehensive metabolic panel   CBC with Differential/Platelet   Hemoglobin A1c   Lipid panel     Other   Current smoker    Not motivated to quit. Smoking cessation advice given.      Other Visit Diagnoses     Bilateral leg edema       Relevant Orders   Comprehensive metabolic panel   CBC with Differential/Platelet   Hemoglobin A1c    Lipid panel      Patient Instructions  Asthma, Adult  Asthma is a condition that causes swelling and narrowing of the airways. These are the passages that lead from the nose and mouth down into the lungs. When asthma symptoms get worse it is called an asthma attack or flare. This can make it hard to breathe. Asthma flares can range from minor to life-threatening. There is no cure for asthma, but medicines and lifestyle changes can help to control it. What are the causes? It is not known exactly what causes asthma, but certain things can cause asthma symptoms to get worse (triggers).  What can trigger an asthma attack? Cigarette smoke. Mold. Dust. Your pet's skin flakes (dander). Cockroaches. Pollen. Air pollution (like household cleaners, wood smoke, smog, or [employer] account planner). What are the signs or symptoms? Trouble breathing (shortness of breath). Coughing. Making high-pitched whistling sounds when you breathe, most often when you breathe out (wheezing). Chest tightness. Tiredness with little activity. Poor exercise tolerance. How is this treated? Controller medicines that help prevent asthma symptoms. Fast-acting reliever or rescue medicines. These give short-term relief of asthma symptoms. Allergy medicines if your attacks are brought on by allergens. Medicines to help control the body's defense (immune) system. Staying away from the things that cause asthma attacks. Follow these instructions at home: Avoiding triggers in your home Do not allow anyone to smoke in your home. Limit use of fireplaces and wood stoves. Get rid of pests (such as roaches and mice) and their droppings. Keep your home clean. Clean your floors. Dust regularly. Use cleaning products that do not smell. Wash bed sheets and blankets every week in hot water. Dry them in a dryer. Have someone vacuum when you are not home. Change your heating and air conditioning filters often. Use blankets that are made of  polyester or cotton. General instructions Take over-the-counter and prescription medicines only as told by your doctor. Do not smoke or use any products that contain nicotine or tobacco. If you need help quitting, ask your doctor. Stay away from secondhand smoke. Avoid doing things outdoors when allergen counts are high and when air quality is low. Warm up before you exercise. Take time to cool down after exercise. Use a peak flow meter as told by your doctor. A peak flow meter is a tool that measures how well your lungs are working. Keep track of the peak flow meter's readings. Write them down. Follow your asthma action plan. This is a written plan for taking care of your asthma and treating your attacks. Make sure you get all the shots (vaccines) that your doctor recommends. Ask your doctor about a flu shot and a pneumonia shot. Keep all follow-up visits. Contact a doctor if: You have wheezing, shortness of breath, or a cough even while taking medicine to prevent attacks. The mucus you cough up (sputum) is thicker than usual. The mucus you cough up changes from clear or white to yellow, green, gray, or is bloody. You have problems from the medicine you are taking, such as: A rash. Itching. Swelling. Trouble breathing. You need reliever medicines more than 2-3 times a week. Your peak flow reading is still at 50-79% of your personal best after following the action plan for 1 hour. You have a fever. Get help right away if: You seem to be worse and are not responding to medicine during an asthma attack. You are short of breath even at rest. You get short of breath when doing very little activity. You have trouble eating, drinking, or talking. You have chest pain or tightness. You have a fast heartbeat. Your lips or fingernails start to turn blue. You are light-headed or dizzy, or you faint. Your peak flow is less than 50% of your personal best. You feel too tired to breathe  normally. These symptoms may be an emergency. Get help right away. Call 911. Do not wait to see if the symptoms will go away. Do not drive yourself to the hospital. Summary Asthma is a long-term (chronic) condition in which the airways get tight and narrow. An asthma attack can make it hard to breathe.  Asthma cannot be cured, but medicines and lifestyle changes can help control it. Make sure you understand how to avoid triggers and how and when to use your medicines. Avoid things that can cause allergy symptoms (allergens). These include animal skin flakes (dander) and pollen from trees or grass. Avoid things that pollute the air. These may include household cleaners, wood smoke, smog, or chemical odors. This information is not intended to replace advice given to you by your health care provider. Make sure you discuss any questions you have with your health care provider. Document Revised: 04/17/2021 Document Reviewed: 04/17/2021 Elsevier Patient Education  [place], MD [facility]

## 2022-05-08 NOTE — Patient Instructions (Signed)
Asthma, Adult  Asthma is a condition that causes swelling and narrowing of the airways. These are the passages that lead from the nose and mouth down into the lungs. When asthma symptoms get worse it is called an asthma attack or flare. This can make it hard to breathe. Asthma flares can range from minor to life-threatening. There is no cure for asthma, but medicines and lifestyle changes can help to control it. What are the causes? It is not known exactly what causes asthma, but certain things can cause asthma symptoms to get worse (triggers). What can trigger an asthma attack? Cigarette smoke. Mold. Dust. Your pet's skin flakes (dander). Cockroaches. Pollen. Air pollution (like household cleaners, wood smoke, smog, or chemical odors). What are the signs or symptoms? Trouble breathing (shortness of breath). Coughing. Making high-pitched whistling sounds when you breathe, most often when you breathe out (wheezing). Chest tightness. Tiredness with little activity. Poor exercise tolerance. How is this treated? Controller medicines that help prevent asthma symptoms. Fast-acting reliever or rescue medicines. These give short-term relief of asthma symptoms. Allergy medicines if your attacks are brought on by allergens. Medicines to help control the body's defense (immune) system. Staying away from the things that cause asthma attacks. Follow these instructions at home: Avoiding triggers in your home Do not allow anyone to smoke in your home. Limit use of fireplaces and wood stoves. Get rid of pests (such as roaches and mice) and their droppings. Keep your home clean. Clean your floors. Dust regularly. Use cleaning products that do not smell. Wash bed sheets and blankets every week in hot water. Dry them in a dryer. Have someone vacuum when you are not home. Change your heating and air conditioning filters often. Use blankets that are made of polyester or cotton. General  instructions Take over-the-counter and prescription medicines only as told by your doctor. Do not smoke or use any products that contain nicotine or tobacco. If you need help quitting, ask your doctor. Stay away from secondhand smoke. Avoid doing things outdoors when allergen counts are high and when air quality is low. Warm up before you exercise. Take time to cool down after exercise. Use a peak flow meter as told by your doctor. A peak flow meter is a tool that measures how well your lungs are working. Keep track of the peak flow meter's readings. Write them down. Follow your asthma action plan. This is a written plan for taking care of your asthma and treating your attacks. Make sure you get all the shots (vaccines) that your doctor recommends. Ask your doctor about a flu shot and a pneumonia shot. Keep all follow-up visits. Contact a doctor if: You have wheezing, shortness of breath, or a cough even while taking medicine to prevent attacks. The mucus you cough up (sputum) is thicker than usual. The mucus you cough up changes from clear or white to yellow, green, gray, or is bloody. You have problems from the medicine you are taking, such as: A rash. Itching. Swelling. Trouble breathing. You need reliever medicines more than 2-3 times a week. Your peak flow reading is still at 50-79% of your personal best after following the action plan for 1 hour. You have a fever. Get help right away if: You seem to be worse and are not responding to medicine during an asthma attack. You are short of breath even at rest. You get short of breath when doing very little activity. You have trouble eating, drinking, or talking. You have chest   pain or tightness. You have a fast heartbeat. Your lips or fingernails start to turn blue. You are light-headed or dizzy, or you faint. Your peak flow is less than 50% of your personal best. You feel too tired to breathe normally. These symptoms may be an  emergency. Get help right away. Call 911. Do not wait to see if the symptoms will go away. Do not drive yourself to the hospital. Summary Asthma is a long-term (chronic) condition in which the airways get tight and narrow. An asthma attack can make it hard to breathe. Asthma cannot be cured, but medicines and lifestyle changes can help control it. Make sure you understand how to avoid triggers and how and when to use your medicines. Avoid things that can cause allergy symptoms (allergens). These include animal skin flakes (dander) and pollen from trees or grass. Avoid things that pollute the air. These may include household cleaners, wood smoke, smog, or chemical odors. This information is not intended to replace advice given to you by your health care provider. Make sure you discuss any questions you have with your health care provider. Document Revised: 04/17/2021 Document Reviewed: 04/17/2021 Elsevier Patient Education  2023 Elsevier Inc.  

## 2022-05-09 LAB — LDL CHOLESTEROL, DIRECT: Direct LDL: 88 mg/dL

## 2022-05-09 LAB — LIPID PANEL
Cholesterol: 158 mg/dL (ref 0–200)
HDL: 46.5 mg/dL (ref >39.00)
NonHDL: 111.89
Total CHOL/HDL Ratio: 3
Triglycerides: 228 mg/dL — ABNORMAL HIGH (ref 0.0–149.0)
VLDL: 45.6 mg/dL — ABNORMAL HIGH (ref 0.0–40.0)

## 2022-05-09 LAB — CBC WITH DIFFERENTIAL/PLATELET
Basophils Absolute: 0.1 10*3/uL (ref 0.0–0.1)
Basophils Relative: 1.7 % (ref 0.0–3.0)
Eosinophils Absolute: 0.2 10*3/uL (ref 0.0–0.7)
Eosinophils Relative: 3.9 % (ref 0.0–5.0)
HCT: 39.3 % (ref 39.0–52.0)
Hemoglobin: 13.9 g/dL (ref 13.0–17.0)
Lymphocytes Relative: 36.8 % (ref 12.0–46.0)
Lymphs Abs: 2.1 10*3/uL (ref 0.7–4.0)
MCHC: 35.4 g/dL (ref 30.0–36.0)
MCV: 84.4 fl (ref 78.0–100.0)
Monocytes Absolute: 0.5 10*3/uL (ref 0.1–1.0)
Monocytes Relative: 8.6 % (ref 3.0–12.0)
Neutro Abs: 2.8 10*3/uL (ref 1.4–7.7)
Neutrophils Relative %: 49 % (ref 43.0–77.0)
Platelets: 322 10*3/uL (ref 150.0–400.0)
RBC: 4.66 Mil/uL (ref 4.22–5.81)
RDW: 12.7 % (ref 11.5–15.5)
WBC: 5.7 10*3/uL (ref 4.0–10.5)

## 2022-05-09 LAB — COMPREHENSIVE METABOLIC PANEL
ALT: 32 U/L (ref 0–53)
AST: 23 U/L (ref 0–37)
Albumin: 4.4 g/dL (ref 3.5–5.2)
Alkaline Phosphatase: 60 U/L (ref 39–117)
BUN: 13 mg/dL (ref 6–23)
CO2: 30 mEq/L (ref 19–32)
Calcium: 9.7 mg/dL (ref 8.4–10.5)
Chloride: 100 mEq/L (ref 96–112)
Creatinine, Ser: 1 mg/dL (ref 0.40–1.50)
GFR: 98.13 mL/min (ref >60.00)
Glucose, Bld: 90 mg/dL (ref 70–99)
Potassium: 3.9 mEq/L (ref 3.5–5.1)
Sodium: 137 mEq/L (ref 135–145)
Total Bilirubin: 0.4 mg/dL (ref 0.2–1.2)
Total Protein: 7.4 g/dL (ref 6.0–8.3)

## 2022-05-09 LAB — HEMOGLOBIN A1C: Hgb A1c MFr Bld: 5.6 % (ref 4.6–6.5)

## 2022-08-09 IMAGING — CR DG CHEST 2V
1 series · 2 of 2 positions shown · non-contrast
Comparison: Chest x-ray December 06, 2020.

CLINICAL DATA: Chest pain.

EXAM:
CHEST - 2 VIEW

[Series 5: w chest pa · 0.14mm/px · 2 of 2 slices shown]
[im 1/2]
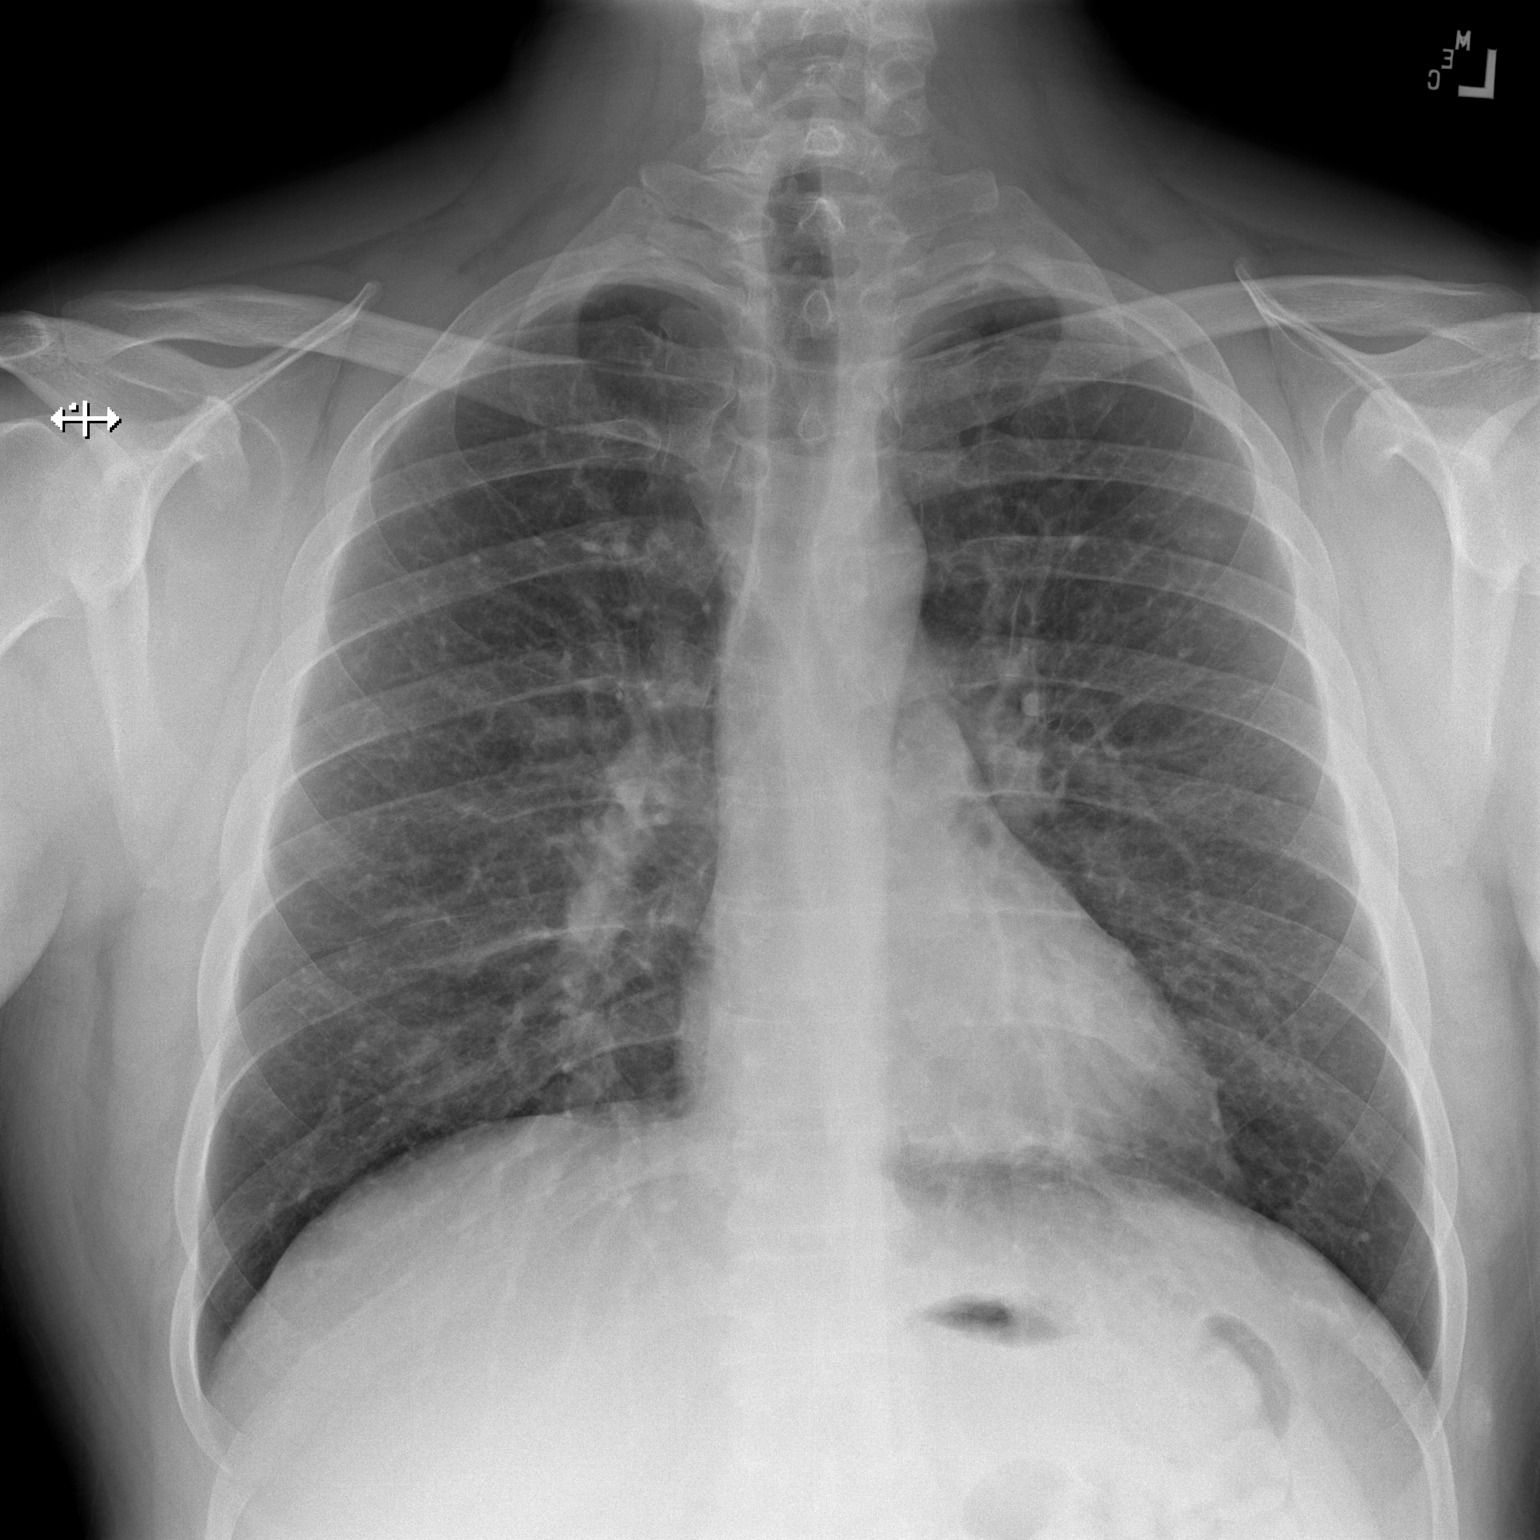
[im 2/2]
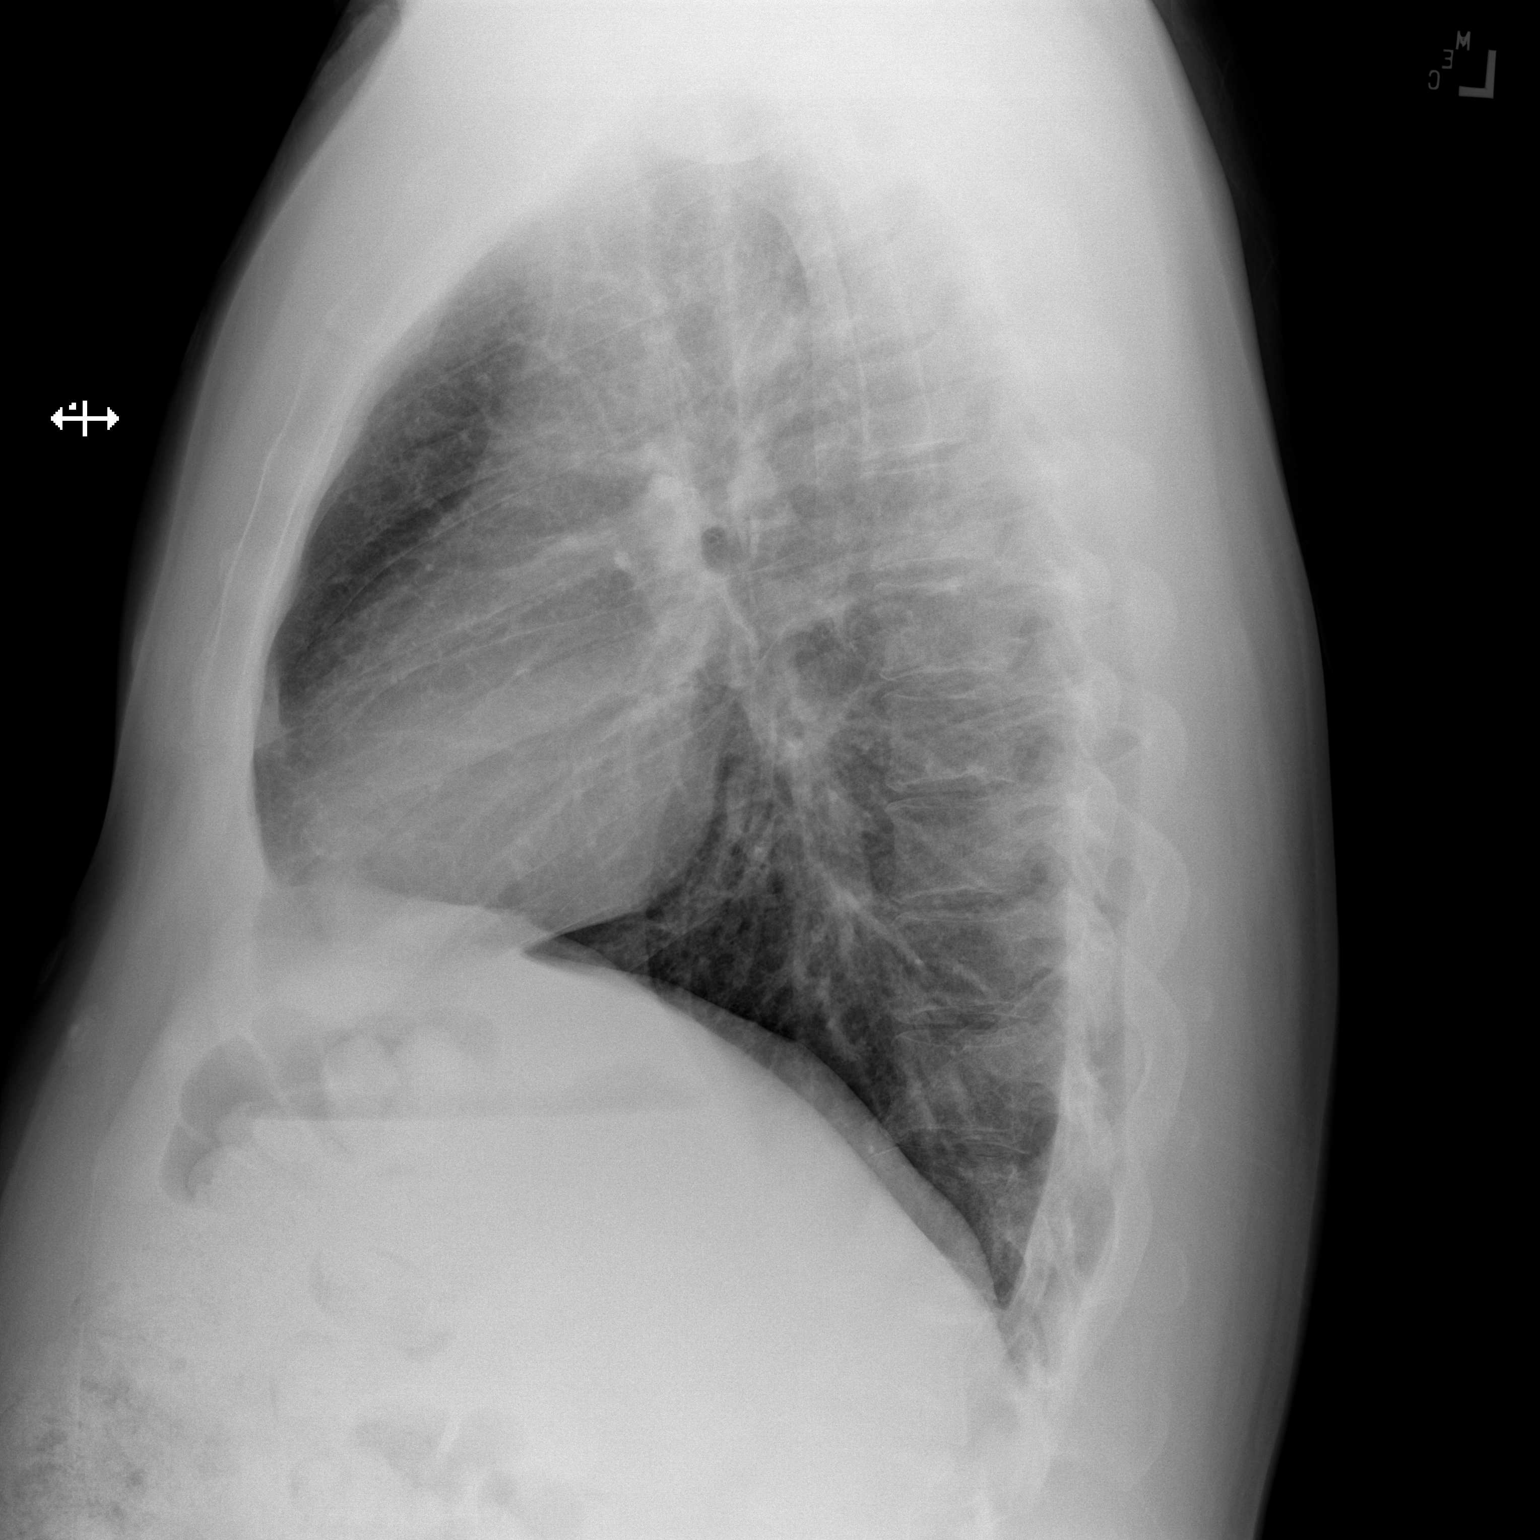

[2 of 2 positions shown; findings below may reference images not displayed]

FINDINGS: Chronic coarsened lung markings, similar. No consolidation. No
visible pleural effusions or pneumothorax. Cardiomediastinal
silhouette is within normal limits and similar to the prior. No
displaced fracture.
IMPRESSION: No evidence of acute cardiopulmonary disease. Similar chronic
findings.

## 2022-09-17 ENCOUNTER — Ambulatory Visit
Admission: EM | Admit: 2022-09-17 | Discharge: 2022-09-17 | Disposition: A | Discharge status: Home / Self Care | Payer: BC Managed Care – PPO | Attending: Urgent Care | Admitting: Urgent Care

## 2022-09-17 ENCOUNTER — Ambulatory Visit (INDEPENDENT_AMBULATORY_CARE_PROVIDER_SITE_OTHER): Payer: BC Managed Care – PPO

## 2022-09-17 DIAGNOSIS — R1011 Right upper quadrant pain: Secondary | ICD-10-CM

## 2022-09-17 DIAGNOSIS — R079 Chest pain, unspecified: Secondary | ICD-10-CM | POA: Diagnosis not present

## 2022-09-17 DIAGNOSIS — R0789 Other chest pain: Secondary | ICD-10-CM

## 2022-09-17 MED ORDER — NAPROXEN 500 MG PO TABS: 500.0000 mg | ORAL_TABLET | Freq: Two times a day (BID) | ORAL | 0 refills | Status: DC

## 2022-09-17 MED ORDER — TIZANIDINE HCL 4 MG PO TABS: 4.0000 mg | ORAL_TABLET | Freq: Every day | ORAL | 0 refills | Status: DC

## 2022-09-17 NOTE — ED Triage Notes (Signed)
Pt repots he has had upper right abdomin pain,  that burns and stings, feels like something is lodged in his stomach, x 1 month Pt says pain is  under his right nipple.   Pt states it is a chunky substance coming up through his throat he feels nauseas x 2 days

## 2022-09-17 NOTE — ED Provider Notes (Signed)
[facility]  Note:  This document was prepared using Systems analyst and may include unintentional dictation errors.  MRN: [id number] DOB: 1986-04-21  Subjective:   Marc Henderson is a 37 y.o. male presenting for 1 month history of persistent and worsening more constant right-sided lower chest pain, right upper quadrant pain.  Reports that he feels a burning stinging sensation, sensation that something is lodged along that area or his stomach.  Has noticed that he has coughed up more chunky substance when he is clearing his throat and has had some nausea for the past 2 days.  No fever, vomiting, shortness of breath, wheezing, diarrhea.  Has a bowel movement every other day. Last use of methadone was February 2023. Has not used IV drugs for more than a year. Last alcohol drink was a week ago. Had 1 beer.    No current facility-administered medications for this encounter.  Current Outpatient Medications:    albuterol (PROVENTIL) (2.5 MG/3ML) 0.083% nebulizer solution, Take 3 mLs (2.5 mg total) by nebulization every 6 (six) hours as needed for wheezing or shortness of breath., Disp: 150 mL, Rfl: 1   albuterol (VENTOLIN HFA) 108 (90 Base) MCG/ACT inhaler, INHALE 2 PUFFS BY MOUTH EVERY 6 HOURS AS NEEDED FOR WHEEZE OR SHORTNESS OF BREATH, Disp: 18 each, Rfl: 11   Fluticasone-Umeclidin-Vilant (TRELEGY ELLIPTA) 100-62.5-25 MCG/ACT AEPB, Inhale 1 puff into the lungs daily. (Patient not taking: Reported on 05/08/2022), Disp: 1 each, Rfl: 11   furosemide (LASIX) 20 MG tablet, TAKE 1 TABLET BY MOUTH EVERY DAY, Disp: 90 tablet, Rfl: 1   mometasone-formoterol (DULERA) 100-5 MCG/ACT AERO, Inhale 2 puffs into the lungs 2 (two) times daily., Disp: 13 g, Rfl: 5   No Known Allergies  Past Medical History:  Diagnosis Date   Asthma    Chest wall pain    Insomnia    IV drug abuse ([place])    Palpitation      History reviewed. No pertinent surgical  history.  History reviewed. No pertinent family history.  Social History   Tobacco Use   Smoking status: Every Day    Packs/day: 1.00    Years: 8.00    Total pack years: 8.00    Types: Cigarettes    Last attempt to quit: 12/02/2019    Years since quitting: 2.7   Smokeless tobacco: Never  Vaping Use   Vaping Use: Former  Substance Use Topics   Alcohol use: Yes    Comment: 1-2 beer/daily   Drug use: Yes    Comment: herion 3-4 times a week    ROS   Objective:   Vitals: BP 122/80 (BP Location: Right Arm)   Pulse 87   Temp 97.8 F (36.6 C) (Oral)   Resp 20   SpO2 95%   Physical Exam Constitutional:      General: He is not in acute distress.    Appearance: Normal appearance. He is well-developed and normal weight. He is not ill-appearing, toxic-appearing or diaphoretic.  HENT:     Head: Normocephalic and atraumatic.     Right Ear: External ear normal.     Left Ear: External ear normal.     Nose: Nose normal.     Mouth/Throat:     Mouth: Mucous membranes are moist.     Pharynx: Oropharynx is clear.  Eyes:     General: No scleral icterus.       Right eye: No discharge.  Left eye: No discharge.     Extraocular Movements: Extraocular movements intact.  Cardiovascular:     Rate and Rhythm: Normal rate and regular rhythm.     Heart sounds: Normal heart sounds. No murmur heard.    No friction rub. No gallop.  Pulmonary:     Effort: Pulmonary effort is normal. No respiratory distress.     Breath sounds: Normal breath sounds. No stridor. No wheezing, rhonchi or rales.  Abdominal:     General: Bowel sounds are normal. There is no distension.     Palpations: Abdomen is soft. There is no mass.     Tenderness: There is abdominal tenderness in the right upper quadrant. There is no right CVA tenderness, left CVA tenderness, guarding or rebound.  Musculoskeletal:     Cervical back: Normal range of motion.  Neurological:     Mental Status: He is alert and oriented to  person, place, and time.  Psychiatric:        Mood and Affect: Mood normal.        Behavior: Behavior normal.        Thought Content: Thought content normal.        Judgment: Judgment normal.    DG Chest 2 View  Result Date: 09/17/2022 CLINICAL DATA:  Right-sided chest pain. EXAM: CHEST - 2 VIEW COMPARISON:  Chest radiograph November 13, 2021. FINDINGS: The heart size and mediastinal contours are within normal limits. Chronic coarsened lung markings bilaterally. No new focal airspace consolidation. No pleural effusion. No pneumothorax. The visualized skeletal structures are unremarkable. IMPRESSION: Similar chronic lung findings without new focal airspace consolidation. Electronically Signed   By: Dahlia Bailiff M.D.   On: 09/17/2022 13:01     Assessment and Plan :   PDMP not reviewed this encounter.  1. RUQ pain   2. Right-sided chest pain     Recommended conservative management including the use of naproxen, tizanidine to address possible musculoskeletal pain.  CMET and CBC results pending.  Will discuss this with patient as they are available.  Recommended he establish care with a new primary care provider as he does not want to go to his previous one.  His primary concern is his gallbladder.  At this stage I do not have the sense that he has an acute abdomen or acute cholecystitis.  The labs may or may not support this.  I discussed this in detail.  Ultrasound and CT imaging will be a lot more useful in this regard.  He will try to pursue this through a new PCP. Counseled patient on potential for adverse effects with medications prescribed/recommended today, ER and return-to-clinic precautions discussed, patient verbalized understanding.    Jaynee Eagles, [place] 09/17/22 1331

## 2022-09-17 NOTE — Discharge Instructions (Addendum)
I recommend using for pain and inflammation.  You can also use tizanidine as a muscle relaxant.  This would be to address musculoskeletal pain as the primary source of your symptoms.  Blood test results should be available tomorrow. We will discuss them then.   I do want to encourage you to set up a new primary care provider so that you can pursue imaging as deemed necessary including an ultrasound or CT scan.  This could look internally at the liver, gallbladder and in general the internal organs.  If you do not want a wait for a PCP then the only other option would be to go to the emergency room where you can have an evaluation more urgently and consideration for imaging through them.

## 2022-09-18 LAB — CBC WITH DIFFERENTIAL/PLATELET
Basophils Absolute: 0.1 10*3/uL (ref 0.0–0.2)
Basos: 1 %
EOS (ABSOLUTE): 0.2 10*3/uL (ref 0.0–0.4)
Eos: 4 %
Hematocrit: 44.8 % (ref 37.5–51.0)
Hemoglobin: 14.9 g/dL (ref 13.0–17.7)
Immature Grans (Abs): 0 10*3/uL (ref 0.0–0.1)
Immature Granulocytes: 0 %
Lymphocytes Absolute: 1.4 10*3/uL (ref 0.7–3.1)
Lymphs: 26 %
MCH: 28 pg (ref 26.6–33.0)
MCHC: 33.3 g/dL (ref 31.5–35.7)
MCV: 84 fL (ref 79–97)
Monocytes Absolute: 0.4 10*3/uL (ref 0.1–0.9)
Monocytes: 7 %
Neutrophils Absolute: 3.4 10*3/uL (ref 1.4–7.0)
Neutrophils: 62 %
Platelets: 297 10*3/uL (ref 150–450)
RBC: 5.33 x10E6/uL (ref 4.14–5.80)
RDW: 13.2 % (ref 11.6–15.4)
WBC: 5.5 10*3/uL (ref 3.4–10.8)

## 2022-09-18 LAB — COMPREHENSIVE METABOLIC PANEL
ALT: 33 IU/L (ref 0–44)
AST: 19 IU/L (ref 0–40)
Albumin/Globulin Ratio: 1.8 (ref 1.2–2.2)
Albumin: 4.6 g/dL (ref 4.1–5.1)
Alkaline Phosphatase: 68 IU/L (ref 44–121)
BUN/Creatinine Ratio: 12 (ref 9–20)
BUN: 11 mg/dL (ref 6–20)
Bilirubin Total: 0.3 mg/dL (ref 0.0–1.2)
CO2: 24 mmol/L (ref 20–29)
Calcium: 9.6 mg/dL (ref 8.7–10.2)
Chloride: 104 mmol/L (ref 96–106)
Creatinine, Ser: 0.92 mg/dL (ref 0.76–1.27)
Globulin, Total: 2.5 g/dL (ref 1.5–4.5)
Glucose: 109 mg/dL — ABNORMAL HIGH (ref 70–99)
Potassium: 4.6 mmol/L (ref 3.5–5.2)
Sodium: 141 mmol/L (ref 134–144)
Total Protein: 7.1 g/dL (ref 6.0–8.5)
eGFR: 112 mL/min/{1.73_m2} (ref >59)

## 2022-10-29 ENCOUNTER — Other Ambulatory Visit: Payer: Self-pay | Admitting: Emergency Medicine

## 2022-10-29 DIAGNOSIS — J4540 Moderate persistent asthma, uncomplicated: Secondary | ICD-10-CM

## 2022-10-29 DIAGNOSIS — J4530 Mild persistent asthma, uncomplicated: Secondary | ICD-10-CM | POA: Diagnosis not present

## 2022-11-27 DIAGNOSIS — J4520 Mild intermittent asthma, uncomplicated: Secondary | ICD-10-CM | POA: Diagnosis not present

## 2022-12-20 DIAGNOSIS — R21 Rash and other nonspecific skin eruption: Secondary | ICD-10-CM | POA: Diagnosis not present

## 2022-12-20 DIAGNOSIS — R509 Fever, unspecified: Secondary | ICD-10-CM | POA: Diagnosis not present

## 2022-12-20 DIAGNOSIS — R229 Localized swelling, mass and lump, unspecified: Secondary | ICD-10-CM | POA: Diagnosis not present

## 2022-12-20 DIAGNOSIS — S80862A Insect bite (nonvenomous), left lower leg, initial encounter: Secondary | ICD-10-CM | POA: Diagnosis not present

## 2022-12-26 ENCOUNTER — Encounter (HOSPITAL_COMMUNITY): Payer: Self-pay

## 2022-12-26 ENCOUNTER — Ambulatory Visit (HOSPITAL_COMMUNITY)
Admission: EM | Admit: 2022-12-26 | Discharge: 2022-12-26 | Disposition: A | Discharge status: Home / Self Care | Payer: BC Managed Care – PPO | Attending: Emergency Medicine | Admitting: Emergency Medicine

## 2022-12-26 DIAGNOSIS — Z760 Encounter for issue of repeat prescription: Secondary | ICD-10-CM

## 2022-12-26 DIAGNOSIS — J4540 Moderate persistent asthma, uncomplicated: Secondary | ICD-10-CM

## 2022-12-26 DIAGNOSIS — J4520 Mild intermittent asthma, uncomplicated: Secondary | ICD-10-CM | POA: Diagnosis not present

## 2022-12-26 MED ORDER — ALBUTEROL SULFATE HFA 108 (90 BASE) MCG/ACT IN AERS: INHALATION_SPRAY | RESPIRATORY_TRACT | 0 refills | Status: DC

## 2022-12-26 NOTE — Discharge Instructions (Addendum)
I have refilled your albuterol inhaler today.  Please use this every 6 hours as needed for wheezing or shortness of breath.  It is important that you follow-up with your primary care provider for further refills and evaluation of your asthma.  You are welcome to return to clinic for any new or concerning symptoms, wheezing, shortness of breath, or fevers.

## 2022-12-26 NOTE — ED Provider Notes (Signed)
[facility]    CSN: [id number] Arrival date & time: 12/26/22  1808      History   Chief Complaint Chief Complaint  Patient presents with   Medication Refill    HPI Marc Henderson is a 37 y.o. male.   Patient presents to clinic requesting a refill of his albuterol inhaler.  He works with heating and air, is up in crawl spaces and attics and usually has to use his inhaler once daily.  He had a falling out with his prior PCP, did schedule with a new primary care provider but they pushed him 2 months out.  He denies any increasing shortness of breath, fevers or wheezing.   The history is provided by the patient and medical records.  Medication Refill   Past Medical History:  Diagnosis Date   Asthma    Chest wall pain    Insomnia    IV drug abuse [facility])    Palpitation     Patient Active Problem List   Diagnosis Date Noted   Moderate persistent asthma, uncomplicated 03/05/2022   Methadone dependence (HCC) 12/04/2019   Current smoker    Alcohol abuse    Chronic low back pain     History reviewed. No pertinent surgical history.     Home Medications    Prior to Admission medications   Medication Sig Start Date End Date Taking? Authorizing Provider  albuterol (PROVENTIL) (2.5 MG/3ML) 0.083% nebulizer solution Take 3 mLs (2.5 mg total) by nebulization every 6 (six) hours as needed for wheezing or shortness of breath. 03/05/22   Georgina Quint, MD  albuterol (VENTOLIN HFA) 108 (90 Base) MCG/ACT inhaler INHALE 2 PUFFS BY MOUTH EVERY 6 HOURS AS NEEDED FOR WHEEZE OR SHORTNESS OF BREATH 12/26/22   Caelum Federici, [place] N, FNP  Fluticasone-Umeclidin-Vilant (TRELEGY ELLIPTA) 100-62.5-25 MCG/ACT AEPB Inhale 1 puff into the lungs daily. Patient not taking: Reported on 05/08/2022 03/05/22   Georgina Quint, MD  furosemide (LASIX) 20 MG tablet TAKE 1 TABLET BY MOUTH EVERY DAY 09/29/20   Georgina Quint, MD  mometasone-formoterol [facility]) 100-5 MCG/ACT AERO  Inhale 2 puffs into the lungs 2 (two) times daily. 05/08/22   Georgina Quint, MD  naproxen (NAPROSYN) 500 MG tablet Take 1 tablet (500 mg total) by mouth 2 (two) times daily with a meal. 09/17/22   Wallis Bamberg, PA-C  tiZANidine (ZANAFLEX) 4 MG tablet Take 1 tablet (4 mg total) by mouth at bedtime. 09/17/22   Wallis Bamberg, PA-C    Family History History reviewed. No pertinent family history.  Social History Social History   Tobacco Use   Smoking status: Every Day    Packs/day: 1.00    Years: 8.00    Additional pack years: 0.00    Total pack years: 8.00    Types: Cigarettes    Last attempt to quit: 12/02/2019    Years since quitting: 3.0   Smokeless tobacco: Never  Vaping Use   Vaping Use: Former  Substance Use Topics   Alcohol use: Not Currently    Comment: 1-2 beer/daily   Drug use: Not Currently    Comment: herion 3-4 times a week     Allergies   Patient has no known allergies.   Review of Systems Review of Systems  Constitutional:  Negative for fatigue and fever.  Respiratory:  Negative for cough, shortness of breath and wheezing.   Cardiovascular:  Negative for chest pain.     Physical Exam Triage Vital Signs ED  Triage Vitals [12/26/22 1923]  Enc Vitals Group     BP 115/87     Pulse Rate 81     Resp 18     Temp 97.9 F (36.6 C)     Temp Source Oral     SpO2 97 %     Weight      Height      Head Circumference      Peak Flow      Pain Score      Pain Loc      Pain Edu?      Excl. in GC?    No data found.  Updated Vital Signs BP 115/87 (BP Location: Right Arm)   Pulse 81   Temp 97.9 F (36.6 C) (Oral)   Resp 18   SpO2 97%   Visual Acuity Right Eye Distance:   Left Eye Distance:   Bilateral Distance:    Right Eye Near:   Left Eye Near:    Bilateral Near:     Physical Exam Vitals and nursing note reviewed.  Constitutional:      Appearance: Normal appearance.  HENT:     Head: Normocephalic and atraumatic.     Right Ear: External  ear normal.     Left Ear: External ear normal.     Nose: Nose normal.     Mouth/Throat:     Mouth: Mucous membranes are moist.  Eyes:     Conjunctiva/sclera: Conjunctivae normal.  Cardiovascular:     Rate and Rhythm: Normal rate.  Pulmonary:     Effort: Pulmonary effort is normal. No respiratory distress.  Neurological:     General: No focal deficit present.     Mental Status: He is alert and oriented to person, place, and time.  Psychiatric:        Mood and Affect: Mood normal.        Behavior: Behavior normal.      UC Treatments / Results  Labs (all labs ordered are listed, but only abnormal results are displayed) Labs Reviewed - No data to display  EKG   Radiology No results found.  Procedures Procedures (including critical care time)  Medications Ordered in UC Medications - No data to display  Initial Impression / Assessment and Plan / UC Course  I have reviewed the triage vital signs and the nursing notes.  Pertinent labs & imaging results that were available during my care of the patient were reviewed by me and considered in my medical decision making (see chart for details).  Vitals and triage reviewed, patient is hemodynamically stable.  Requesting a refill of his albuterol inhaler which she normally uses daily.  Does also uses Dulera.  Has an appointment scheduled with the PCP.  Will refill his albuterol inhaler.  No acute distress in room, oxygenation stable.  Plan of care, follow-up care and return precautions given, no questions at this time.     Final Clinical Impressions(s) / UC Diagnoses   Final diagnoses:  Medication refill     Discharge Instructions      I have refilled your albuterol inhaler today.  Please use this every 6 hours as needed for wheezing or shortness of breath.  It is important that you follow-up with your primary care provider for further refills and evaluation of your asthma.  You are welcome to return to clinic for any new  or concerning symptoms, wheezing, shortness of breath, or fevers.    ED Prescriptions     Medication  Sig Dispense Auth. Provider   albuterol (VENTOLIN HFA) 108 (90 Base) MCG/ACT inhaler INHALE 2 PUFFS BY MOUTH EVERY 6 HOURS AS NEEDED FOR WHEEZE OR SHORTNESS OF BREATH 18 g Tonnie Friedel, [place] N, FNP      PDMP not reviewed this encounter.   Tayonna Bacha, [place] N, [place] 12/26/22 [phone]

## 2022-12-26 NOTE — ED Triage Notes (Signed)
Pt states out of his inhaler and his PCP appt is 6/20. States uses it every day. No sx's at this time.

## 2023-01-09 NOTE — Progress Notes (Deleted)
New patient visit  Patient: Marc Henderson   DOB: Jan 31, 1986   37 y.o. Male  MRN: [id number] Visit Date: 01/10/2023  Today's healthcare provider: Debera Lat, PA-C   No chief complaint on file.  Subjective    ANGELIA Henderson is a 37 y.o. male who presents today as a new patient to establish care.  HPI  ***  Past Medical History:  Diagnosis Date   Asthma    Chest wall pain    Insomnia    IV drug abuse (HCC)    Palpitation    No past surgical history on file. Family Status  Relation Name Status   Mother  Alive   Father  Alive   No family history on file. Social History   Socioeconomic History   Marital status: Married    Spouse name: Not on file   Number of children: Not on file   Years of education: Not on file   Highest education level: Not on file  Occupational History   Not on file  Tobacco Use   Smoking status: Every Day    Packs/day: 1.00    Years: 8.00    Additional pack years: 0.00    Total pack years: 8.00    Types: Cigarettes    Last attempt to quit: 12/02/2019    Years since quitting: 3.1   Smokeless tobacco: Never  Vaping Use   Vaping Use: Former  Substance and Sexual Activity   Alcohol use: Not Currently    Comment: 1-2 beer/daily   Drug use: Not Currently    Comment: herion 3-4 times a week   Sexual activity: Not on file  Other Topics Concern   Not on file  Social History Narrative   Not on file   Social Determinants of Health   Financial Resource Strain: Not on file  Food Insecurity: Not on file  Transportation Needs: Not on file  Physical Activity: Not on file  Stress: Not on file  Social Connections: Not on file   Outpatient Medications Prior to Visit  Medication Sig   albuterol (PROVENTIL) (2.5 MG/3ML) 0.083% nebulizer solution Take 3 mLs (2.5 mg total) by nebulization every 6 (six) hours as needed for wheezing or shortness of breath.   albuterol (VENTOLIN HFA) 108 (90 Base) MCG/ACT inhaler INHALE 2 PUFFS BY MOUTH EVERY  6 HOURS AS NEEDED FOR WHEEZE OR SHORTNESS OF BREATH   Fluticasone-Umeclidin-Vilant (TRELEGY ELLIPTA) 100-62.5-25 MCG/ACT AEPB Inhale 1 puff into the lungs daily. (Patient not taking: Reported on 05/08/2022)   furosemide (LASIX) 20 MG tablet TAKE 1 TABLET BY MOUTH EVERY DAY   mometasone-formoterol (DULERA) 100-5 MCG/ACT AERO Inhale 2 puffs into the lungs 2 (two) times daily.   naproxen (NAPROSYN) 500 MG tablet Take 1 tablet (500 mg total) by mouth 2 (two) times daily with a meal.   tiZANidine (ZANAFLEX) 4 MG tablet Take 1 tablet (4 mg total) by mouth at bedtime.   No facility-administered medications prior to visit.   No Known Allergies  Immunization History  Administered Date(s) Administered   Influenza,inj,Quad PF,6+ Mos 04/24/2017   Influenza-Unspecified 02/25/2019   Tdap 03/02/2016    Health Maintenance  Topic Date Due   COVID-19 Vaccine (1) Never done   INFLUENZA VACCINE  02/21/2023   DTaP/Tdap/Td (2 - Td or Tdap) 03/02/2026   Hepatitis C Screening  Completed   HIV Screening  Completed   HPV VACCINES  Aged Out    Patient Care Team: Georgina Quint, MD as PCP - General (  Internal Medicine)  Review of Systems Except see HPI   {Labs  Heme  Chem  Endocrine  Serology  Results Review (optional):23779}   Objective    There were no vitals taken for this visit. {Show previous vital signs (optional):23777}  Physical Exam  Depression Screen    05/08/2022    4:11 PM 03/05/2022    3:36 PM 07/06/2020    4:25 PM 01/01/2020    1:20 PM  PHQ 2/9 Scores  PHQ - 2 Score 0 6 0 0  PHQ- 9 Score  19     No results found for any visits on 01/10/23.  Assessment & Plan     *** Encounter to establish care Welcomed to our clinic Reviewed past medical hx, social hx, family hx and surgical hx Pt advised to send all vaccination records or screening   No follow-ups on file.    The patient was advised to call back or seek an in-person evaluation if the symptoms worsen or if  the condition fails to improve as anticipated.  I discussed the assessment and treatment plan with the patient. The patient was provided an opportunity to ask questions and all were answered. The patient agreed with the plan and demonstrated an understanding of the instructions.  I, Debera Lat, PA-C have reviewed all documentation for this visit. The documentation on  01/10/23  for the exam, diagnosis, procedures, and orders are all accurate and complete.  Debera Lat, [facility], MMS [facility] [phone] (phone) [phone] (fax)  [facility]

## 2023-01-10 ENCOUNTER — Ambulatory Visit: Payer: Self-pay | Admitting: Physician Assistant

## 2023-02-18 ENCOUNTER — Telehealth: Payer: Self-pay | Admitting: Emergency Medicine

## 2023-02-18 NOTE — Telephone Encounter (Signed)
Okay with me 

## 2023-02-18 NOTE — Telephone Encounter (Signed)
Patient contacted our office looking to establish care closer to home. Wanted to get okay from both providers before setting up [facility] appointment. Okay with everyone to set up TOC?

## 2023-02-18 NOTE — Telephone Encounter (Signed)
Yes, fine with me. 

## 2023-02-19 NOTE — Telephone Encounter (Signed)
Lvmtcb

## 2023-02-19 NOTE — Telephone Encounter (Signed)
Patient is scheduled, thanks everyone.

## 2023-02-26 ENCOUNTER — Encounter: Payer: Self-pay | Admitting: Primary Care

## 2023-02-26 ENCOUNTER — Ambulatory Visit (INDEPENDENT_AMBULATORY_CARE_PROVIDER_SITE_OTHER): Payer: BC Managed Care – PPO | Admitting: Primary Care

## 2023-02-26 VITALS — BP 126/78 | HR 105 | Temp 98.6°F | Ht 71.0 in | Wt 282.0 lb

## 2023-02-26 DIAGNOSIS — J4540 Moderate persistent asthma, uncomplicated: Secondary | ICD-10-CM | POA: Diagnosis not present

## 2023-02-26 DIAGNOSIS — R600 Localized edema: Secondary | ICD-10-CM

## 2023-02-26 DIAGNOSIS — F1911 Other psychoactive substance abuse, in remission: Secondary | ICD-10-CM | POA: Diagnosis not present

## 2023-02-26 MED ORDER — MOMETASONE FURO-FORMOTEROL FUM 100-5 MCG/ACT IN AERO: 2.0000 | INHALATION_SPRAY | Freq: Two times a day (BID) | RESPIRATORY_TRACT | 11 refills | Status: DC

## 2023-02-26 MED ORDER — ALBUTEROL SULFATE HFA 108 (90 BASE) MCG/ACT IN AERS: 2.0000 | INHALATION_SPRAY | Freq: Four times a day (QID) | RESPIRATORY_TRACT | 0 refills | Status: DC | PRN

## 2023-02-26 NOTE — Assessment & Plan Note (Signed)
Likely secondary to his line of work, he was also referred to cardiology and never presented. Offered referral to cardiology today, he declines.  Continue furosemide 10 to 20 mg as needed. Reviewed renal function from October 2023

## 2023-02-26 NOTE — Assessment & Plan Note (Signed)
Inappropriate use of Marc Henderson, discussed with patient today.  Increase Dulera to 2 puffs twice daily as prescribed.  Refills provided.  Consider addition of Singulair 10 mg at bedtime if needed.  Continue albuterol inhaler, discussed only to use if needed.  Refills provided.
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
## 2023-02-26 NOTE — Patient Instructions (Signed)
Start using your [facility] inhaler twice daily.  Inhale 2 puffs into the lungs twice daily every day.  Only use the albuterol rescue inhaler if needed.  Please notify me if you continue to require daily use of your albuterol inhaler despite 2 puffs of your Dulera twice daily.  It was a pleasure to meet you today! Please don't hesitate to contact me with any questions. Welcome to [facility]!

## 2023-02-26 NOTE — Assessment & Plan Note (Signed)
Commended him on remission!  Continue to monitor.

## 2023-03-18 ENCOUNTER — Other Ambulatory Visit: Payer: Self-pay | Admitting: Primary Care

## 2023-03-18 DIAGNOSIS — J4540 Moderate persistent asthma, uncomplicated: Secondary | ICD-10-CM

## 2023-04-01 ENCOUNTER — Telehealth: Payer: Self-pay | Admitting: Primary Care

## 2023-04-01 DIAGNOSIS — J4540 Moderate persistent asthma, uncomplicated: Secondary | ICD-10-CM

## 2023-04-01 NOTE — Telephone Encounter (Signed)
Prescription Request  04/01/2023  LOV: 02/26/2023  What is the name of the medication or equipment? albuterol (VENTOLIN HFA) 108 (90 Base) MCG/ACT inhaler   Have you contacted your pharmacy to request a refill? No   Which pharmacy would you like this sent to?  [name], [place] - [name] [name] [address] Phone: [phone] Fax: [phone]    Patient notified that their request is being sent to the clinical staff for review and that they should receive a response within 2 business days.   Please advise at Mobile [phone] (mobile)

## 2023-04-02 MED ORDER — MONTELUKAST SODIUM 10 MG PO TABS: 10.0000 mg | ORAL_TABLET | Freq: Every day | ORAL | 0 refills | Status: DC

## 2023-04-02 NOTE — Telephone Encounter (Signed)
Called patient he is using the [facility] two puffs bid. He has been using the albuterol 2-3 times a day two puffs each time. He is out of albuterol

## 2023-04-02 NOTE — Addendum Note (Signed)
Addended by: Doreene Nest on: 04/02/2023 03:27 PM   Modules accepted: Orders

## 2023-04-02 NOTE — Telephone Encounter (Signed)
Please notify patient that he's using his albuterol inhaler too much. This tells [place] that his asthma is uncontrolled.  I will refill his albuterol inhaler, but my goal is for him to not have to use his albuterol inhaler hardly at all.  I would like to send a pill to his pharmacy for asthma called Singulair. This is taken every evening at bedtime.   Is he willing to try this? I think it could help.   Recommend follow up in the office in 1 month for asthma.

## 2023-04-02 NOTE — Telephone Encounter (Signed)
Called and spoke with patient he is agreeable to Singulair, states previously it was too expensive with his insurance, but he will let us know if it is this time.  Scheduled 1 month f/u.

## 2023-04-02 NOTE — Telephone Encounter (Signed)
Noted, Rx sent to pharmacy. 

## 2023-04-02 NOTE — Telephone Encounter (Signed)
Please call patient:  We just refilled his albuterol (rescue) inhaler 1 month ago.  Is he already out?  How often is he having to use his rescue inhaler?  This inhaler is to only be used as needed as a rescue.  Is he using his Dulera inhaler 2 puffs 2 times daily every day?  This inhaler is meant to be used every day.

## 2023-04-02 NOTE — Telephone Encounter (Signed)
Patient called in and stated that he is needing this inhaler. He stated that he has plenty of refills for the other inhaler but not this one. [facility]. He would like a call regarding this matter at [phone]. Thank you!

## 2023-04-03 MED ORDER — ALBUTEROL SULFATE HFA 108 (90 BASE) MCG/ACT IN AERS: 2.0000 | INHALATION_SPRAY | Freq: Four times a day (QID) | RESPIRATORY_TRACT | 0 refills | Status: DC | PRN

## 2023-04-03 NOTE — Telephone Encounter (Signed)
Pt called stating he was told there was no rx sent in for albuterol (VENTOLIN HFA) 108 (90 Base) MCG/ACT inhaler. Pt wants to know if rx is going to be sent in or not? Call back # [phone]

## 2023-04-03 NOTE — Addendum Note (Signed)
Addended by: Doreene Nest on: 04/03/2023 04:42 PM   Modules accepted: Orders

## 2023-04-03 NOTE — Telephone Encounter (Signed)
Prescription sent to pharmacy.

## 2023-04-22 ENCOUNTER — Other Ambulatory Visit: Payer: Self-pay | Admitting: Primary Care

## 2023-04-22 DIAGNOSIS — J4540 Moderate persistent asthma, uncomplicated: Secondary | ICD-10-CM

## 2023-04-22 NOTE — Telephone Encounter (Signed)
Please call patient:  Was he able to get the Singulair pill for allergies/asthma? Any improvement?  Also received another refill request for albuterol. Is he already out or is this just on auto fill with the pharmacy?

## 2023-04-23 NOTE — Telephone Encounter (Signed)
Unable to reach patient. Left voicemail to return call to our office.   

## 2023-04-24 NOTE — Telephone Encounter (Signed)
Unable to reach patient. Left voicemail to return call to our office.   

## 2023-04-25 NOTE — Telephone Encounter (Signed)
Noted.  Has he noticed improvement in his symptoms since we increased his inhaler to 2 puffs twice daily?  How often is he having to use his albuterol inhaler?

## 2023-04-25 NOTE — Telephone Encounter (Signed)
Called and spoke with patient, he states he has not noticed any improvement with taking Singulair.  He does not need a refill of Albuterol, this was automated from pharmacy.

## 2023-04-26 NOTE — Telephone Encounter (Signed)
Spoke with patient and he stated that there has been no improvement with the mometasone-formoterol (DULERA) 100-5 MCG/ACT AERO. He stated that as far as the albuterol inhaler he is using that one 3-5 times a day, but he has been using it less. He stated that he has about 116 puffs left so he isn't needing the refill yet.

## 2023-04-26 NOTE — Telephone Encounter (Signed)
I would like for him to see pulmonology for further evaluation of his uncontrolled asthma.  It is not good for him to have to use his albuterol as frequently as he does.  Is he willing to see pulmonology?  If so [place] or [facility]?

## 2023-04-29 ENCOUNTER — Other Ambulatory Visit: Payer: Self-pay | Admitting: Primary Care

## 2023-04-29 DIAGNOSIS — J4540 Moderate persistent asthma, uncomplicated: Secondary | ICD-10-CM

## 2023-04-29 NOTE — Telephone Encounter (Signed)
Spoke with patient and he stated that he would see a pulmonologist as long as his insurance will cover it. He stated that he would prefer somewhere in [place].

## 2023-04-29 NOTE — Telephone Encounter (Signed)
Noted, referral placed.  

## 2023-05-14 ENCOUNTER — Ambulatory Visit: Payer: BC Managed Care – PPO | Admitting: Primary Care

## 2023-05-14 ENCOUNTER — Encounter: Payer: Self-pay | Admitting: Primary Care

## 2023-05-14 ENCOUNTER — Ambulatory Visit (INDEPENDENT_AMBULATORY_CARE_PROVIDER_SITE_OTHER)
Admission: RE | Admit: 2023-05-14 | Discharge: 2023-05-14 | Disposition: A | Discharge status: Home / Self Care | Payer: BC Managed Care – PPO | Source: Ambulatory Visit | Attending: Primary Care | Admitting: Primary Care

## 2023-05-14 VITALS — BP 134/82 | HR 88 | Temp 97.8°F | Ht 71.0 in | Wt 277.0 lb

## 2023-05-14 DIAGNOSIS — J4540 Moderate persistent asthma, uncomplicated: Secondary | ICD-10-CM

## 2023-05-14 DIAGNOSIS — R062 Wheezing: Secondary | ICD-10-CM | POA: Diagnosis not present

## 2023-05-14 MED ORDER — ALBUTEROL SULFATE HFA 108 (90 BASE) MCG/ACT IN AERS: 2.0000 | INHALATION_SPRAY | Freq: Four times a day (QID) | RESPIRATORY_TRACT | 0 refills | Status: DC | PRN

## 2023-05-14 MED ORDER — MOMETASONE FURO-FORMOTEROL FUM 200-5 MCG/ACT IN AERO: 2.0000 | INHALATION_SPRAY | Freq: Two times a day (BID) | RESPIRATORY_TRACT | 9 refills | Status: DC

## 2023-05-14 NOTE — Progress Notes (Unsigned)
Established Patient Office Visit  Subjective   Patient ID: Marc Henderson, male    DOB: 01-Aug-1985  Age: 37 y.o. MRN: [id number]  Chief Complaint  Patient presents with   Medical Management of Chronic Issues    HPI  Marc Henderson is a very pleasant 37 y.o. male with a history of asthma and drug abuse who presents today for asthma follow-up.  He is currently managed on Dulera 100-5 mcg 2 puffs BID and is compliant to this. He takes Singulair 10 mg daily but does not feel like this does anything for him. He is using his albuterol rescue inhaler 2 times per day. This is decreased from using his albuterol inhaler 3-5 times per day, but still frequent.  He mainly needs to use his albuterol inhaler when he is working. 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 He was referred to Pulmonology and has an appointment scheduled with them in 2 weeks. He has cut back his smoking to 0.25 PPD. He remains in remission from drug abuse.   Review of Systems  HENT:  Negative for congestion.   Eyes:  Negative for blurred vision and double vision.  Respiratory:  Positive for shortness of breath and wheezing. Negative for cough and stridor.   Cardiovascular:  Negative for chest pain.  Gastrointestinal:  Negative for nausea and vomiting.  Neurological:  Negative for dizziness, tingling, sensory change, weakness and headaches.  Psychiatric/Behavioral:  Negative for depression. The patient is not nervous/anxious.      Objective:     BP 134/82   Pulse 88   Temp 97.8 F (36.6 C) (Temporal)   Ht 5\' 11"  (1.803 m)   Wt 277 lb (125.6 kg)   SpO2 98%   BMI 38.63 kg/m   BP Readings from Last 3 Encounters:  05/14/23 134/82  02/26/23 126/78  12/26/22 115/87   Wt Readings from Last 3 Encounters:  05/14/23 277 lb (125.6 kg)  02/26/23 282 lb (127.9 kg)  05/08/22 262 lb (118.8 kg)    Physical Exam Cardiovascular:     Rate and  Rhythm: Normal rate and regular rhythm.     Pulses: Normal pulses.     Heart sounds: Normal heart sounds.  Pulmonary:     Effort: Pulmonary effort is normal. No respiratory distress.     Breath sounds: No stridor. Wheezing present.  Chest:     Chest wall: No tenderness.  Skin:    General: Skin is dry.  Neurological:     General: No focal deficit present.     Mental Status: He is alert and oriented to person, place, and time.    No results found for any visits on 05/14/23.  {Labs (Optional):23779}  The ASCVD Risk score (Arnett DK, et al., 2019) failed to calculate for the following reasons:   The 2019 ASCVD risk score is only valid for ages 3 to 58    Assessment & Plan:   Problem List Items Addressed This Visit       Respiratory   Moderate persistent asthma, uncomplicated - Primary    Uncontrolled, inappropriate use of SABA.   Increase Dulera to 200-5 mcg 2 puffs BID.   Stop Singulair 10 mg as it is ineffective for him.   Albuterol rescue inhaler refilled.   Chest x-ray pending.   Pulmonology referral placed, appointment scheduled in 2 weeks.  Discussed smoking cessation or cutting back.   He will message  via MyChart if he needs refills or has other concerns.   Follow up with Pulmonology.       Relevant Medications   albuterol (VENTOLIN HFA) 108 (90 Base) MCG/ACT inhaler   mometasone-formoterol (DULERA) 200-5 MCG/ACT AERO   Other Relevant Orders   DG Chest 2 View    No follow-ups on file.    Benito Mccreedy, RN

## 2023-05-14 NOTE — Patient Instructions (Addendum)
Complete xray(s) prior to leaving today. I will notify you of your results once received.  Stop Singulair 10 mg daily.   We increased your Dulera to 200-5 mcg. It is the same instructions as before, inhale 2 puffs into the lungs 2 times per day. Rinse mouth after each use.  We refilled your Albuterol rescue inhaler.   Please feel free to message via MyChart any time you need refills or have other concerns.   Go to your Pulmonology appointment in 2 weeks.

## 2023-05-14 NOTE — Progress Notes (Unsigned)
Subjective:    Patient ID: SKLYER BAGE, male    DOB: 1985-09-23, 37 y.o.   MRN: [id number]  HPI  MORDCHE PARPART is a very pleasant 37 y.o. male with a history of moderate persistent asthma who presents today for follow up of asthma.  Currently managed on Dulera 100-5 mcg, 2 puffs BID for which he is using BID. He is also managed on albuterol inhaler PRN.  He is using his albuterol inhaler twice daily which is a reduction from 4-5 times daily previously. 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   Symptoms include wheezing, chest tightness, shortness of breath. He's been out of his albuterol for 3 days, has noticed increased symptoms.  He hasn't found improvement in symptoms since initiation of Singulair. He has an appointment scheduled with pulmonology in 2 weeks.  He has resumed cigarette use, is smoking a few cigarettes per day.  Review of Systems  Constitutional:  Negative for fever.  Respiratory:  Positive for chest tightness, shortness of breath and wheezing.   Cardiovascular:  Negative for chest pain.         Past Medical History:  Diagnosis Date   Alcohol abuse    Asthma    Chest wall pain    Current smoker    Insomnia    IV drug abuse (HCC)    Palpitation     Social History   Socioeconomic History   Marital status: Married    Spouse name: Not on file   Number of children: Not on file   Years of education: Not on file   Highest education level: Not on file  Occupational History   Not on file  Tobacco Use   Smoking status: Every Day    Current packs/day: 0.00    Average packs/day: 1 pack/day for 8.0 years (8.0 ttl pk-yrs)    Types: Cigarettes    Start date: 12/02/2011    Last attempt to quit: 12/02/2019    Years since quitting: 3.4   Smokeless tobacco: Never  Vaping Use   Vaping status: Former  Substance and Sexual Activity   Alcohol  use: Not Currently    Comment: 1-2 beer/daily   Drug use: Not Currently    Comment: herion 3-4 times a week   Sexual activity: Not on file  Other Topics Concern   Not on file  Social History Narrative   Not on file   Social Determinants of Health   Financial Resource Strain: Not on file  Food Insecurity: Not on file  Transportation Needs: Not on file  Physical Activity: Not on file  Stress: Not on file  Social Connections: Not on file  Intimate Partner Violence: Not on file    History reviewed. No pertinent surgical history.  Family History  Problem Relation Age of Onset   Hypertension Mother    Alcohol abuse Mother    COPD Father    Heart disease Father     No Known Allergies  No current outpatient medications on file prior to visit.   No current facility-administered medications on file prior to visit.    BP 134/82   Pulse 88   Temp 97.8 F (36.6 C) (Temporal)   Ht 5\' 11"  (1.803 m)   Wt 277 lb (125.6 kg)   SpO2 98%   BMI 38.63 kg/m  Objective:   Physical Exam Cardiovascular:  Rate and Rhythm: Normal rate and regular rhythm.  Pulmonary:     Effort: Pulmonary effort is normal.     Breath sounds: Examination of the right-upper field reveals wheezing and rhonchi. Examination of the left-upper field reveals wheezing. Examination of the right-lower field reveals wheezing. Examination of the left-lower field reveals wheezing. Wheezing and rhonchi present.  Musculoskeletal:     Cervical back: Neck supple.  Skin:    General: Skin is warm and dry.  Neurological:     Mental Status: He is alert and oriented to person, place, and time.  Psychiatric:        Mood and Affect: Mood normal.           Assessment & Plan:  Moderate persistent asthma, uncomplicated Assessment & Plan: Uncontrolled, inappropriate use of SABA.   Increase Dulera to 200-5 mcg 2 puffs BID.   Stop Singulair 10 mg as it is ineffective.  Albuterol rescue inhaler refilled.   Chest  x-ray pending to rule out other causes for symptoms.   Follow up with pulmonology as scheduled.  Discussed smoking cessation or cutting back.   I evaluated patient, was consulted regarding treatment, and agree with assessment and plan per Tenna Delaine, RN, DNP student.   Mayra Reel, NP-C   Orders: -     DG Chest 2 View -     Albuterol Sulfate HFA; Inhale 2 puffs into the lungs every 6 (six) hours as needed for wheezing or shortness of breath. INHALE 2 PUFFS BY MOUTH EVERY 6 HOURS AS NEEDED FOR WHEEZE OR SHORTNESS OF BREATH  Dispense: 18 g; Refill: 0 -     Mometasone Furo-Formoterol Fum; Inhale 2 puffs into the lungs 2 (two) times daily. Rinse mouth after each use.  Dispense: 13 g; Refill: 9        Doreene Nest, NP

## 2023-05-14 NOTE — Assessment & Plan Note (Signed)
Uncontrolled, inappropriate use of SABA.   Increase Dulera to 200-5 mcg 2 puffs BID.   Stop Singulair 10 mg as it is ineffective.  Albuterol rescue inhaler refilled.   Chest x-ray pending to rule out other causes for symptoms.   Follow up with pulmonology as scheduled.  Discussed smoking cessation or cutting back.   I evaluated patient, was consulted regarding treatment, and agree with assessment and plan per Tenna Delaine, RN, DNP student.   Mayra Reel, NP-C

## 2023-05-24 ENCOUNTER — Encounter: Payer: Self-pay | Admitting: Pulmonary Disease

## 2023-05-27 ENCOUNTER — Institutional Professional Consult (permissible substitution): Payer: BC Managed Care – PPO | Admitting: Pulmonary Disease

## 2023-05-29 ENCOUNTER — Encounter: Payer: BC Managed Care – PPO | Admitting: Primary Care

## 2023-05-30 ENCOUNTER — Encounter: Payer: Self-pay | Admitting: Primary Care

## 2023-06-19 ENCOUNTER — Other Ambulatory Visit: Payer: Self-pay | Admitting: Primary Care

## 2023-06-19 DIAGNOSIS — J4540 Moderate persistent asthma, uncomplicated: Secondary | ICD-10-CM

## 2023-06-19 MED ORDER — ALBUTEROL SULFATE HFA 108 (90 BASE) MCG/ACT IN AERS: 2.0000 | INHALATION_SPRAY | Freq: Four times a day (QID) | RESPIRATORY_TRACT | 0 refills | Status: DC | PRN

## 2023-06-30 ENCOUNTER — Other Ambulatory Visit: Payer: Self-pay | Admitting: Primary Care

## 2023-06-30 DIAGNOSIS — J4540 Moderate persistent asthma, uncomplicated: Secondary | ICD-10-CM

## 2023-07-03 ENCOUNTER — Emergency Department (HOSPITAL_COMMUNITY)
Admission: EM | Admit: 2023-07-03 | Discharge: 2023-07-03 | Disposition: A | Discharge status: Home / Self Care | Payer: BC Managed Care – PPO | Attending: Emergency Medicine | Admitting: Emergency Medicine

## 2023-07-03 ENCOUNTER — Other Ambulatory Visit: Payer: Self-pay

## 2023-07-03 ENCOUNTER — Emergency Department (HOSPITAL_COMMUNITY): Payer: BC Managed Care – PPO

## 2023-07-03 DIAGNOSIS — R918 Other nonspecific abnormal finding of lung field: Secondary | ICD-10-CM | POA: Diagnosis not present

## 2023-07-03 DIAGNOSIS — S39012A Strain of muscle, fascia and tendon of lower back, initial encounter: Secondary | ICD-10-CM | POA: Diagnosis not present

## 2023-07-03 DIAGNOSIS — M545 Low back pain, unspecified: Secondary | ICD-10-CM | POA: Diagnosis not present

## 2023-07-03 DIAGNOSIS — R0600 Dyspnea, unspecified: Secondary | ICD-10-CM | POA: Diagnosis not present

## 2023-07-03 DIAGNOSIS — M4856XA Collapsed vertebra, not elsewhere classified, lumbar region, initial encounter for fracture: Secondary | ICD-10-CM | POA: Diagnosis not present

## 2023-07-03 DIAGNOSIS — Y92410 Unspecified street and highway as the place of occurrence of the external cause: Secondary | ICD-10-CM | POA: Diagnosis not present

## 2023-07-03 DIAGNOSIS — R079 Chest pain, unspecified: Secondary | ICD-10-CM | POA: Diagnosis not present

## 2023-07-03 DIAGNOSIS — M5416 Radiculopathy, lumbar region: Secondary | ICD-10-CM | POA: Diagnosis not present

## 2023-07-03 LAB — URINALYSIS, ROUTINE W REFLEX MICROSCOPIC
Bacteria, UA: NONE SEEN
Bilirubin Urine: NEGATIVE
Glucose, UA: NEGATIVE mg/dL
Hgb urine dipstick: NEGATIVE
Ketones, ur: NEGATIVE mg/dL
Nitrite: NEGATIVE
Protein, ur: NEGATIVE mg/dL
Specific Gravity, Urine: 1.015 (ref 1.005–1.030)
pH: 5 (ref 5.0–8.0)

## 2023-07-03 MED ORDER — CYCLOBENZAPRINE HCL 10 MG PO TABS: 10.0000 mg | ORAL_TABLET | Freq: Two times a day (BID) | ORAL | 0 refills | Status: DC | PRN

## 2023-07-03 MED ORDER — OXYCODONE-ACETAMINOPHEN 5-325 MG PO TABS
2.0000 | ORAL_TABLET | Freq: Once | ORAL | Status: AC
  Administered 2023-07-03: 2 via ORAL
  Filled 2023-07-03: qty 2
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
## 2023-07-19 ENCOUNTER — Telehealth: Payer: Self-pay | Admitting: *Deleted

## 2023-07-19 NOTE — Progress Notes (Signed)
Transition Care Management Unsuccessful Follow-up Telephone Call  Date of discharge and from where:  [facility]  07/03/2023  Attempts:  1st Attempt  Reason for unsuccessful TCM follow-up call:  No answer/busy

## 2023-07-25 ENCOUNTER — Other Ambulatory Visit: Payer: Self-pay | Admitting: Primary Care

## 2023-07-25 DIAGNOSIS — J4540 Moderate persistent asthma, uncomplicated: Secondary | ICD-10-CM

## 2023-07-25 MED ORDER — ALBUTEROL SULFATE HFA 108 (90 BASE) MCG/ACT IN AERS: 2.0000 | INHALATION_SPRAY | Freq: Four times a day (QID) | RESPIRATORY_TRACT | 0 refills | Status: DC | PRN

## 2023-07-29 ENCOUNTER — Institutional Professional Consult (permissible substitution): Payer: BC Managed Care – PPO | Admitting: Pulmonary Disease

## 2023-09-12 ENCOUNTER — Other Ambulatory Visit: Payer: Self-pay | Admitting: Primary Care

## 2023-09-12 DIAGNOSIS — J4540 Moderate persistent asthma, uncomplicated: Secondary | ICD-10-CM

## 2023-09-12 NOTE — Telephone Encounter (Signed)
Please call patient:  Received refill request for his albuterol inhaler.  It seems that he is still using his albuterol inhaler too much.  It also looks like he no showed his pulmonology appointment in January 2025.  Was there a problem with the appointment? I strongly advised that he reschedule.  Does he plan on rescheduling?

## 2023-09-13 ENCOUNTER — Telehealth: Payer: Self-pay

## 2023-09-13 MED ORDER — ALBUTEROL SULFATE HFA 108 (90 BASE) MCG/ACT IN AERS: 2.0000 | INHALATION_SPRAY | Freq: Four times a day (QID) | RESPIRATORY_TRACT | 0 refills | Status: DC | PRN

## 2023-09-13 NOTE — Telephone Encounter (Signed)
Called and spoke with patient, he stated he is using his albuterol inhaler once daily. He is using his Dulera inhaler twice daily. He says work has been very busy, and he does plan on rescheduling with pulmonology. Strongly encouraged he go ahead and call to schedule and keep appt.

## 2023-09-13 NOTE — Telephone Encounter (Signed)
Copied from CRM [id number]. Topic: General - Call Back - No Documentation >> Sep 13, 2023 10:15 AM Kathryne Eriksson wrote: Reason for CRM: Inhaler Refill Request >> Sep 13, 2023 10:28 AM Kathryne Eriksson wrote: Patient states someone from the office called him and instructed him to schedule with a pulmonary specalist in order to have his inhaler refilled. Patient has an appointment scheduled with [facility] March 11th at 3:30 with Dr.Gonzales. Patient is requesting a call back in regards to rather or not his inhaler can be refilled , [phone]

## 2023-09-13 NOTE — Telephone Encounter (Signed)
Noted. I see that he has rescheduled for March.

## 2023-09-13 NOTE — Telephone Encounter (Signed)
Called and spoke with patient, advised albuterol inhaler was sent in today.

## 2023-09-30 ENCOUNTER — Telehealth: Admitting: Physician Assistant

## 2023-09-30 DIAGNOSIS — K047 Periapical abscess without sinus: Secondary | ICD-10-CM | POA: Diagnosis not present

## 2023-09-30 MED ORDER — AMOXICILLIN-POT CLAVULANATE 875-125 MG PO TABS: 1.0000 | ORAL_TABLET | Freq: Two times a day (BID) | ORAL | 0 refills | Status: DC

## 2023-09-30 NOTE — Patient Instructions (Signed)
 Marc Henderson, thank you for joining Margaretann Loveless, PA-C for today's virtual visit.  While this provider is not your primary care provider (PCP), if your PCP is located in our provider database this encounter information will be shared with them immediately following your visit.   A [facility] MyChart account gives you access to today's visit and all your visits, tests, and labs performed at [facility] " click here if you don't have a  MyChart account or go to [email or web address]  Consent: (Patient) Marc Henderson provided verbal consent for this virtual visit at the beginning of the encounter.  Current Medications:  Current Outpatient Medications:    amoxicillin-clavulanate (AUGMENTIN) 875-125 MG tablet, Take 1 tablet by mouth 2 (two) times daily., Disp: 14 tablet, Rfl: 0   albuterol (VENTOLIN HFA) 108 (90 Base) MCG/ACT inhaler, Inhale 2 puffs into the lungs every 6 (six) hours as needed for wheezing or shortness of breath. INHALE 2 PUFFS BY MOUTH EVERY 6 HOURS AS NEEDED FOR WHEEZE OR SHORTNESS OF BREATH, Disp: 8 g, Rfl: 0   cyclobenzaprine (FLEXERIL) 10 MG tablet, Take 1 tablet (10 mg total) by mouth 2 (two) times daily as needed for muscle spasms., Disp: 20 tablet, Rfl: 0   mometasone-formoterol (DULERA) 200-5 MCG/ACT AERO, Inhale 2 puffs into the lungs 2 (two) times daily. Rinse mouth after each use., Disp: 13 g, Rfl: 9   Medications ordered in this encounter:  Meds ordered this encounter  Medications   amoxicillin-clavulanate (AUGMENTIN) 875-125 MG tablet    Sig: Take 1 tablet by mouth 2 (two) times daily.    Dispense:  14 tablet    Refill:  0    Supervising Provider:   Merrilee Jansky [[id number]]     *If you need refills on other medications prior to your next appointment, please contact your pharmacy*  Follow-Up: Call back or seek an in-person evaluation if the symptoms worsen or if the condition fails to improve as anticipated.  [facility] [phone]  Other Instructions Dental Abscess  A dental abscess is an infection around a tooth that may involve pain, swelling, and a collection of pus, as well as other symptoms. Treatment is important to help with symptoms and to prevent the infection from spreading. The general types of dental abscesses are: Pulpal abscess. This abscess may form from the inner part of the tooth (pulp). Periodontal abscess. This abscess may form from the gum. What are the causes? This condition is caused by a bacterial infection in or around the tooth. It may result from: Severe tooth decay (cavities). Trauma to the tooth, such as a broken or chipped tooth. What increases the risk? This condition is more likely to develop in males. It is also more likely to develop in people who: Have cavities. Have severe gum disease. Eat sugary snacks between meals. Use tobacco products. Have diabetes. Have a weakened disease-fighting system (immune system). Do not brush and care for their teeth regularly. What are the signs or symptoms? Mild symptoms of this condition include: Tenderness. Bad breath. Fever. A bitter taste in the mouth. Pain in and around the infected tooth. Moderate symptoms of this condition include: Swollen neck glands. Chills. Pus drainage. Swelling and redness around the infected tooth, in the mouth, or in the face. Severe pain in and around the infected tooth. Severe symptoms of this condition include: Difficulty swallowing. Difficulty opening the mouth. Nausea. Vomiting. How is this diagnosed? This condition is diagnosed based on:  Your symptoms and your medical and dental history. An examination of the infected tooth. During the exam, your dental care provider may tap on the infected tooth. You may also need to have X-rays taken of the affected area. How is this treated? This condition is treated by getting rid of the infection. This may be done  with: Antibiotic medicines. These may be used in certain situations. Antibacterial mouth rinse. Incision and drainage. This procedure is done by making an incision in the abscess to drain out the pus. Removing pus is the first priority in treating an abscess. A root canal. This may be performed to save the tooth. Your dental care provider accesses the visible part of your tooth (crown) with a drill and removes any infected pulp. Then the space is filled and sealed off. Tooth extraction. The tooth is pulled out if it cannot be saved by other treatment. You may also receive treatment for pain, such as: Acetaminophen or NSAIDs. Gels that contain a numbing medicine. An injection to block the pain near your nerve. Follow these instructions at home: Medicines Take over-the-counter and prescription medicines only as told by your dental care provider. If you were prescribed an antibiotic, take it as told by your dental care provider. Do not stop taking the antibiotic even if you start to feel better. If you were prescribed a gel that contains a numbing medicine, use it exactly as told in the directions. Do not use these gels for children who are younger than 12 years of age. Use an antibacterial mouth rinse as told by your dental care provider. General instructions  Gargle with a mixture of salt and water 3-4 times a day or as needed. To make salt water, completely dissolve -1 tsp (3-6 g) of salt in 1 cup (237 mL) of warm water. Eat a soft diet while your abscess is healing. Drink enough fluid to keep your urine pale yellow. Do not apply heat to the outside of your mouth. Do not use any products that contain nicotine or tobacco. These products include cigarettes, chewing tobacco, and vaping devices, such as e-cigarettes. If you need help quitting, ask your dental care provider. Keep all follow-up visits. This is important. How is this prevented?  Excellent dental home care, which includes brushing  your teeth every morning and night with fluoride toothpaste. Floss one time each day. Get regularly scheduled dental cleanings. Consider having a dental sealant applied on teeth that have deep grooves to prevent cavities. Drink fluoridated water regularly. This includes most tap water. Check the label on bottled water to see if it contains fluoride. Reduce or eliminate sugary drinks. Eat healthy meals and snacks. Wear a mouth guard or face shield to protect your teeth while playing sports. Contact a health care provider if: Your pain is worse and is not helped by medicine. You have swelling. You see pus around the tooth. You have a fever or chills. Get help right away if: Your symptoms suddenly get worse. You have a very bad headache. You have problems breathing or swallowing. You have trouble opening your mouth. You have swelling in your neck or around your eye. These symptoms may represent a serious problem that is an emergency. Do not wait to see if the symptoms will go away. Get medical help right away. Call your local emergency services (911 in the [place] Do not drive yourself to the hospital. Summary A dental abscess is a collection of pus in or around a tooth that results from  an infection. A dental abscess may result from severe tooth decay, trauma to the tooth, or severe gum disease around a tooth. Symptoms include severe pain, swelling, redness, and drainage of pus in and around the infected tooth. The first priority in treating a dental abscess is to drain out the pus. Treatment may also involve removing damage inside the tooth (root canal) or extracting the tooth. This information is not intended to replace advice given to you by your health care provider. Make sure you discuss any questions you have with your health care provider. Document Revised: 09/15/2020 Document Reviewed: 09/15/2020 Elsevier Patient Education  2024 Elsevier Inc.   If you have been instructed to have  an in-person evaluation today at a local Urgent Care facility, please use the link below. It will take you to a list of all of our available [facility], including address, phone number and hours of operation. Please do not delay care.  [facility]  If you or a family member do not have a primary care provider, use the link below to schedule a visit and establish care. When you choose a [facility] primary care physician or advanced practice provider, you gain a long-term partner in health. Find a Primary Care Provider  Learn more about [facility] in-office and virtual care options: [facility] - Get Care Now

## 2023-09-30 NOTE — Progress Notes (Signed)
 Virtual Visit Consent   Marc Henderson, you are scheduled for a virtual visit with a [facility] provider today. Just as with appointments in the office, your consent must be obtained to participate. Your consent will be active for this visit and any virtual visit you may have with one of our providers in the next 365 days. If you have a MyChart account, a copy of this consent can be sent to you electronically.  As this is a virtual visit, video technology does not allow for your provider to perform a traditional examination. This may limit your provider's ability to fully assess your condition. If your provider identifies any concerns that need to be evaluated in person or the need to arrange testing (such as labs, EKG, etc.), we will make arrangements to do so. Although advances in technology are sophisticated, we cannot ensure that it will always work on either your end or our end. If the connection with a video visit is poor, the visit may have to be switched to a telephone visit. With either a video or telephone visit, we are not always able to ensure that we have a secure connection.  By engaging in this virtual visit, you consent to the provision of healthcare and authorize for your insurance to be billed (if applicable) for the services provided during this visit. Depending on your insurance coverage, you may receive a charge related to this service.  I need to obtain your verbal consent now. Are you willing to proceed with your visit today? WAI LITT has provided verbal consent on 09/30/2023 for a virtual visit (video or telephone). Margaretann Loveless, PA-C  Date: 09/30/2023 3:07 PM   Virtual Visit via Video Note   I, Margaretann Loveless, connected with  Marc Henderson  ([id number], Feb 07, 1986) on 09/30/23 at  3:00 PM EDT by a video-enabled telemedicine application and verified that I am speaking with the correct person using two identifiers.  Location: Patient: Virtual Visit  Location Patient: Mobile Provider: Virtual Visit Location Provider: Home Office   I discussed the limitations of evaluation and management by telemedicine and the availability of in person appointments. The patient expressed understanding and agreed to proceed.    History of Present Illness: Marc Henderson is a 38 y.o. who identifies as a male who was assigned male at birth, and is being seen today for dental abscess.  HPI: Dental Pain  This is a new problem. The current episode started in the past 7 days (5-6 days). The problem occurs constantly. The problem has been gradually worsening. The pain is mild. Associated symptoms include facial pain and thermal sensitivity. Pertinent negatives include no difficulty swallowing, fever, oral bleeding or sinus pressure. He has tried acetaminophen and NSAIDs for the symptoms. The treatment provided no relief.     Problems:  Patient Active Problem List   Diagnosis Date Noted   Moderate persistent asthma, uncomplicated 03/05/2022   History of drug abuse in remission (HCC) 12/04/2019   Bilateral lower extremity edema 11/18/2018   Chronic low back pain     Allergies: No Known Allergies Medications:  Current Outpatient Medications:    amoxicillin-clavulanate (AUGMENTIN) 875-125 MG tablet, Take 1 tablet by mouth 2 (two) times daily., Disp: 14 tablet, Rfl: 0   albuterol (VENTOLIN HFA) 108 (90 Base) MCG/ACT inhaler, Inhale 2 puffs into the lungs every 6 (six) hours as needed for wheezing or shortness of breath. INHALE 2 PUFFS BY MOUTH EVERY 6 HOURS AS NEEDED FOR WHEEZE  OR SHORTNESS OF BREATH, Disp: 8 g, Rfl: 0   cyclobenzaprine (FLEXERIL) 10 MG tablet, Take 1 tablet (10 mg total) by mouth 2 (two) times daily as needed for muscle spasms., Disp: 20 tablet, Rfl: 0   mometasone-formoterol (DULERA) 200-5 MCG/ACT AERO, Inhale 2 puffs into the lungs 2 (two) times daily. Rinse mouth after each use., Disp: 13 g, Rfl: 9  Observations/Objective: Patient is  well-developed, well-nourished in no acute distress.  Resting comfortably  Head is normocephalic, atraumatic.  No labored breathing.  Speech is clear and coherent with logical content.  Patient is alert and oriented at baseline.    Assessment and Plan: 1. Dental abscess (Primary) - amoxicillin-clavulanate (AUGMENTIN) 875-125 MG tablet; Take 1 tablet by mouth 2 (two) times daily.  Dispense: 14 tablet; Refill: 0  - Suspected infection with broken tooth - Augmentin prescribed - Can use ice on outside jaw/cheek for swelling - Can also take tylenol for pain with other medications - Schedule a follow with a dentist as soon as possible (Can contact [facility] if under insured or uninsured) - Seek in person evaluation if symptoms fail to improve or if they worsen   Follow Up Instructions: I discussed the assessment and treatment plan with the patient. The patient was provided an opportunity to ask questions and all were answered. The patient agreed with the plan and demonstrated an understanding of the instructions.  A copy of instructions were sent to the patient via MyChart unless otherwise noted below.    The patient was advised to call back or seek an in-person evaluation if the symptoms worsen or if the condition fails to improve as anticipated.    Margaretann Loveless, PA-C

## 2023-10-01 ENCOUNTER — Institutional Professional Consult (permissible substitution): Payer: BC Managed Care – PPO | Admitting: Pulmonary Disease

## 2023-10-21 ENCOUNTER — Telehealth: Payer: Self-pay

## 2023-10-21 NOTE — Telephone Encounter (Signed)
 Copied from CRM [id number]. Topic: Referral - Request for Referral >> Oct 21, 2023 11:19 AM Aletta Edouard wrote: Did the patient discuss referral with their provider in the last year? yes (If No - schedule appointment) (If Yes - send message)  Appointment offered? Yes  Type of order/referral and detailed reason for visit: unknown  Preference of office, provider, location: cardiologist that is on network    If referral order, have you been seen by this specialty before? No (If Yes, this issue or another issue? When? Where?  Can we respond through MyChart? Yes

## 2023-10-21 NOTE — Telephone Encounter (Signed)
 Unclear regarding which referral is being requested. Regardless, appreciate Tabitha's evaluation.

## 2023-10-22 ENCOUNTER — Encounter: Payer: Self-pay | Admitting: Family

## 2023-10-22 ENCOUNTER — Ambulatory Visit: Attending: Family

## 2023-10-22 ENCOUNTER — Ambulatory Visit (INDEPENDENT_AMBULATORY_CARE_PROVIDER_SITE_OTHER): Admitting: Family

## 2023-10-22 VITALS — BP 130/78 | HR 104 | Temp 98.2°F | Ht 71.0 in | Wt 273.0 lb

## 2023-10-22 DIAGNOSIS — M79605 Pain in left leg: Secondary | ICD-10-CM

## 2023-10-22 DIAGNOSIS — R002 Palpitations: Secondary | ICD-10-CM | POA: Diagnosis not present

## 2023-10-22 DIAGNOSIS — R600 Localized edema: Secondary | ICD-10-CM | POA: Diagnosis not present

## 2023-10-22 DIAGNOSIS — R12 Heartburn: Secondary | ICD-10-CM | POA: Insufficient documentation

## 2023-10-22 DIAGNOSIS — J4541 Moderate persistent asthma with (acute) exacerbation: Secondary | ICD-10-CM | POA: Insufficient documentation

## 2023-10-22 DIAGNOSIS — Z720 Tobacco use: Secondary | ICD-10-CM | POA: Insufficient documentation

## 2023-10-22 DIAGNOSIS — Z79899 Other long term (current) drug therapy: Secondary | ICD-10-CM | POA: Diagnosis not present

## 2023-10-22 DIAGNOSIS — J4540 Moderate persistent asthma, uncomplicated: Secondary | ICD-10-CM

## 2023-10-22 DIAGNOSIS — M79604 Pain in right leg: Secondary | ICD-10-CM | POA: Insufficient documentation

## 2023-10-22 DIAGNOSIS — G6289 Other specified polyneuropathies: Secondary | ICD-10-CM | POA: Diagnosis not present

## 2023-10-22 DIAGNOSIS — I872 Venous insufficiency (chronic) (peripheral): Secondary | ICD-10-CM | POA: Insufficient documentation

## 2023-10-22 DIAGNOSIS — M79672 Pain in left foot: Secondary | ICD-10-CM | POA: Insufficient documentation

## 2023-10-22 LAB — COMPREHENSIVE METABOLIC PANEL WITH GFR
ALT: 19 U/L (ref 0–53)
AST: 15 U/L (ref 0–37)
Albumin: 4.3 g/dL (ref 3.5–5.2)
Alkaline Phosphatase: 72 U/L (ref 39–117)
BUN: 13 mg/dL (ref 6–23)
CO2: 28 meq/L (ref 19–32)
Calcium: 9.3 mg/dL (ref 8.4–10.5)
Chloride: 102 meq/L (ref 96–112)
Creatinine, Ser: 1.04 mg/dL (ref 0.40–1.50)
GFR: 92.67 mL/min (ref >60.00)
Glucose, Bld: 106 mg/dL — ABNORMAL HIGH (ref 70–99)
Potassium: 4.1 meq/L (ref 3.5–5.1)
Sodium: 138 meq/L (ref 135–145)
Total Bilirubin: 0.3 mg/dL (ref 0.2–1.2)
Total Protein: 7 g/dL (ref 6.0–8.3)

## 2023-10-22 LAB — LIPID PANEL
Cholesterol: 177 mg/dL (ref 0–200)
HDL: 43.4 mg/dL (ref >39.00)
LDL Cholesterol: 83 mg/dL (ref 0–99)
NonHDL: 133.6
Total CHOL/HDL Ratio: 4
Triglycerides: 251 mg/dL — ABNORMAL HIGH (ref 0.0–149.0)
VLDL: 50.2 mg/dL — ABNORMAL HIGH (ref 0.0–40.0)

## 2023-10-22 LAB — POCT GLYCOSYLATED HEMOGLOBIN (HGB A1C): Hemoglobin A1C: 5.6 % (ref 4.0–5.6)

## 2023-10-22 LAB — CBC
HCT: 44.2 % (ref 39.0–52.0)
Hemoglobin: 15 g/dL (ref 13.0–17.0)
MCHC: 34 g/dL (ref 30.0–36.0)
MCV: 85 fl (ref 78.0–100.0)
Platelets: 350 10*3/uL (ref 150.0–400.0)
RBC: 5.2 Mil/uL (ref 4.22–5.81)
RDW: 13 % (ref 11.5–15.5)
WBC: 7.6 10*3/uL (ref 4.0–10.5)

## 2023-10-22 LAB — TSH: TSH: 0.73 u[IU]/mL (ref 0.35–5.50)

## 2023-10-22 LAB — VITAMIN B12: Vitamin B-12: 495 pg/mL (ref 211–911)

## 2023-10-22 MED ORDER — ALBUTEROL SULFATE HFA 108 (90 BASE) MCG/ACT IN AERS: 2.0000 | INHALATION_SPRAY | Freq: Four times a day (QID) | RESPIRATORY_TRACT | 0 refills | Status: DC | PRN

## 2023-10-22 MED ORDER — OMEPRAZOLE 20 MG PO CPDR: 20.0000 mg | DELAYED_RELEASE_CAPSULE | Freq: Every day | ORAL | 0 refills | Status: DC

## 2023-10-22 MED ORDER — NICOTINE 21 MG/24HR TD PT24
21.0000 mg | MEDICATED_PATCH | Freq: Every day | TRANSDERMAL | 1 refills | Status: AC
Start: 2023-10-22 — End: ?

## 2023-10-22 MED ORDER — FUROSEMIDE 20 MG PO TABS: ORAL_TABLET | ORAL | 0 refills | Status: AC

## 2023-10-22 MED ORDER — PREDNISONE 10 MG (21) PO TBPK: ORAL_TABLET | ORAL | 0 refills | Status: DC

## 2023-10-22 MED ORDER — MONTELUKAST SODIUM 10 MG PO TABS: 10.0000 mg | ORAL_TABLET | Freq: Every day | ORAL | 3 refills | Status: DC

## 2023-10-22 NOTE — Assessment & Plan Note (Signed)
 Zio monitor ordered Trial to control heart burn may be contributing  EKG to be considered at f/u with pcp in two weeks.  Er precautions advised Could consider cardiology referral

## 2023-10-22 NOTE — Progress Notes (Signed)
 Established Patient Office Visit  Subjective:   Patient ID: Marc Henderson, male    DOB: 09/16/1985  Age: 38 y.o. MRN: [id number]  CC:  Chief Complaint  Patient presents with   Acute Visit    Reports having pain in his feet x2-3 months. States that it feels like needles in his feet.    HPI: Marc Henderson is a 38 y.o. male presenting on 10/22/2023 for Acute Visit (Reports having pain in his feet x2-3 months. States that it feels like needles in his feet.)  In the last few months with constant burning and tingling in the bottom of his feet however 'the other day' probably about four days ago he felt some on the left lateral side of his foot and felt sharp and stabbing causing him to walk on the inside of his foot. If he puts pressure on the outer side of the left foot he finds it very painful. He says even to the touch it feels like an intense stabbing pain. Felt this yesterday.   He carries fluid in his lower extremities which is chronic. He does have h/o furosemide prn use which helps when his lower ext get really swollen.   He does c/o palpitations everyday intermittently. Will notice it every ten minutes. He does not drink caffeine and stopped drinking because of this which decreased the frequency of the palpitations. Does experience centralized midline chest pain not associated with sob. Will still occur at least 30 or more times a day, fleets for a few seconds and then goes. Intense heartburn all of the time.   He has been smoking for years, he started smoking nicotine 16 years ago. He smokes about a pack a day about 20 cigarettes a day. He thinks about quitting everyday.   U/s venous lower extremity 11/17/2018 no DVT   Often with wheezing states this is normal for him with smoking and asthma. He is on dulera using albuterol inhaler about once daily.       ROS: Negative unless specifically indicated above in HPI.   Relevant past medical history reviewed and updated as  indicated.   Allergies and medications reviewed and updated.   Current Outpatient Medications:    cyclobenzaprine (FLEXERIL) 10 MG tablet, Take 1 tablet (10 mg total) by mouth 2 (two) times daily as needed for muscle spasms., Disp: 20 tablet, Rfl: 0   furosemide (LASIX) 20 MG tablet, Take one po every day prn edema, Disp: 20 tablet, Rfl: 0   mometasone-formoterol (DULERA) 200-5 MCG/ACT AERO, Inhale 2 puffs into the lungs 2 (two) times daily. Rinse mouth after each use., Disp: 13 g, Rfl: 9   montelukast (SINGULAIR) 10 MG tablet, Take 1 tablet (10 mg total) by mouth at bedtime., Disp: 30 tablet, Rfl: 3   nicotine (NICODERM CQ - DOSED IN MG/24 HOURS) 21 mg/24hr patch, Place 1 patch (21 mg total) onto the skin daily., Disp: 28 patch, Rfl: 1   omeprazole (PRILOSEC) 20 MG capsule, Take 1 capsule (20 mg total) by mouth daily., Disp: 30 capsule, Rfl: 0   predniSONE (STERAPRED UNI-PAK 21 TAB) 10 MG (21) TBPK tablet, Take as directed, Disp: 1 each, Rfl: 0   albuterol (VENTOLIN HFA) 108 (90 Base) MCG/ACT inhaler, Inhale 2 puffs into the lungs every 6 (six) hours as needed for wheezing or shortness of breath. INHALE 2 PUFFS BY MOUTH EVERY 6 HOURS AS NEEDED FOR WHEEZE OR SHORTNESS OF BREATH, Disp: 8 g, Rfl: 0  No Known Allergies  Objective:   BP 130/78 (BP Location: Left Arm, Patient Position: Sitting, Cuff Size: Large)   Pulse (!) 104   Temp 98.2 F (36.8 C) (Temporal)   Ht 5\' 11"  (1.803 m)   Wt 273 lb (123.8 kg)   SpO2 97%   BMI 38.08 kg/m    Physical Exam Constitutional:      General: He is not in acute distress.    Appearance: Normal appearance. He is normal weight. He is not ill-appearing, toxic-appearing or diaphoretic.  Cardiovascular:     Rate and Rhythm: Normal rate and regular rhythm.     Pulses: Normal pulses. No decreased pulses.     Comments: Lower ext bil dermatitis with brown pigmentation  Pulmonary:     Effort: Pulmonary effort is normal.     Breath sounds: No decreased air  movement. Examination of the right-upper field reveals wheezing. Examination of the left-upper field reveals wheezing. Examination of the right-middle field reveals wheezing. Examination of the left-middle field reveals wheezing. Examination of the right-lower field reveals wheezing. Examination of the left-lower field reveals wheezing. Wheezing present.  Musculoskeletal:        General: Normal range of motion.     Right lower leg: 1+ Edema present.     Left lower leg: 1+ Edema present.  Feet:     Comments: Left lateral midline swelling with 10/10 tenderness on palpation even brush tenderness  Neurological:     General: No focal deficit present.     Mental Status: He is alert and oriented to person, place, and time. Mental status is at baseline.  Psychiatric:        Mood and Affect: Mood normal.        Behavior: Behavior normal.        Thought Content: Thought content normal.        Judgment: Judgment normal.     Assessment & Plan:  Tobacco use Assessment & Plan: Smoking cessation instruction/counseling given:  counseled patient on the dangers of tobacco use, advised patient to stop smoking, and reviewed strategies to maximize success   Orders: -     Nicotine; Place 1 patch (21 mg total) onto the skin daily.  Dispense: 28 patch; Refill: 1  Pain in both lower extremities Assessment & Plan: Suspected neuropathy /vascular insuff B12 ordered A1c in office, 5.6 normal range.  Could consider gabapentin in the future if needed    Orders: -     Vitamin B12  Other polyneuropathy -     Vitamin B12 -     POCT glycosylated hemoglobin (Hb A1C)  Chronic venous insufficiency of lower extremity Assessment & Plan: Referral placed for vascular for eval/treat    Orders: -     Ambulatory referral to Vascular Surgery  Pedal edema -     Furosemide; Take one po every day prn edema  Dispense: 20 tablet; Refill: 0  Palpitations Assessment & Plan: Zio monitor ordered Trial to control  heart burn may be contributing  EKG to be considered at f/u with pcp in two weeks.  Er precautions advised Could consider cardiology referral   Orders: -     Comprehensive metabolic panel with GFR -     TSH -     CBC -     LONG TERM MONITOR (3-14 DAYS); Future  On statin therapy -     Lipid panel  Heartburn Assessment & Plan: Try to decrease and or avoid spicy foods, fried fatty foods, and also caffeine and chocolate as  these can increase heartburn symptoms.  Trial omeprazole 20 mg po every day for 30 days   Orders: -     Omeprazole; Take 1 capsule (20 mg total) by mouth daily.  Dispense: 30 capsule; Refill: 0  Moderate persistent asthma with exacerbation Assessment & Plan: Rx prednisone pack take as directed Continue dulera  Rx singulair 10 mg nightly  Albuterol prn   Referral to pulmonary for pft/spirometry   Orders: -     Montelukast Sodium; Take 1 tablet (10 mg total) by mouth at bedtime.  Dispense: 30 tablet; Refill: 3 -     Pulmonary Visit -     predniSONE; Take as directed  Dispense: 1 each; Refill: 0  Moderate persistent asthma, uncomplicated -     Albuterol Sulfate HFA; Inhale 2 puffs into the lungs every 6 (six) hours as needed for wheezing or shortness of breath. INHALE 2 PUFFS BY MOUTH EVERY 6 HOURS AS NEEDED FOR WHEEZE OR SHORTNESS OF BREATH  Dispense: 8 g; Refill: 0  Bilateral lower extremity edema Assessment & Plan: Refill sent for furosemide to use prn Cmp ordered pending results.  Contributing factor could be venous insufficiency   Pain in left foot Assessment & Plan: Not fully convinced gout however with brush palpation, with pain so possible. Treating with prednisone. If no improvement f/u and will consider xray.  Heat/ice to site if tolerated Wear appropriate fitting shoes.       Follow up plan: Return in about 2 weeks (around 11/05/2023) for follow up with PCP Jae Dire.  Mort Sawyers, FNP

## 2023-10-22 NOTE — Assessment & Plan Note (Signed)
 Try to decrease and or avoid spicy foods, fried fatty foods, and also caffeine and chocolate as these can increase heartburn symptoms.  Trial omeprazole 20 mg po every day for 30 days

## 2023-10-22 NOTE — Assessment & Plan Note (Signed)
 Refill sent for furosemide to use prn Cmp ordered pending results.  Contributing factor could be venous insufficiency

## 2023-10-22 NOTE — Assessment & Plan Note (Addendum)
 Suspected neuropathy /vascular insuff B12 ordered A1c in office, 5.6 normal range.  Could consider gabapentin in the future if needed

## 2023-10-22 NOTE — Assessment & Plan Note (Signed)
 Not fully convinced gout however with brush palpation, with pain so possible. Treating with prednisone. If no improvement f/u and will consider xray.  Heat/ice to site if tolerated Wear appropriate fitting shoes.

## 2023-10-22 NOTE — Patient Instructions (Addendum)
  Try to decrease and or avoid spicy foods, fried fatty foods, and also caffeine and chocolate as these can increase heartburn symptoms.  Trial omeprazole x 30 days  ------------------------------------  Ordered a zio monitor for your palpitations which you will wear for two weeks If you do not hear anything from the company to have this sent to your home in two weeks please let us know.   ------------------------------------  Refill furosemide, only as needed for swelling in lower extremities    ------------------------------------ A referral was placed today for vascular doctor and pulmonary  Please let us know if you have not heard back within 2 weeks about the referral.  Adding on singulair take this every night.    ------------------------------------  Trial prednisone for foot as well as for asthma exacerbation

## 2023-10-22 NOTE — Assessment & Plan Note (Signed)
 Referral placed for vascular for eval/treat

## 2023-10-22 NOTE — Assessment & Plan Note (Signed)
 Smoking cessation instruction/counseling given:  counseled patient on the dangers of tobacco use, advised patient to stop smoking, and reviewed strategies to maximize success

## 2023-10-22 NOTE — Assessment & Plan Note (Signed)
 Rx prednisone pack take as directed Continue dulera  Rx singulair 10 mg nightly  Albuterol prn   Referral to pulmonary for pft/spirometry

## 2023-10-23 ENCOUNTER — Encounter: Payer: Self-pay | Admitting: Family

## 2023-11-07 ENCOUNTER — Ambulatory Visit: Admitting: Primary Care

## 2023-11-09 ENCOUNTER — Emergency Department

## 2023-11-09 ENCOUNTER — Other Ambulatory Visit: Payer: Self-pay

## 2023-11-09 ENCOUNTER — Emergency Department
Admission: EM | Admit: 2023-11-09 | Discharge: 2023-11-09 | Disposition: A | Discharge status: Home / Self Care | Attending: Emergency Medicine | Admitting: Emergency Medicine

## 2023-11-09 DIAGNOSIS — Y990 Civilian activity done for income or pay: Secondary | ICD-10-CM | POA: Diagnosis not present

## 2023-11-09 DIAGNOSIS — J45909 Unspecified asthma, uncomplicated: Secondary | ICD-10-CM | POA: Insufficient documentation

## 2023-11-09 DIAGNOSIS — M51369 Other intervertebral disc degeneration, lumbar region without mention of lumbar back pain or lower extremity pain: Secondary | ICD-10-CM | POA: Insufficient documentation

## 2023-11-09 DIAGNOSIS — M47816 Spondylosis without myelopathy or radiculopathy, lumbar region: Secondary | ICD-10-CM | POA: Diagnosis not present

## 2023-11-09 DIAGNOSIS — M47817 Spondylosis without myelopathy or radiculopathy, lumbosacral region: Secondary | ICD-10-CM | POA: Diagnosis not present

## 2023-11-09 DIAGNOSIS — M545 Low back pain, unspecified: Secondary | ICD-10-CM | POA: Diagnosis not present

## 2023-11-09 DIAGNOSIS — M5126 Other intervertebral disc displacement, lumbar region: Secondary | ICD-10-CM | POA: Diagnosis not present

## 2023-11-09 DIAGNOSIS — X500XXA Overexertion from strenuous movement or load, initial encounter: Secondary | ICD-10-CM | POA: Diagnosis not present

## 2023-11-09 DIAGNOSIS — M5136 Other intervertebral disc degeneration, lumbar region with discogenic back pain only: Secondary | ICD-10-CM | POA: Diagnosis not present

## 2023-11-09 MED ORDER — PREDNISONE 10 MG (21) PO TBPK: ORAL_TABLET | ORAL | 0 refills | Status: DC

## 2023-11-09 MED ORDER — HYDROCODONE-ACETAMINOPHEN 5-325 MG PO TABS: 1.0000 | ORAL_TABLET | Freq: Four times a day (QID) | ORAL | 0 refills | Status: AC | PRN

## 2023-11-09 MED ORDER — KETOROLAC TROMETHAMINE 60 MG/2ML IM SOLN
30.0000 mg | Freq: Once | INTRAMUSCULAR | Status: AC
  Administered 2023-11-09: 30 mg via INTRAMUSCULAR
  Filled 2023-11-09: qty 2

## 2023-11-09 MED ORDER — CYCLOBENZAPRINE HCL 10 MG PO TABS: 10.0000 mg | ORAL_TABLET | Freq: Three times a day (TID) | ORAL | 0 refills | Status: DC | PRN

## 2023-11-09 NOTE — ED Triage Notes (Signed)
 Pt states he hurt his back at work on Wednesday. States he stayed out work Thursday and Friday to rest his back, but it hasn't gotten any better. Pt states he has been using tylenol  & ibuprofen , and a heating pad to help with the pain.

## 2023-11-09 NOTE — ED Notes (Signed)
 See triage note Presents with lower back pain.  States he lifted something heavy on Wednesday   States pain became worse Thursday  Describes pain as intermittent and sharp  Pain stays at lower back  Non radiating  ambulates slowly d/t pain

## 2023-11-09 NOTE — ED Provider Notes (Signed)
 [facility] Emergency Department Provider Note ____________________________________________  Time seen: Approximately 10:49 AM  I have reviewed the triage vital signs and the nursing notes.   HISTORY  Chief Complaint No chief complaint on file.    HPI Marc Henderson is a 38 y.o. male with history of asthma and as listed in EMR who presents to the emergency department for evaluation and treatment of low back pain.  While at work on Wednesday, he lifted 1 end of a generator and strained his back.  Initially it did not hurt very bad but throughout the evening and days following pain has worsened.  No relief with Tylenol  and ibuprofen .    PHYSICAL EXAM:  VITAL SIGNS: ED Triage Vitals  Encounter Vitals Group     BP 11/09/23 0959 (!) 154/93     Systolic BP Percentile --      Diastolic BP Percentile --      Pulse Rate 11/09/23 0959 (!) 128     Resp 11/09/23 0959 17     Temp 11/09/23 0959 98.2 F (36.8 C)     Temp Source 11/09/23 0959 Oral     SpO2 11/09/23 0959 97 %     Weight 11/09/23 0956 275 lb (124.7 kg)     Height 11/09/23 0956 5\' 11"  (1.803 m)     Head Circumference --      Peak Flow --      Pain Score 11/09/23 0955 7     Pain Loc --      Pain Education --      Exclude from Growth Chart --     Constitutional: Alert and oriented. Well appearing and in no acute distress. Eyes: Conjunctivae are clear without discharge or drainage Head: Atraumatic Neck: Supple Respiratory: No cough. Respirations are even and unlabored. Musculoskeletal: Diffuse transverse lower back pain.  No focal midline tenderness. Neurologic: Motor and sensory function is intact.  No radiculopathy Skin: Intact Psychiatric: Affect and behavior are appropriate.  ____________________________________________   LABS (all labs ordered are listed, but only abnormal results are displayed)  Labs Reviewed - No data to  display ____________________________________________  RADIOLOGY  Lumbar spine imaging negative for acute concerns.  CT lumbar spine shows no acute concerns however multiple levels of bulging disks without myelopathy  I, Cynithia Hakimi, personally viewed and evaluated these images (plain radiographs) as part of my medical decision making, as well as reviewing the written report by the radiologist.  ____________________________________________   PROCEDURES  Procedures  ____________________________________________   INITIAL IMPRESSION / ASSESSMENT AND PLAN / ED COURSE  Marc Henderson is a 38 y.o. who presents to the emergency department for evaluation of transverse low back pain that started after lifting at work on Wednesday.  See HPI for further details.  Differential diagnosis includes, but is not limited to disc bulge, disc herniation, muscle strain, compression fracture  Patient's presentation is most consistent with acute illness / injury with system symptoms.  X-ray of the lumbar spine negative for acute concerns.  Patient requests CT scan for more detailed evaluation.  CT shows multilevel bulging disks without myelopathy or any other acute concerns.  Plan will be to treat him with prednisone , Flexeril , and Norco.  Patient instructed to follow-up with primary care or orthopedics.  He was also instructed to return to the emergency department for symptoms that change or worsen if unable schedule an appointment with orthopedics or primary care.  Medications  ketorolac  (TORADOL ) injection 30 mg (30 mg Intramuscular Given  11/09/23 1151)    Pertinent labs & imaging results that were available during my care of the patient were reviewed by me and considered in my medical decision making (see chart for details).   _________________________________________   FINAL CLINICAL IMPRESSION(S) / ED DIAGNOSES  Final diagnoses:  Bulging of lumbar intervertebral disc without  myelopathy     ED Discharge Orders          Ordered    predniSONE  (STERAPRED UNI-PAK 21 TAB) 10 MG (21) TBPK tablet        11/09/23 1333    cyclobenzaprine  (FLEXERIL ) 10 MG tablet  3 times daily PRN        11/09/23 1333    HYDROcodone -acetaminophen  (NORCO/VICODIN) 5-325 MG tablet  Every 6 hours PRN        11/09/23 1333             If controlled substance prescribed during this visit, 12 month history viewed on the [facility] prior to issuing an initial prescription for Schedule II or III opiod.    Sherryle Don, FNP 11/09/23 1341    Ruth Cove, MD 11/09/23 1435

## 2023-11-13 ENCOUNTER — Encounter: Payer: Self-pay | Admitting: Primary Care

## 2023-11-13 ENCOUNTER — Ambulatory Visit (INDEPENDENT_AMBULATORY_CARE_PROVIDER_SITE_OTHER): Admitting: Primary Care

## 2023-11-13 VITALS — BP 134/78 | HR 103 | Temp 97.8°F | Ht 71.0 in | Wt 287.0 lb

## 2023-11-13 DIAGNOSIS — M79672 Pain in left foot: Secondary | ICD-10-CM

## 2023-11-13 DIAGNOSIS — R600 Localized edema: Secondary | ICD-10-CM | POA: Diagnosis not present

## 2023-11-13 DIAGNOSIS — Z720 Tobacco use: Secondary | ICD-10-CM

## 2023-11-13 DIAGNOSIS — I872 Venous insufficiency (chronic) (peripheral): Secondary | ICD-10-CM

## 2023-11-13 DIAGNOSIS — R12 Heartburn: Secondary | ICD-10-CM

## 2023-11-13 DIAGNOSIS — M545 Low back pain, unspecified: Secondary | ICD-10-CM

## 2023-11-13 DIAGNOSIS — G8929 Other chronic pain: Secondary | ICD-10-CM

## 2023-11-13 DIAGNOSIS — J4540 Moderate persistent asthma, uncomplicated: Secondary | ICD-10-CM | POA: Diagnosis not present

## 2023-11-13 DIAGNOSIS — R002 Palpitations: Secondary | ICD-10-CM

## 2023-11-13 MED ORDER — OMEPRAZOLE 20 MG PO CPDR: 20.0000 mg | DELAYED_RELEASE_CAPSULE | Freq: Every day | ORAL | 0 refills | Status: AC

## 2023-11-13 NOTE — Assessment & Plan Note (Signed)
 Improved.  Commended him on cutting back smoking. Continue Dulera  200-5 mcg inhaler, 2 puffs twice daily. Continue Singulair  10 mg at bedtime, albuterol  inhaler as needed.

## 2023-11-13 NOTE — Assessment & Plan Note (Signed)
 With acute flare.  Reviewed ED notes from 11/09/2023. Given history of drug abuse would not recommend he continue narcotics.  Continue cyclobenzaprine  as needed.  Follow-up with orthopedics as scheduled.

## 2023-11-13 NOTE — Assessment & Plan Note (Signed)
 Waxes and wanes.  Follow-up with vascular service as scheduled. Continue furosemide  20 mg as needed for now.

## 2023-11-13 NOTE — Progress Notes (Signed)
 Subjective:    Patient ID: Marc Henderson, male    DOB: 05/03/1986, 38 y.o.   MRN: [id number]  HPI  Marc Henderson is a very pleasant 38 y.o. male with a history of asthma, chronic low back pain, drug abuse in remission, palpitations, lower extremity pain who presents today for follow-up of chronic conditions.  Evaluated by Melba Spittle, NP on 10/22/2023 for multiple symptoms including paresthesias and pain in the plantar feet, lower extremity swelling, palpitations, increased wheezing.  During this visit he was treated with prednisone  orally for asthma exacerbation.  A referral to pulmonology was placed for PFT/spirometry.  A Zio monitor was ordered due to palpitations.  Furosemide  was prescribed to use as needed for lower extremity swelling.  A referral to vascular services was placed for lower extremity swelling.  Labs were completed including A1c, CBC, TSH, CMP, lipids, B12.  All labs are grossly negative.  He was initiated on omeprazole  20 mg daily x 30 days for potential esophageal reflux.  Since his visit with Melba Spittle, NP he's completed the Zio Holter monitor. His asthma symptoms have improved. He's cut back on using the albuterol  inhaler. He has appointments for vascular services and pulmonlogy in May. He is compliant to omperazole 20 mg daily which has helped with heartburn. He has cut back on smoking, is smoking 2-3 cigarettes daily, has been using nicotine  patches. His foot pain has improved. He has been taking furosemide  20 mg PRN for 2-3 days with improvement.   Evaluated at [facility] ED on 11/09/23 for acute on chronic back pain. He underwent lumbar xray which was negative. He underwent CT lumbar spine which shows multiple levels of bulging disks without myelopathy. He has been referred to orthopedics and has an appointment. He denies loss of bowel/bladder control.  His back pain is about the same, little improvement with hydrocodone , cyclobenzaprine , prednisone .  BP Readings from Last 3  Encounters:  11/13/23 134/78  11/09/23 113/77  10/22/23 130/78      Review of Systems  Respiratory:  Negative for shortness of breath and wheezing.   Cardiovascular:  Positive for palpitations. Negative for chest pain.  Musculoskeletal:  Positive for back pain and myalgias.         Past Medical History:  Diagnosis Date   Alcohol abuse    Asthma    Chest wall pain    Current smoker    Insomnia    IV drug abuse (HCC)    Palpitation     Social History   Socioeconomic History   Marital status: Married    Spouse name: Not on file   Number of children: Not on file   Years of education: Not on file   Highest education level: Not on file  Occupational History   Not on file  Tobacco Use   Smoking status: Former    Current packs/day: 0.50    Types: Cigarettes   Smokeless tobacco: Never  Vaping Use   Vaping status: Former  Substance and Sexual Activity   Alcohol use: Not Currently    Comment: 1-2 beer/daily   Drug use: Not Currently    Comment: herion 3-4 times a week   Sexual activity: Yes  Other Topics Concern   Not on file  Social History Narrative   Not on file   Social Drivers of Health   Financial Resource Strain: Not on file  Food Insecurity: Not on file  Transportation Needs: Not on file  Physical Activity: Not on file  Stress: Not  on file  Social Connections: Not on file  Intimate Partner Violence: Not on file    History reviewed. No pertinent surgical history.  Family History  Problem Relation Age of Onset   Hypertension Mother    Alcohol abuse Mother    COPD Father    Heart disease Father     No Known Allergies  Current Outpatient Medications on File Prior to Visit  Medication Sig Dispense Refill   albuterol  (VENTOLIN  HFA) 108 (90 Base) MCG/ACT inhaler Inhale 2 puffs into the lungs every 6 (six) hours as needed for wheezing or shortness of breath. INHALE 2 PUFFS BY MOUTH EVERY 6 HOURS AS NEEDED FOR WHEEZE OR SHORTNESS OF BREATH 8 g 0    cyclobenzaprine  (FLEXERIL ) 10 MG tablet Take 1 tablet (10 mg total) by mouth 3 (three) times daily as needed. 30 tablet 0   furosemide  (LASIX ) 20 MG tablet Take one po every day prn edema 20 tablet 0   HYDROcodone -acetaminophen  (NORCO/VICODIN) 5-325 MG tablet Take 1 tablet by mouth every 6 (six) hours as needed for up to 4 days for severe pain (pain score 7-10). 16 tablet 0   mometasone -formoterol  (DULERA ) 200-5 MCG/ACT AERO Inhale 2 puffs into the lungs 2 (two) times daily. Rinse mouth after each use. 13 g 9   montelukast  (SINGULAIR ) 10 MG tablet Take 1 tablet (10 mg total) by mouth at bedtime. 30 tablet 3   nicotine  (NICODERM CQ  - DOSED IN MG/24 HOURS) 21 mg/24hr patch Place 1 patch (21 mg total) onto the skin daily. 28 patch 1   No current facility-administered medications on file prior to visit.    BP 134/78   Pulse (!) 103   Temp 97.8 F (36.6 C) (Temporal)   Ht 5\' 11"  (1.803 m)   Wt 287 lb (130.2 kg)   SpO2 95%   BMI 40.03 kg/m  Objective:   Physical Exam Cardiovascular:     Rate and Rhythm: Normal rate and regular rhythm.  Pulmonary:     Effort: Pulmonary effort is normal.     Breath sounds: Normal breath sounds.  Musculoskeletal:     Cervical back: Neck supple.  Skin:    General: Skin is warm and dry.  Neurological:     Mental Status: He is alert and oriented to person, place, and time.  Psychiatric:        Mood and Affect: Mood normal.           Assessment & Plan:  Chronic venous insufficiency of lower extremity Assessment & Plan: Follow-up with vascular service as scheduled. Will await office notes.   Heartburn Assessment & Plan: Improved.  Continue omeprazole  20 mg daily. Refills provided.  Orders: -     Omeprazole ; Take 1 capsule (20 mg total) by mouth daily. For heartburn  Dispense: 90 capsule; Refill: 0  Moderate persistent asthma, uncomplicated Assessment & Plan: Improved.  Commended him on cutting back smoking. Continue Dulera  200-5 mcg  inhaler, 2 puffs twice daily. Continue Singulair  10 mg at bedtime, albuterol  inhaler as needed.   Bilateral lower extremity edema Assessment & Plan: Waxes and wanes.  Follow-up with vascular service as scheduled. Continue furosemide  20 mg as needed for now.   Chronic low back pain without sciatica, unspecified back pain laterality Assessment & Plan: With acute flare.  Reviewed ED notes from 11/09/2023. Given history of drug abuse would not recommend he continue narcotics.  Continue cyclobenzaprine  as needed.  Follow-up with orthopedics as scheduled.   Pain in left foot Assessment &  Plan: Improved.  Continue to monitor.   Palpitations Assessment & Plan: Continue.  Await ZIO Holter monitor results. Commended him on cutting back smoking. Reviewed labs from 10/22/2023.   Tobacco use Assessment & Plan: Continue to work on cutting back. Continue nicotine  patches.         Eilene Voigt K Isaish Alemu, NP

## 2023-11-13 NOTE — Assessment & Plan Note (Signed)
 Continue to work on cutting back. Continue nicotine  patches.

## 2023-11-13 NOTE — Patient Instructions (Addendum)
 Follow-up with the orthopedist, vascular doctor, pulmonologist as scheduled.  We will be in touch once we receive the Holter monitor results.  It was a pleasure to see you today!

## 2023-11-13 NOTE — Assessment & Plan Note (Signed)
 Continue.  Await ZIO Holter monitor results. Commended him on cutting back smoking. Reviewed labs from 10/22/2023.

## 2023-11-13 NOTE — Assessment & Plan Note (Signed)
 Follow-up with vascular service as scheduled. Will await office notes.

## 2023-11-13 NOTE — Assessment & Plan Note (Signed)
 Improved. Continue to monitor.

## 2023-11-13 NOTE — Assessment & Plan Note (Signed)
 Improved.  Continue omeprazole  20 mg daily. Refills provided.

## 2023-11-21 ENCOUNTER — Ambulatory Visit: Admitting: Primary Care

## 2023-11-27 ENCOUNTER — Ambulatory Visit (INDEPENDENT_AMBULATORY_CARE_PROVIDER_SITE_OTHER): Admitting: Internal Medicine

## 2023-11-27 ENCOUNTER — Encounter: Payer: Self-pay | Admitting: Internal Medicine

## 2023-11-27 VITALS — BP 118/80 | HR 88 | Temp 97.9°F | Ht 71.0 in | Wt 285.6 lb

## 2023-11-27 DIAGNOSIS — F17210 Nicotine dependence, cigarettes, uncomplicated: Secondary | ICD-10-CM | POA: Diagnosis not present

## 2023-11-27 DIAGNOSIS — R5381 Other malaise: Secondary | ICD-10-CM | POA: Diagnosis not present

## 2023-11-27 DIAGNOSIS — E669 Obesity, unspecified: Secondary | ICD-10-CM

## 2023-11-27 DIAGNOSIS — Z6839 Body mass index (BMI) 39.0-39.9, adult: Secondary | ICD-10-CM

## 2023-11-27 DIAGNOSIS — R0602 Shortness of breath: Secondary | ICD-10-CM

## 2023-11-27 DIAGNOSIS — J4520 Mild intermittent asthma, uncomplicated: Secondary | ICD-10-CM | POA: Diagnosis not present

## 2023-11-27 NOTE — Patient Instructions (Signed)
 PLEASE STOP SMOKING!  Avoid Allergens and Irritants Avoid secondhand smoke Avoid SICK contacts Recommend  Masking  when appropriate Recommend Keep up-to-date with vaccinations  Please continue with Dulera  2 puffs in the morning 2 puffs at night Recommend rinsing mouth out after every use Continue to use albuterol  as needed  Recommend weight loss

## 2023-11-27 NOTE — Progress Notes (Signed)
 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 When he is exposed to triggers his symptoms include cough shortness of breath lightheadedness and wheezing No recent exacerbations in the last 6 months no intubations noted  Previous history of drug abuse no alcohol use at this time Patient continues to smoke 1/2 pack/day  Patient currently is on Dulera  2 puffs twice daily This seems to be helping his symptoms Patient uses albuterol  as needed   Assessment of ASTHMA FeNO  5  ppb-not  consistent with type II inflammation  No exacerbation at this time No evidence of heart failure at this time No evidence or signs of infection at this time No respiratory distress No fevers, chills, nausea, vomiting, diarrhea No evidence of lower extremity edema No evidence hemoptysis  PAST MEDICAL HISTORY   Past Medical History:  Diagnosis Date   Alcohol abuse    Asthma    Chest wall pain    Current smoker    Insomnia    IV drug abuse (HCC)    Palpitation      SURGICAL HISTORY   History reviewed. No pertinent surgical history.   FAMILY HISTORY   Family History  Problem Relation Age of Onset   Hypertension Mother    Alcohol abuse Mother    COPD Father    Heart disease Father      SOCIAL HISTORY   Social History   Tobacco Use   Smoking status: Former    Current packs/day: 0.50    Types: Cigarettes   Smokeless tobacco: Never  Vaping Use   Vaping status: Former  Substance Use Topics   Alcohol use: Not Currently    Comment: 1-2 beer/daily   Drug use: Not Currently    Comment: herion 3-4 times a week     MEDICATIONS    Home Medication:  Current  Outpatient Rx   Order #: [id number] Class: Normal   Order #: [id number] Class: Normal   Order #: [id number] Class: Normal   Order #: [id number] Class: Normal   Order #: [id number] Class: Normal   Order #: [id number] Class: Normal   Order #: [id number] Class: Normal    Current Medication:  Current Outpatient Medications:    albuterol  (VENTOLIN  HFA) 108 (90 Base) MCG/ACT inhaler, Inhale 2 puffs into the lungs every 6 (six) hours as needed for wheezing or shortness of breath. INHALE 2 PUFFS BY MOUTH EVERY 6 HOURS AS NEEDED FOR WHEEZE OR SHORTNESS OF BREATH, Disp: 8 g, Rfl: 0   cyclobenzaprine  (FLEXERIL ) 10 MG tablet, Take 1 tablet (10 mg total) by mouth 3 (three) times daily as needed., Disp: 30 tablet, Rfl: 0   furosemide  (LASIX ) 20 MG tablet, Take one po every day prn edema, Disp: 20 tablet, Rfl: 0   mometasone -formoterol  (DULERA ) 200-5 MCG/ACT AERO, Inhale 2 puffs into the lungs 2 (two) times daily. Rinse mouth after each use., Disp: 13 g, Rfl: 9   montelukast  (SINGULAIR ) 10 MG tablet, Take 1 tablet (10 mg total) by mouth at bedtime., Disp: 30 tablet, Rfl: 3   nicotine  (NICODERM CQ  - DOSED IN MG/24 HOURS) 21 mg/24hr patch, Place 1 patch (21 mg total) onto the skin daily., Disp: 28 patch, Rfl: 1  omeprazole  (PRILOSEC) 20 MG capsule, Take 1 capsule (20 mg total) by mouth daily. For heartburn, Disp: 90 capsule, Rfl: 0    ALLERGIES   Patient has no known allergies.  BP 118/80 (BP Location: Right Arm, Patient Position: Sitting, Cuff Size: Normal)   Pulse 88   Temp 97.9 F (36.6 C) (Oral)   Ht 5\' 11"  (1.803 m)   Wt 285 lb 9.6 oz (129.5 kg)   SpO2 98%   BMI 39.83 kg/m    Review of Systems: Gen:  Denies  fever, sweats, chills weight loss  HEENT: Denies blurred vision, double vision, ear pain, eye pain, hearing loss, nose bleeds, sore throat Cardiac:  No dizziness, chest pain or heaviness, chest tightness,edema, No JVD Resp:   No cough, -sputum production, -shortness of breath,-wheezing,  -hemoptysis,  Other:  All other systems negative   Physical Examination:   General Appearance: No distress  EYES PERRLA, EOM intact.   NECK Supple, No JVD Pulmonary: normal breath sounds, No wheezing.  CardiovascularNormal S1,S2.  No m/r/g.   Abdomen: Benign, Soft, non-tender. Neurology UE/LE 5/5 strength, no focal deficits Ext pulses intact, cap refill intact ALL OTHER ROS ARE NEGATIVE      IMAGING    CT Lumbar Spine Wo Contrast Result Date: 11/09/2023 CLINICAL DATA:  Low back pain. Increased fracture risk. Lifting injury 3 days ago. EXAM: CT LUMBAR SPINE WITHOUT CONTRAST TECHNIQUE: Multidetector CT imaging of the lumbar spine was performed without intravenous contrast administration. Multiplanar CT image reconstructions were also generated. RADIATION DOSE REDUCTION: This exam was performed according to the departmental dose-optimization program which includes automated exposure control, adjustment of the mA and/or kV according to patient size and/or use of iterative reconstruction technique. COMPARISON:  Radiography earlier same day and 07/03/2023. FINDINGS: Segmentation: 5 lumbar type vertebral bodies. Alignment: Normal Vertebrae: No regional fracture. Chronic appearing endplate Schmorl's nodes from T11-12 through L4-5. No other focal bone finding. Paraspinal and other soft tissues: Negative Disc levels: No significant disc space finding at T12-L1 or T11-12. L1-2: Endplate osteophytes and chronic appearing protrusion of the disc. Moderate stenosis at this level could be symptomatic. L2-3: Mild bulging of the disc. Mild narrowing of the lateral recesses, not likely compressive. L3-4: Mild bulging of the disc. Mild narrowing of the lateral recesses, not likely compressive. L4-5: Shallow protrusion of the disc slightly more prominent towards the right. Mild narrowing of the lateral recesses right more than left. Definite neural compression is not demonstrated however. Mild facet osteoarthritis  at this level, right more than left. L5-S1: No significant disc finding. Mild bilateral facet osteoarthritis. No stenosis. Sacroiliac joints show mild vacuum phenomenon but no other degenerative finding. IMPRESSION: 1. No acute or traumatic finding. Chronic appearing endplate Schmorl's nodes from T11-12 through L4-5. 2. L1-2: Endplate osteophytes and chronic appearing protrusion of the disc. Moderate stenosis at this level could be symptomatic. 3. L2-3: Mild bulging of the disc. Mild narrowing of the lateral recesses, not likely compressive. 4. L3-4: Mild bulging of the disc. Mild narrowing of the lateral recesses, not likely compressive. 5. L4-5: Shallow protrusion of the disc slightly more prominent towards the right. Mild narrowing of the lateral recesses right more than left. Definite neural compression is not demonstrated however. Mild facet osteoarthritis at this level, right more than left. 6. L5-S1: Mild bilateral facet osteoarthritis. No stenosis. Electronically Signed   By: Bettylou Brunner M.D.   On: 11/09/2023 13:13   DG Lumbar Spine 2-3 Views Result Date: 11/09/2023 CLINICAL DATA:  Lumbar region  pain after lifting injury 3 days ago. EXAM: LUMBAR SPINE - 2-3 VIEW COMPARISON:  07/03/2023 FINDINGS: No acute finding. Five lumbar type vertebral bodies show normal alignment. Small lower thoracic and upper lumbar region endplate Schmorl's nodes appear the same. No evidence of recent fracture. No significant disc space narrowing. Regional soft tissues appear unremarkable. IMPRESSION: No acute or traumatic finding. Small lower thoracic and upper lumbar region endplate Schmorl's nodes appear the same. Electronically Signed   By: Bettylou Brunner M.D.   On: 11/09/2023 12:02      ASSESSMENT/PLAN   38 year old pleasant white male seen today for assessment of asthma in the setting of morbid obesity and deconditioned state with a history of reflux  Assessment of asthma No exacerbation at this time No evidence of  heart failure at this time No evidence or signs of infection at this time No respiratory distress No fevers, chills, nausea, vomiting, diarrhea No evidence of lower extremity edema No evidence hemoptysis Avoid Allergens and Irritants Avoid secondhand smoke Avoid SICK contacts Recommend  Masking  when appropriate Recommend Keep up-to-date with vaccinations Mild intermittent at this time well-controlled with Dulera  2 puffs in a.m. 2 puffs in p.m. Rinse mouth after use Continue to use albuterol  as needed   Obesity -recommend significant weight loss -recommend changing diet  Deconditioned state -Recommend increased daily activity and exercise     CURRENT MEDICATIONS REVIEWED AT LENGTH WITH PATIENT TODAY   Patient  satisfied with Plan of action and management. All questions answered  Follow up 6 months  I spent a total of 65 minutes reviewing chart data, face-to-face evaluation with the patient, counseling and coordination of care as detailed above.     Lady Pier, M.D.  [facility]  Medical Director [facility] Medical Director [facility] Cardio-Pulmonary Department

## 2023-12-03 ENCOUNTER — Other Ambulatory Visit (INDEPENDENT_AMBULATORY_CARE_PROVIDER_SITE_OTHER): Payer: Self-pay | Admitting: Nurse Practitioner

## 2023-12-03 DIAGNOSIS — I872 Venous insufficiency (chronic) (peripheral): Secondary | ICD-10-CM

## 2023-12-04 ENCOUNTER — Encounter (INDEPENDENT_AMBULATORY_CARE_PROVIDER_SITE_OTHER): Payer: Self-pay | Admitting: Nurse Practitioner

## 2023-12-04 ENCOUNTER — Ambulatory Visit (INDEPENDENT_AMBULATORY_CARE_PROVIDER_SITE_OTHER): Payer: Self-pay

## 2023-12-04 ENCOUNTER — Ambulatory Visit (INDEPENDENT_AMBULATORY_CARE_PROVIDER_SITE_OTHER): Payer: Self-pay | Admitting: Nurse Practitioner

## 2023-12-04 VITALS — BP 115/73 | HR 103 | Resp 18 | Ht 71.0 in | Wt 285.0 lb

## 2023-12-04 DIAGNOSIS — I872 Venous insufficiency (chronic) (peripheral): Secondary | ICD-10-CM

## 2023-12-04 DIAGNOSIS — R002 Palpitations: Secondary | ICD-10-CM

## 2023-12-04 DIAGNOSIS — R600 Localized edema: Secondary | ICD-10-CM

## 2023-12-04 NOTE — Progress Notes (Signed)
 Subjective:    Patient ID: Marc Henderson, male    DOB: 12/06/1985, 38 y.o.   MRN: [id number] Chief Complaint  Patient presents with   New Patient (Initial Visit)    Consult Dugal le dermatitis, pain, and neuropathy, edema    The patient is a 38 year old male who presents today for evaluation of swelling in his bilateral lower extremities.  He notes that this is started about 5 years ago.  He notes that initially started as more fluid retention.  He also started to develop some stasis dermatitis which is also been worsening over the last several years.  He notes that he takes Lasix  for a couple of days when it becomes uncomfortable for him.  He has worn compression but not on a consistent basis.  He also has some tenderness with numbness and stinging in his feet but he also does endorse having a history of some lower back issues.  Today the patient underwent noninvasive studies which shows no evidence of DVT or superficial thrombophlebitis bilaterally.  No evidence of deep venous insufficiency or superficial venous reflux noted bilaterally.    Review of Systems  Cardiovascular:  Positive for leg swelling.  Musculoskeletal:  Positive for arthralgias and back pain.  All other systems reviewed and are negative.      Objective:    Physical Exam Vitals reviewed.  HENT:     Head: Normocephalic.  Cardiovascular:     Rate and Rhythm: Normal rate.  Pulmonary:     Effort: Pulmonary effort is normal.  Musculoskeletal:     Right lower leg: 2+ Edema present.     Left lower leg: 2+ Edema present.  Skin:    General: Skin is warm and dry.  Neurological:     Mental Status: He is alert and oriented to person, place, and time.  Psychiatric:        Mood and Affect: Mood normal.        Behavior: Behavior normal.        Thought Content: Thought content normal.        Judgment: Judgment normal.     BP 115/73   Pulse (!) 103   Resp 18   Ht 5\' 11"  (1.803 m)   Wt 285 lb (129.3 kg)   BMI  39.75 kg/m   Past Medical History:  Diagnosis Date   Alcohol abuse    Asthma    Chest wall pain    Current smoker    Insomnia    IV drug abuse (HCC)    Palpitation     Social History   Socioeconomic History   Marital status: Married    Spouse name: Not on file   Number of children: Not on file   Years of education: Not on file   Highest education level: Not on file  Occupational History   Not on file  Tobacco Use   Smoking status: Every Day    Current packs/day: 0.25    Average packs/day: 0.3 packs/day for 20.4 years (5.1 ttl pk-yrs)    Types: Cigarettes    Start date: 2005   Smokeless tobacco: Never   Tobacco comments:    Cut back to 1/4 ppd (11/27/23)  Vaping Use   Vaping status: Former  Substance and Sexual Activity   Alcohol use: Not Currently    Comment: 1-2 beer/daily   Drug use: Not Currently    Comment: herion 3-4 times a week   Sexual activity: Yes  Other Topics Concern  Not on file  Social History Narrative   Not on file   Social Drivers of Health   Financial Resource Strain: Not on file  Food Insecurity: Not on file  Transportation Needs: Not on file  Physical Activity: Not on file  Stress: Not on file  Social Connections: Not on file  Intimate Partner Violence: Not on file    History reviewed. No pertinent surgical history.  Family History  Problem Relation Age of Onset   Hypertension Mother    Alcohol abuse Mother    COPD Father    Heart disease Father     Allergies  Allergen Reactions   Other Shortness Of Breath    Housing insulation   Dust Mite Extract Cough       Latest Ref Rng & Units 10/22/2023   10:11 AM 09/17/2022   12:53 PM 05/08/2022    4:34 PM  CBC  WBC 4.0 - 10.5 K/uL 7.6  5.5  5.7   Hemoglobin 13.0 - 17.0 g/dL 16.1  09.6  04.5   Hematocrit 39.0 - 52.0 % 44.2  44.8  39.3   Platelets 150.0 - 400.0 K/uL 350.0  297  322.0        CMP     Component Value Date/Time   NA 138 10/22/2023 1011   NA 141 09/17/2022  1253   NA 138 02/16/2014 1201   K 4.1 10/22/2023 1011   K 4.1 02/16/2014 1201   CL 102 10/22/2023 1011   CL 104 02/16/2014 1201   CO2 28 10/22/2023 1011   CO2 27 02/16/2014 1201   GLUCOSE 106 (H) 10/22/2023 1011   GLUCOSE 108 (H) 02/16/2014 1201   BUN 13 10/22/2023 1011   BUN 11 09/17/2022 1253   BUN 9 02/16/2014 1201   CREATININE 1.04 10/22/2023 1011   CREATININE 0.98 02/16/2014 1201   CALCIUM 9.3 10/22/2023 1011   CALCIUM 9.3 02/16/2014 1201   PROT 7.0 10/22/2023 1011   PROT 7.1 09/17/2022 1253   PROT 8.1 02/16/2014 1201   ALBUMIN 4.3 10/22/2023 1011   ALBUMIN 4.6 09/17/2022 1253   ALBUMIN 4.2 02/16/2014 1201   AST 15 10/22/2023 1011   AST 21 02/16/2014 1201   ALT 19 10/22/2023 1011   ALT 37 02/16/2014 1201   ALKPHOS 72 10/22/2023 1011   ALKPHOS 63 02/16/2014 1201   BILITOT 0.3 10/22/2023 1011   BILITOT 0.3 09/17/2022 1253   BILITOT 0.5 02/16/2014 1201   GFR 92.67 10/22/2023 1011   EGFR 112 09/17/2022 1253   GFRNONAA >60 01/11/2022 1642   GFRNONAA >60 02/16/2014 1201     No results found.     Assessment & Plan:   1. Bilateral lower extremity edema (Primary) Today the patient's noninvasive study showed no evidence of DVT or superficial phlebitis bilaterally.  There is no significant venous insufficiency.  Based on his studies I suspect the patient has underlying lymphedema.  We have discussed lymphedema treatment of symptoms including use of medical grade compression stockings daily.  We discussed that consistency will help with the greatest improvement.  He should utilize 20 to 30 mmHg on a daily basis.  Patient also elevate his lower extremities when daily.  Also advised to try to get 15 to 20 minutes of activity daily.  He has a commercial leg pump purchased from [employer].  Based on his description this may be helpful with controlling his swelling.  We discussed several options for use.  We will have the patient engage in these conservative therapies  for 3 months.  At  that time we will have him return to discuss if a more formal lymphedema pump may be necessary.   Current Outpatient Medications on File Prior to Visit  Medication Sig Dispense Refill   albuterol  (VENTOLIN  HFA) 108 (90 Base) MCG/ACT inhaler Inhale 2 puffs into the lungs every 6 (six) hours as needed for wheezing or shortness of breath. INHALE 2 PUFFS BY MOUTH EVERY 6 HOURS AS NEEDED FOR WHEEZE OR SHORTNESS OF BREATH 8 g 0   cyclobenzaprine  (FLEXERIL ) 10 MG tablet Take 1 tablet (10 mg total) by mouth 3 (three) times daily as needed. 30 tablet 0   furosemide  (LASIX ) 20 MG tablet Take one po every day prn edema 20 tablet 0   mometasone -formoterol  (DULERA ) 200-5 MCG/ACT AERO Inhale 2 puffs into the lungs 2 (two) times daily. Rinse mouth after each use. 13 g 9   montelukast  (SINGULAIR ) 10 MG tablet Take 1 tablet (10 mg total) by mouth at bedtime. 30 tablet 3   nicotine  (NICODERM CQ  - DOSED IN MG/24 HOURS) 21 mg/24hr patch Place 1 patch (21 mg total) onto the skin daily. 28 patch 1   omeprazole  (PRILOSEC) 20 MG capsule Take 1 capsule (20 mg total) by mouth daily. For heartburn 90 capsule 0   No current facility-administered medications on file prior to visit.    There are no Patient Instructions on file for this visit. No follow-ups on file.   Donnalee Cellucci E Baylon Santelli, NP

## 2023-12-05 NOTE — Telephone Encounter (Signed)
 How do we get his Holter monitor read? It was ordered on 10/22/23.

## 2023-12-06 NOTE — Telephone Encounter (Signed)
 Called and spoke with holter monitor company, the monitor was missing a piece of information preventing it from being fully processed. This has been corrected now and is being processed. Advised report will be ready within 48 hours.

## 2023-12-09 NOTE — Telephone Encounter (Signed)
 Noted

## 2023-12-10 ENCOUNTER — Encounter (INDEPENDENT_AMBULATORY_CARE_PROVIDER_SITE_OTHER): Payer: Self-pay

## 2023-12-12 DIAGNOSIS — R002 Palpitations: Secondary | ICD-10-CM | POA: Diagnosis not present

## 2023-12-15 MED ORDER — METOPROLOL SUCCINATE ER 25 MG PO TB24: 25.0000 mg | ORAL_TABLET | Freq: Every day | ORAL | 0 refills | Status: DC

## 2023-12-17 ENCOUNTER — Other Ambulatory Visit: Payer: Self-pay | Admitting: Internal Medicine

## 2023-12-17 DIAGNOSIS — J4540 Moderate persistent asthma, uncomplicated: Secondary | ICD-10-CM

## 2023-12-17 MED ORDER — ALBUTEROL SULFATE HFA 108 (90 BASE) MCG/ACT IN AERS: 2.0000 | INHALATION_SPRAY | Freq: Four times a day (QID) | RESPIRATORY_TRACT | 10 refills | Status: AC | PRN

## 2023-12-17 NOTE — Telephone Encounter (Signed)
 Copied from CRM [id number]. Topic: Clinical - Medication Refill >> Dec 17, 2023  1:41 PM Margarette Shawl wrote: Medication: albuterol  (VENTOLIN  HFA) 108 (90 Base) MCG/ACT inhaler   Has the patient contacted their pharmacy? No (Agent: If no, request that the patient contact the pharmacy for the refill. If patient does not wish to contact the pharmacy document the reason why and proceed with request.) (Agent: If yes, when and what did the pharmacy advise?)  This is the patient's preferred pharmacy:  [facility] [phone] [facility] [place] [place] - [address] [place] [address] Phone: [phone] Fax: [phone]  Is this the correct pharmacy for this prescription? Yes If no, delete pharmacy and type the correct one.   Has the prescription been filled recently? Yes  Is the patient out of the medication? No  Has the patient been seen for an appointment in the last year OR does the patient have an upcoming appointment? Yes, 05/07 w/[facility]  Can we respond through MyChart? Yes  Agent: Please be advised that Rx refills may take up to 3 business days. We ask that you follow-up with your pharmacy.

## 2023-12-21 DIAGNOSIS — R002 Palpitations: Secondary | ICD-10-CM

## 2023-12-30 ENCOUNTER — Ambulatory Visit: Payer: Self-pay | Admitting: Family

## 2024-01-14 ENCOUNTER — Encounter: Payer: Self-pay | Admitting: Primary Care

## 2024-01-14 ENCOUNTER — Ambulatory Visit (INDEPENDENT_AMBULATORY_CARE_PROVIDER_SITE_OTHER): Admitting: Primary Care

## 2024-01-14 VITALS — BP 118/76 | HR 106 | Temp 98.4°F | Ht 71.0 in | Wt 280.0 lb

## 2024-01-14 DIAGNOSIS — G8929 Other chronic pain: Secondary | ICD-10-CM

## 2024-01-14 DIAGNOSIS — M5442 Lumbago with sciatica, left side: Secondary | ICD-10-CM

## 2024-01-14 DIAGNOSIS — M5441 Lumbago with sciatica, right side: Secondary | ICD-10-CM

## 2024-01-14 MED ORDER — CYCLOBENZAPRINE HCL 10 MG PO TABS: 10.0000 mg | ORAL_TABLET | Freq: Three times a day (TID) | ORAL | 0 refills | Status: AC | PRN

## 2024-01-14 NOTE — Progress Notes (Signed)
 Subjective:    Patient ID: Marc Henderson, male    DOB: Sep 03, 1985, 38 y.o.   MRN: [id number]  Back Pain Pertinent negatives include no numbness.    Marc Henderson is a very pleasant 38 y.o. male with a history of chronic venous insufficiency, moderate persistent asthma, chronic low back pain, chronic lower extremity edema, drug abuse, lower extremity pain who presents today to discuss back pain.  His back pain is located to the right lower back with radiation to left lower back and down through the hips and thighs. Chronic history of lower back pain, acute symptoms began a few weeks ago, worse yesterday after crawling under a house. Symptoms are worse with standing and walking, improved with sitting and resting.   Following with vascular services, evaluated on 12/04/2023 for lower extremity pain and edema.  During this visit he was negative for DVT and superficial phlebitis.  He was advised to elevate his extremities when resting, increase physical activity, utilize commercial leg pump.  He underwent CT lumbar spine in April 2025 which showed chronic osteophytes mild disc protrusion, moderate stenosis. He had an appointment to orthopedics, did not attend the appointment. He has not completed physical therapy.   He has been taking Ibuprofen  and cyclobenzaprine  PRN which does help temporarily. He denies loss of bowel/bladder control. He is needing a work note for today and tomorrow. He plans on returning Thursday.    Review of Systems  Genitourinary:        No loss of bowel/bladder control  Musculoskeletal:  Positive for arthralgias and back pain.  Neurological:  Negative for numbness.         Past Medical History:  Diagnosis Date   Alcohol abuse    Asthma    Chest wall pain    Current smoker    Insomnia    IV drug abuse (HCC)    Palpitation     Social History   Socioeconomic History   Marital status: Married    Spouse name: Not on file   Number of children: Not on  file   Years of education: Not on file   Highest education level: Not on file  Occupational History   Not on file  Tobacco Use   Smoking status: Every Day    Current packs/day: 0.25    Average packs/day: 0.3 packs/day for 20.5 years (5.1 ttl pk-yrs)    Types: Cigarettes    Start date: 2005   Smokeless tobacco: Never   Tobacco comments:    Cut back to 1/4 ppd (11/27/23)  Vaping Use   Vaping status: Former  Substance and Sexual Activity   Alcohol use: Not Currently    Comment: 1-2 beer/daily   Drug use: Not Currently    Comment: herion 3-4 times a week   Sexual activity: Yes  Other Topics Concern   Not on file  Social History Narrative   Not on file   Social Drivers of Health   Financial Resource Strain: Not on file  Food Insecurity: Not on file  Transportation Needs: Not on file  Physical Activity: Not on file  Stress: Not on file  Social Connections: Not on file  Intimate Partner Violence: Not on file    History reviewed. No pertinent surgical history.  Family History  Problem Relation Age of Onset   Hypertension Mother    Alcohol abuse Mother    COPD Father    Heart disease Father     Allergies  Allergen Reactions  Other Shortness Of Breath    Housing insulation   Dust Mite Extract Cough    Current Outpatient Medications on File Prior to Visit  Medication Sig Dispense Refill   albuterol  (VENTOLIN  HFA) 108 (90 Base) MCG/ACT inhaler Inhale 2 puffs into the lungs every 6 (six) hours as needed for wheezing or shortness of breath. 8 g 10   furosemide  (LASIX ) 20 MG tablet Take one po every day prn edema 20 tablet 0   metoprolol  succinate (TOPROL -XL) 25 MG 24 hr tablet Take 1 tablet (25 mg total) by mouth daily. For palpitations 90 tablet 0   mometasone -formoterol  (DULERA ) 200-5 MCG/ACT AERO Inhale 2 puffs into the lungs 2 (two) times daily. Rinse mouth after each use. 13 g 9   omeprazole  (PRILOSEC) 20 MG capsule Take 1 capsule (20 mg total) by mouth daily. For  heartburn 90 capsule 0   montelukast  (SINGULAIR ) 10 MG tablet Take 1 tablet (10 mg total) by mouth at bedtime. (Patient not taking: Reported on 01/14/2024) 30 tablet 3   nicotine  (NICODERM CQ  - DOSED IN MG/24 HOURS) 21 mg/24hr patch Place 1 patch (21 mg total) onto the skin daily. (Patient not taking: Reported on 01/14/2024) 28 patch 1   No current facility-administered medications on file prior to visit.    BP 118/76   Pulse (!) 106   Temp 98.4 F (36.9 C) (Temporal)   Ht 5' 11 (1.803 m)   Wt 280 lb (127 kg)   SpO2 97%   BMI 39.05 kg/m  Objective:   Physical Exam  Cardiovascular:     Rate and Rhythm: Normal rate.  Pulmonary:     Effort: Pulmonary effort is normal.   Musculoskeletal:     Cervical back: Neck supple.     Lumbar back: No tenderness or bony tenderness. Decreased range of motion. Negative right straight leg raise test and negative left straight leg raise test.   Skin:    General: Skin is warm and dry.   Neurological:     Mental Status: He is alert and oriented to person, place, and time.   Psychiatric:        Mood and Affect: Mood normal.           Assessment & Plan:  Chronic bilateral low back pain with bilateral sciatica Assessment & Plan: Reviewed CT lumbar spine from April 2025.  Referral placed to orthopedics for further evaluation. Refills provided for cyclobenzaprine  to use as needed.  Drowsiness precautions provided. Continue ibuprofen  as needed.  No alarm signs on exam or HPI.  Orders: -     Ambulatory referral to Orthopedic Surgery -     Cyclobenzaprine  HCl; Take 1 tablet (10 mg total) by mouth 3 (three) times daily as needed for muscle spasms.  Dispense: 30 tablet; Refill: 0        Comer MARLA Gaskins, NP

## 2024-01-14 NOTE — Assessment & Plan Note (Signed)
 Reviewed CT lumbar spine from April 2025.  Referral placed to orthopedics for further evaluation. Refills provided for cyclobenzaprine  to use as needed.  Drowsiness precautions provided. Continue ibuprofen  as needed.  No alarm signs on exam or HPI.

## 2024-01-14 NOTE — Patient Instructions (Signed)
 You may take the cyclobenzaprine  muscle relaxer as needed.  Remember that can cause drowsiness.  Continue ibuprofen  as needed.  Take with food.  You will either be contacted via phone regarding your referral to orthopedics, or you may receive a letter on your MyChart portal from our referral team with instructions for scheduling an appointment. Please let us  know if you have not been contacted by anyone within two weeks.  It was a pleasure to see you today!

## 2024-01-17 ENCOUNTER — Other Ambulatory Visit: Payer: Self-pay | Admitting: Family

## 2024-01-17 DIAGNOSIS — J4541 Moderate persistent asthma with (acute) exacerbation: Secondary | ICD-10-CM

## 2024-01-18 ENCOUNTER — Emergency Department
Admission: EM | Admit: 2024-01-18 | Discharge: 2024-01-18 | Disposition: A | Discharge status: Home / Self Care | Attending: Emergency Medicine | Admitting: Emergency Medicine

## 2024-01-18 ENCOUNTER — Emergency Department

## 2024-01-18 ENCOUNTER — Other Ambulatory Visit: Payer: Self-pay

## 2024-01-18 DIAGNOSIS — G43909 Migraine, unspecified, not intractable, without status migrainosus: Secondary | ICD-10-CM | POA: Diagnosis not present

## 2024-01-18 DIAGNOSIS — R519 Headache, unspecified: Secondary | ICD-10-CM | POA: Insufficient documentation

## 2024-01-18 LAB — CBC
HCT: 41.1 % (ref 39.0–52.0)
Hemoglobin: 13.9 g/dL (ref 13.0–17.0)
MCH: 28.7 pg (ref 26.0–34.0)
MCHC: 33.8 g/dL (ref 30.0–36.0)
MCV: 84.7 fL (ref 80.0–100.0)
Platelets: 283 10*3/uL (ref 150–400)
RBC: 4.85 MIL/uL (ref 4.22–5.81)
RDW: 12.4 % (ref 11.5–15.5)
WBC: 6.8 10*3/uL (ref 4.0–10.5)
nRBC: 0 % (ref 0.0–0.2)

## 2024-01-18 LAB — BASIC METABOLIC PANEL WITH GFR
Anion gap: 10 (ref 5–15)
BUN: 17 mg/dL (ref 6–20)
CO2: 25 mmol/L (ref 22–32)
Calcium: 8.8 mg/dL — ABNORMAL LOW (ref 8.9–10.3)
Chloride: 103 mmol/L (ref 98–111)
Creatinine, Ser: 0.99 mg/dL (ref 0.61–1.24)
GFR, Estimated: >60 mL/min (ref >60)
Glucose, Bld: 128 mg/dL — ABNORMAL HIGH (ref 70–99)
Potassium: 4.1 mmol/L (ref 3.5–5.1)
Sodium: 138 mmol/L (ref 135–145)

## 2024-01-18 LAB — URINALYSIS, ROUTINE W REFLEX MICROSCOPIC
Bilirubin Urine: NEGATIVE
Glucose, UA: NEGATIVE mg/dL
Hgb urine dipstick: NEGATIVE
Ketones, ur: NEGATIVE mg/dL
Nitrite: NEGATIVE
Protein, ur: NEGATIVE mg/dL
Specific Gravity, Urine: 1.027 (ref 1.005–1.030)
Squamous Epithelial / HPF: 0 /HPF (ref 0–5)
pH: 5 (ref 5.0–8.0)

## 2024-01-18 MED ORDER — IOHEXOL 350 MG/ML SOLN
75.0000 mL | Freq: Once | INTRAVENOUS | Status: AC | PRN
  Administered 2024-01-18: 75 mL via INTRAVENOUS

## 2024-01-18 NOTE — ED Provider Notes (Signed)
 [facility] Provider Note    Event Date/Time   First MD Initiated Contact with Patient 01/18/24 0932     (approximate)   History   Headache   HPI  Marc Henderson is a 38 y.o. male who presents with complaints of intermittent headache.  He also reports occasional tingling in his hands.  He does not know if these are related to headaches or if they occur when he has headaches.  No difficulty ambulating     Physical Exam   Triage Vital Signs: ED Triage Vitals [01/18/24 0920]  Encounter Vitals Group     BP (!) 135/7     Girls Systolic BP Percentile      Girls Diastolic BP Percentile      Boys Systolic BP Percentile      Boys Diastolic BP Percentile      Pulse Rate (!) 108     Resp 19     Temp 98.3 F (36.8 C)     Temp Source Oral     SpO2 98 %     Weight 127 kg (279 lb 15.8 oz)     Height 1.803 m (5' 11)     Head Circumference      Peak Flow      Pain Score 0     Pain Loc      Pain Education      Exclude from Growth Chart     Most recent vital signs: Vitals:   01/18/24 0921 01/18/24 1100  BP: 135/78 122/73  Pulse:  100  Resp:  17  Temp:    SpO2:  93%     General: Awake, no distress.  CV:  Good peripheral perfusion.  Resp:  Normal effort.  Abd:  No distention.  Other:  Cranial nerves II through XII are normal, ambulates normally, normal strength in all extremities, PERRLA, EOMI   ED Results / Procedures / Treatments   Labs (all labs ordered are listed, but only abnormal results are displayed) Labs Reviewed  BASIC METABOLIC PANEL WITH GFR - Abnormal; Notable for the following components:      Result Value   Glucose, Bld 128 (*)    Calcium 8.8 (*)    All other components within normal limits  URINALYSIS, ROUTINE W REFLEX MICROSCOPIC - Abnormal; Notable for the following components:   Color, Urine YELLOW (*)    APPearance CLEAR (*)    Leukocytes,Ua TRACE (*)    Bacteria, UA RARE (*)    All other components within  normal limits  CBC     EKG     RADIOLOGY CT head reviewed, interpreted by me, no acute abnormality, confirmed by radiology    PROCEDURES:  Critical Care performed:   Procedures   MEDICATIONS ORDERED IN ED: Medications  iohexol  (OMNIPAQUE ) 350 MG/ML injection 75 mL (75 mLs Intravenous Contrast Given 01/18/24 1046)     IMPRESSION / MDM / ASSESSMENT AND PLAN / ED COURSE  I reviewed the triage vital signs and the nursing notes. Patient's presentation is most consistent with acute presentation with potential threat to life or bodily function.  Patient presents with symptoms as above, differential includes migraine, nonspecific headache, ICH, tumor  CT scan is reassuring, discussed with patient at length, he would like to proceed with CT angiography to rule out aneurysm  CT angiography is negative for acute abnormality, patient is reassured by this, appropriate for discharge at this time with follow-up with neurology, strict return precautions to the  emergency department as needed.      FINAL CLINICAL IMPRESSION(S) / ED DIAGNOSES   Final diagnoses:  Headache disorder     Rx / DC Orders   ED Discharge Orders     None        Note:  This document was prepared using Dragon voice recognition software and may include unintentional dictation errors.   Arlander Charleston, MD 01/18/24 [phone]

## 2024-01-18 NOTE — ED Triage Notes (Signed)
 Pt here with head pain. Pt states he gets these sharp shooting pains in his head and then his hands get tingly but then it resolves. Pt states at times his vision also gets blurry and then resolves but now it feels it more. Pt denies any speech or gait issues, endorses some dizziness at times. Pt denies NVD. Pt ambulatory to triage.

## 2024-02-13 ENCOUNTER — Ambulatory Visit: Admitting: Orthopedic Surgery

## 2024-02-20 ENCOUNTER — Other Ambulatory Visit: Payer: Self-pay | Admitting: Primary Care

## 2024-02-20 ENCOUNTER — Ambulatory Visit (INDEPENDENT_AMBULATORY_CARE_PROVIDER_SITE_OTHER): Admitting: Primary Care

## 2024-02-20 ENCOUNTER — Encounter: Payer: Self-pay | Admitting: Primary Care

## 2024-02-20 VITALS — BP 120/82 | HR 93 | Temp 98.8°F | Ht 71.0 in | Wt 282.0 lb

## 2024-02-20 DIAGNOSIS — N63 Unspecified lump in unspecified breast: Secondary | ICD-10-CM | POA: Insufficient documentation

## 2024-02-20 NOTE — Patient Instructions (Signed)
 You will receive a phone call regarding the ultrasound of your breasts.   It was a pleasure to see you today!

## 2024-02-20 NOTE — Progress Notes (Signed)
 Subjective:    Patient ID: Marc Henderson, male    DOB: 1986/04/23, 38 y.o.   MRN: [id number]  HPI  Marc Henderson is a very pleasant 38 y.o. male with a history of chronic venous insufficiency, chronic low back pain, drug abuse in remission, asthma who presents today to discuss skin mass.  Over the last 8 months he's noticed deep skin masses underneath his bilateral nipples. Gradually he's noticed an increase in size with intermittent pain on each side. His nipples feel hard all the time, will wake up some mornings noticing a sharp pain.   He denies changes in skin texture, nipple discharge, family history of breast cancer, new supplements/medications. There has been no unexplained weight loss. He is scared of potentially having breast cancer.   Wt Readings from Last 3 Encounters:  02/20/24 282 lb (127.9 kg)  01/18/24 279 lb 15.8 oz (127 kg)  01/14/24 280 lb (127 kg)   BP Readings from Last 3 Encounters:  02/20/24 120/82  01/18/24 122/73  01/14/24 118/76      Review of Systems  Constitutional:  Negative for unexpected weight change.  Skin:  Negative for color change and wound.       Bilateral breast mass         Past Medical History:  Diagnosis Date   Alcohol abuse    Asthma    Chest wall pain    Current smoker    Insomnia    IV drug abuse (HCC)    Palpitation     Social History   Socioeconomic History   Marital status: Married    Spouse name: Not on file   Number of children: Not on file   Years of education: Not on file   Highest education level: Not on file  Occupational History   Not on file  Tobacco Use   Smoking status: Every Day    Current packs/day: 0.25    Average packs/day: 0.3 packs/day for 20.6 years (5.1 ttl pk-yrs)    Types: Cigarettes    Start date: 2005   Smokeless tobacco: Never   Tobacco comments:    Cut back to 1/4 ppd (11/27/23)  Vaping Use   Vaping status: Former  Substance and Sexual Activity   Alcohol use: Not  Currently    Comment: 1-2 beer/daily   Drug use: Not Currently    Comment: herion 3-4 times a week   Sexual activity: Yes  Other Topics Concern   Not on file  Social History Narrative   Not on file   Social Drivers of Health   Financial Resource Strain: Not on file  Food Insecurity: Not on file  Transportation Needs: Not on file  Physical Activity: Not on file  Stress: Not on file  Social Connections: Not on file  Intimate Partner Violence: Not on file    History reviewed. No pertinent surgical history.  Family History  Problem Relation Age of Onset   Hypertension Mother    Alcohol abuse Mother    COPD Father    Heart disease Father     Allergies  Allergen Reactions   Other Shortness Of Breath    Housing insulation   Dust Mite Extract Cough    Current Outpatient Medications on File Prior to Visit  Medication Sig Dispense Refill   albuterol  (VENTOLIN  HFA) 108 (90 Base) MCG/ACT inhaler Inhale 2 puffs into the lungs every 6 (six) hours as needed for wheezing or shortness of breath. 8 g 10  furosemide  (LASIX ) 20 MG tablet Take one po every day prn edema 20 tablet 0   metoprolol  succinate (TOPROL -XL) 25 MG 24 hr tablet Take 1 tablet (25 mg total) by mouth daily. For palpitations 90 tablet 0   mometasone -formoterol  (DULERA ) 200-5 MCG/ACT AERO Inhale 2 puffs into the lungs 2 (two) times daily. Rinse mouth after each use. 13 g 9   montelukast  (SINGULAIR ) 10 MG tablet TAKE 1 TABLET BY MOUTH EVERYDAY AT BEDTIME 90 tablet 1   omeprazole  (PRILOSEC) 20 MG capsule Take 1 capsule (20 mg total) by mouth daily. For heartburn 90 capsule 0   cyclobenzaprine  (FLEXERIL ) 10 MG tablet Take 1 tablet (10 mg total) by mouth 3 (three) times daily as needed for muscle spasms. (Patient not taking: Reported on 02/20/2024) 30 tablet 0   nicotine  (NICODERM CQ  - DOSED IN MG/24 HOURS) 21 mg/24hr patch Place 1 patch (21 mg total) onto the skin daily. (Patient not taking: Reported on 02/20/2024) 28 patch 1    No current facility-administered medications on file prior to visit.    BP 120/82   Pulse 93   Temp 98.8 F (37.1 C) (Temporal)   Ht 5' 11 (1.803 m)   Wt 282 lb (127.9 kg)   SpO2 97%   BMI 39.33 kg/m  Objective:   Physical Exam Constitutional:      General: He is not in acute distress. Cardiovascular:     Rate and Rhythm: Normal rate.  Pulmonary:     Effort: Pulmonary effort is normal.  Chest:  Breasts:    Right: Mass and tenderness present. No swelling or skin change.     Left: Mass and tenderness present. No swelling or skin change.       Comments: Soft, mobile, mildly tender masses to bilateral breast.  Right breast mass: 9 o'clock position approx 0.5 cm Left breast mass: 3 o'clock position approx 1 cm Lymphadenopathy:     Upper Body:     Right upper body: No axillary adenopathy.     Left upper body: No axillary adenopathy.  Skin:    General: Skin is warm and dry.     Findings: No erythema.  Neurological:     Mental Status: He is alert.           Assessment & Plan:  Breast mass in male Assessment & Plan: Exam today suggestive of epidermal cysts.   Given increased size and bothersome characteristics, will obtain bilateral breast US . He agrees.  Bilateral breast US  ordered and pending.  Orders: -     US  LIMITED ULTRASOUND INCLUDING AXILLA LEFT BREAST ; Future -     US  LIMITED ULTRASOUND INCLUDING AXILLA RIGHT BREAST; Future        Comer MARLA Gaskins, NP

## 2024-02-20 NOTE — Assessment & Plan Note (Signed)
 Exam today suggestive of epidermal cysts.   Given increased size and bothersome characteristics, will obtain bilateral breast US . He agrees.  Bilateral breast US  ordered and pending.

## 2024-02-24 ENCOUNTER — Ambulatory Visit
Admission: RE | Admit: 2024-02-24 | Discharge: 2024-02-24 | Disposition: A | Discharge status: Home / Self Care | Source: Ambulatory Visit | Attending: Primary Care | Admitting: Primary Care

## 2024-02-24 ENCOUNTER — Ambulatory Visit
Admission: RE | Admit: 2024-02-24 | Discharge: 2024-02-24 | Disposition: A | Discharge status: Home / Self Care | Source: Ambulatory Visit | Attending: Primary Care

## 2024-02-24 ENCOUNTER — Ambulatory Visit: Payer: Self-pay | Admitting: Primary Care

## 2024-02-24 DIAGNOSIS — N6310 Unspecified lump in the right breast, unspecified quadrant: Secondary | ICD-10-CM | POA: Diagnosis not present

## 2024-02-24 DIAGNOSIS — N6320 Unspecified lump in the left breast, unspecified quadrant: Secondary | ICD-10-CM | POA: Diagnosis not present

## 2024-02-24 DIAGNOSIS — N62 Hypertrophy of breast: Secondary | ICD-10-CM | POA: Diagnosis not present

## 2024-02-24 DIAGNOSIS — R92323 Mammographic fibroglandular density, bilateral breasts: Secondary | ICD-10-CM | POA: Diagnosis not present

## 2024-02-24 DIAGNOSIS — N63 Unspecified lump in unspecified breast: Secondary | ICD-10-CM

## 2024-03-05 ENCOUNTER — Ambulatory Visit (INDEPENDENT_AMBULATORY_CARE_PROVIDER_SITE_OTHER): Admitting: Nurse Practitioner

## 2024-03-09 ENCOUNTER — Other Ambulatory Visit: Payer: Self-pay | Admitting: Primary Care

## 2024-03-09 DIAGNOSIS — R002 Palpitations: Secondary | ICD-10-CM

## 2024-03-26 ENCOUNTER — Ambulatory Visit (INDEPENDENT_AMBULATORY_CARE_PROVIDER_SITE_OTHER): Admitting: Primary Care

## 2024-03-26 ENCOUNTER — Ambulatory Visit: Payer: Self-pay | Admitting: Primary Care

## 2024-03-26 ENCOUNTER — Encounter: Payer: Self-pay | Admitting: Primary Care

## 2024-03-26 VITALS — BP 118/72 | HR 131 | Temp 97.2°F | Ht 71.0 in | Wt 288.0 lb

## 2024-03-26 DIAGNOSIS — R1011 Right upper quadrant pain: Secondary | ICD-10-CM

## 2024-03-26 DIAGNOSIS — R110 Nausea: Secondary | ICD-10-CM | POA: Diagnosis not present

## 2024-03-26 LAB — COMPREHENSIVE METABOLIC PANEL WITH GFR
ALT: 19 U/L (ref 0–53)
AST: 15 U/L (ref 0–37)
Albumin: 4.3 g/dL (ref 3.5–5.2)
Alkaline Phosphatase: 58 U/L (ref 39–117)
BUN: 13 mg/dL (ref 6–23)
CO2: 28 meq/L (ref 19–32)
Calcium: 9 mg/dL (ref 8.4–10.5)
Chloride: 102 meq/L (ref 96–112)
Creatinine, Ser: 1.04 mg/dL (ref 0.40–1.50)
GFR: 92.39 mL/min (ref >60.00)
Glucose, Bld: 93 mg/dL (ref 70–99)
Potassium: 4.3 meq/L (ref 3.5–5.1)
Sodium: 139 meq/L (ref 135–145)
Total Bilirubin: 0.3 mg/dL (ref 0.2–1.2)
Total Protein: 7.1 g/dL (ref 6.0–8.3)

## 2024-03-26 LAB — CBC WITH DIFFERENTIAL/PLATELET
Basophils Absolute: 0.1 K/uL (ref 0.0–0.1)
Basophils Relative: 0.9 % (ref 0.0–3.0)
Eosinophils Absolute: 0.3 K/uL (ref 0.0–0.7)
Eosinophils Relative: 3.3 % (ref 0.0–5.0)
HCT: 42.3 % (ref 39.0–52.0)
Hemoglobin: 14.2 g/dL (ref 13.0–17.0)
Lymphocytes Relative: 29.1 % (ref 12.0–46.0)
Lymphs Abs: 2.3 K/uL (ref 0.7–4.0)
MCHC: 33.6 g/dL (ref 30.0–36.0)
MCV: 83.5 fl (ref 78.0–100.0)
Monocytes Absolute: 0.5 K/uL (ref 0.1–1.0)
Monocytes Relative: 5.9 % (ref 3.0–12.0)
Neutro Abs: 4.9 K/uL (ref 1.4–7.7)
Neutrophils Relative %: 60.8 % (ref 43.0–77.0)
Platelets: 331 K/uL (ref 150.0–400.0)
RBC: 5.06 Mil/uL (ref 4.22–5.81)
RDW: 13 % (ref 11.5–15.5)
WBC: 8 K/uL (ref 4.0–10.5)

## 2024-03-26 LAB — LIPASE: Lipase: 14 U/L (ref 11.0–59.0)

## 2024-03-26 MED ORDER — ONDANSETRON 4 MG PO TBDP: 4.0000 mg | ORAL_TABLET | Freq: Three times a day (TID) | ORAL | 0 refills | Status: AC | PRN

## 2024-03-26 NOTE — Assessment & Plan Note (Signed)
 Symptoms suspicious for gallbladder involvement.  Exam today overall stable.  Stat right upper quadrant abdominal ultrasound ordered and pending. Labs pending today including CMP, lipase and CBC with differential.  Prescription for Zofran  ODT 4 mg at pharmacy to use every 8 hours as needed.  Await results.

## 2024-03-26 NOTE — Progress Notes (Signed)
 Subjective:    Patient ID: Marc Henderson, male    DOB: 1985/08/25, 38 y.o.   MRN: [id number]  Marc Henderson is a very pleasant 38 y.o. male with a history of chronic low back pain, drug abuse in remission, lower extremity edema, tobacco use, chronic venous insufficiency, asthma who presents today to discuss nausea.  His nausea began about 6 weeks ago which occurs daily. His nausea is the worst in the morning and will occur intermittently throughout the day. He's also noticed RUQ abdominal pain that began a few weeks later.  His pain will intermittently radiate through his back.  The nausea and pain will sometimes occur together, with and without meals. He's missed several days at work due to his symptoms.   He does have a history of GERD, denies increased symptoms recently. He does take famotidine or omeprazole  20 mg 3-4 days weekly for symptoms.   He denies vomiting, fevers, diarrhea. He does eat a biscuit from [employer] every morning.  He did take omeprazole  consistently for about 1 week and did not notice improvement in symptoms.   Review of Systems  Constitutional:  Negative for fever.  Gastrointestinal:  Positive for abdominal pain and nausea. Negative for constipation, diarrhea and vomiting.         Past Medical History:  Diagnosis Date   Alcohol abuse    Asthma    Chest wall pain    Current smoker    Insomnia    IV drug abuse (HCC)    Palpitation     Social History   Socioeconomic History   Marital status: Married    Spouse name: Not on file   Number of children: Not on file   Years of education: Not on file   Highest education level: Not on file  Occupational History   Not on file  Tobacco Use   Smoking status: Every Day    Current packs/day: 0.25    Average packs/day: 0.3 packs/day for 20.7 years (5.2 ttl pk-yrs)    Types: Cigarettes    Start date: 2005   Smokeless tobacco: Never   Tobacco comments:    Cut back to 1/4 ppd (11/27/23)  Vaping Use    Vaping status: Former  Substance and Sexual Activity   Alcohol use: Not Currently    Comment: 1-2 beer/daily   Drug use: Not Currently    Comment: herion 3-4 times a week   Sexual activity: Yes  Other Topics Concern   Not on file  Social History Narrative   Not on file   Social Drivers of Health   Financial Resource Strain: Not on file  Food Insecurity: Not on file  Transportation Needs: Not on file  Physical Activity: Not on file  Stress: Not on file  Social Connections: Not on file  Intimate Partner Violence: Not on file    History reviewed. No pertinent surgical history.  Family History  Problem Relation Age of Onset   Hypertension Mother    Alcohol abuse Mother    COPD Father    Heart disease Father    Breast cancer Neg Hx     Allergies  Allergen Reactions   Other Shortness Of Breath    Housing insulation   Dust Mite Extract Cough    Current Outpatient Medications on File Prior to Visit  Medication Sig Dispense Refill   albuterol  (VENTOLIN  HFA) 108 (90 Base) MCG/ACT inhaler Inhale 2 puffs into the lungs every 6 (six) hours as needed for wheezing  or shortness of breath. 8 g 10   furosemide  (LASIX ) 20 MG tablet Take one po every day prn edema 20 tablet 0   metoprolol  succinate (TOPROL -XL) 25 MG 24 hr tablet TAKE 1 TABLET (25 MG TOTAL) BY MOUTH DAILY. FOR PALPITATIONS 90 tablet 0   mometasone -formoterol  (DULERA ) 200-5 MCG/ACT AERO Inhale 2 puffs into the lungs 2 (two) times daily. Rinse mouth after each use. 13 g 9   montelukast  (SINGULAIR ) 10 MG tablet TAKE 1 TABLET BY MOUTH EVERYDAY AT BEDTIME 90 tablet 1   omeprazole  (PRILOSEC) 20 MG capsule Take 1 capsule (20 mg total) by mouth daily. For heartburn 90 capsule 0   cyclobenzaprine  (FLEXERIL ) 10 MG tablet Take 1 tablet (10 mg total) by mouth 3 (three) times daily as needed for muscle spasms. (Patient not taking: Reported on 03/26/2024) 30 tablet 0   nicotine  (NICODERM CQ  - DOSED IN MG/24 HOURS) 21 mg/24hr patch  Place 1 patch (21 mg total) onto the skin daily. (Patient not taking: Reported on 03/26/2024) 28 patch 1   No current facility-administered medications on file prior to visit.    BP 118/72   Pulse (!) 131   Temp (!) 97.2 F (36.2 C) (Temporal)   Ht 5' 11 (1.803 m)   Wt 288 lb (130.6 kg)   SpO2 98%   BMI 40.17 kg/m  Objective:   Physical Exam Cardiovascular:     Rate and Rhythm: Normal rate.  Pulmonary:     Effort: Pulmonary effort is normal.  Abdominal:     Tenderness: There is abdominal tenderness in the left lower quadrant. Negative signs include Murphy's sign.  Musculoskeletal:     Cervical back: Neck supple.  Skin:    General: Skin is warm and dry.  Neurological:     Mental Status: He is alert and oriented to person, place, and time.  Psychiatric:        Mood and Affect: Mood normal.     Physical Exam        Assessment & Plan:  RUQ abdominal pain Assessment & Plan: Symptoms suspicious for gallbladder involvement.  Exam today overall stable.  Stat right upper quadrant abdominal ultrasound ordered and pending. Labs pending today including CMP, lipase and CBC with differential.  Prescription for Zofran  ODT 4 mg at pharmacy to use every 8 hours as needed.  Await results.  Orders: -     US  ABDOMEN LIMITED RUQ (LIVER/GB); Future -     Comprehensive metabolic panel with GFR -     CBC with Differential/Platelet -     Lipase -     Ondansetron ; Take 1 tablet (4 mg total) by mouth every 8 (eight) hours as needed for nausea or vomiting.  Dispense: 20 tablet; Refill: 0  Nausea -     US  ABDOMEN LIMITED RUQ (LIVER/GB); Future -     Comprehensive metabolic panel with GFR -     CBC with Differential/Platelet -     Lipase -     Ondansetron ; Take 1 tablet (4 mg total) by mouth every 8 (eight) hours as needed for nausea or vomiting.  Dispense: 20 tablet; Refill: 0    Assessment and Plan Assessment & Plan         Comer MARLA Gaskins, NP     History of  Present Illness

## 2024-03-26 NOTE — Patient Instructions (Signed)
 You will receive a phone call regarding the ultrasound.  Stop by the lab prior to leaving today. I will notify you of your results once received.   You may take the ondansetron  (Zofran ) nausea medication every 8 hours as needed.  Use sparingly.  It was a pleasure to see you today!

## 2024-04-01 ENCOUNTER — Ambulatory Visit
Admission: RE | Admit: 2024-04-01 | Discharge: 2024-04-01 | Disposition: A | Discharge status: Home / Self Care | Source: Ambulatory Visit | Attending: Primary Care | Admitting: Primary Care

## 2024-04-01 DIAGNOSIS — R1011 Right upper quadrant pain: Secondary | ICD-10-CM | POA: Diagnosis not present

## 2024-04-01 DIAGNOSIS — R110 Nausea: Secondary | ICD-10-CM | POA: Insufficient documentation

## 2024-05-20 ENCOUNTER — Emergency Department

## 2024-05-20 ENCOUNTER — Encounter: Payer: Self-pay | Admitting: Emergency Medicine

## 2024-05-20 ENCOUNTER — Other Ambulatory Visit: Payer: Self-pay

## 2024-05-20 ENCOUNTER — Emergency Department
Admission: EM | Admit: 2024-05-20 | Discharge: 2024-05-20 | Disposition: A | Discharge status: Home / Self Care | Attending: Emergency Medicine | Admitting: Emergency Medicine

## 2024-05-20 DIAGNOSIS — R42 Dizziness and giddiness: Secondary | ICD-10-CM | POA: Diagnosis not present

## 2024-05-20 DIAGNOSIS — R062 Wheezing: Secondary | ICD-10-CM | POA: Diagnosis not present

## 2024-05-20 DIAGNOSIS — R079 Chest pain, unspecified: Secondary | ICD-10-CM | POA: Diagnosis not present

## 2024-05-20 DIAGNOSIS — R002 Palpitations: Secondary | ICD-10-CM | POA: Insufficient documentation

## 2024-05-20 DIAGNOSIS — R0602 Shortness of breath: Secondary | ICD-10-CM | POA: Diagnosis not present

## 2024-05-20 LAB — BASIC METABOLIC PANEL WITH GFR
Anion gap: 11 (ref 5–15)
BUN: 16 mg/dL (ref 6–20)
CO2: 25 mmol/L (ref 22–32)
Calcium: 8.9 mg/dL (ref 8.9–10.3)
Chloride: 103 mmol/L (ref 98–111)
Creatinine, Ser: 1.07 mg/dL (ref 0.61–1.24)
GFR, Estimated: >60 mL/min (ref >60)
Glucose, Bld: 120 mg/dL — ABNORMAL HIGH (ref 70–99)
Potassium: 4 mmol/L (ref 3.5–5.1)
Sodium: 139 mmol/L (ref 135–145)

## 2024-05-20 LAB — CBC
HCT: 41 % (ref 39.0–52.0)
Hemoglobin: 13.8 g/dL (ref 13.0–17.0)
MCH: 28 pg (ref 26.0–34.0)
MCHC: 33.7 g/dL (ref 30.0–36.0)
MCV: 83.3 fL (ref 80.0–100.0)
Platelets: 326 K/uL (ref 150–400)
RBC: 4.92 MIL/uL (ref 4.22–5.81)
RDW: 12.4 % (ref 11.5–15.5)
WBC: 6.1 K/uL (ref 4.0–10.5)
nRBC: 0 % (ref 0.0–0.2)

## 2024-05-20 LAB — TROPONIN I (HIGH SENSITIVITY)
Troponin I (High Sensitivity): 3 ng/L (ref <18)
Troponin I (High Sensitivity): 4 ng/L (ref <18)

## 2024-05-20 MED ORDER — IPRATROPIUM-ALBUTEROL 0.5-2.5 (3) MG/3ML IN SOLN
3.0000 mL | Freq: Once | RESPIRATORY_TRACT | Status: DC
  Filled 2024-05-20: qty 3

## 2024-05-20 NOTE — ED Triage Notes (Signed)
 Pt via POV from home. Pt c/o L sided CP, constant, non-radiating, sharp started 3 days ago but consistently got worse. This AM reports walking around [employer] today and he became SOB and lightheaded. Pt has a hx of heart palpitations and takes metoprolol . Pt is A&OX4 and NAD, ambulatory to triage with steady gait.

## 2024-05-20 NOTE — ED Notes (Signed)
 This RN reviewed paperwork with pt. No further complaints or questions. Pt ambulated to lobby.

## 2024-05-20 NOTE — ED Provider Notes (Signed)
 [facility] Provider Note    Event Date/Time   First MD Initiated Contact with Patient 05/20/24 1028     (approximate)   History   Chest Pain   HPI  Marc Henderson is a 38 y.o. male known to the emergency department with heart palpitations and feeling short of breath.  Patient states that he has had a history of heart palpitations in the past but they have been significantly worse over the past 3 days.  States that he will intermittently have a fluttering and feeling like his heart is going to beat out of his chest with some shortness of breath and lightheadedness.  Denies any significant chest pain or sharp stabbing pain.  States that he has a light pressure pain whenever he feels the fluttering and then it goes away.  Denies any pain that radiates to his back.  States that he just wore a Zio patch for approximately 3 weeks and during that time he had multiple episodes but none were as severe as they were today.  States that he has not followed up with cardiology but he was started on metoprolol  which he has been taking.  Denies any marijuana use, drug use or significant alcohol use.  Denies any significant caffeine intake.  No history of DVT or PE.  Denies any significant shortness of breath at rest.  No known family history of coronary artery disease at a young age.  No significant exertional symptoms.  No change with position.  No abdominal pain, nausea or vomiting.  Tobacco use     Physical Exam   Triage Vital Signs: ED Triage Vitals  Encounter Vitals Group     BP 05/20/24 0841 118/80     Girls Systolic BP Percentile --      Girls Diastolic BP Percentile --      Boys Systolic BP Percentile --      Boys Diastolic BP Percentile --      Pulse Rate 05/20/24 0841 73     Resp 05/20/24 0841 20     Temp 05/20/24 0841 97.7 F (36.5 C)     Temp Source 05/20/24 0841 Oral     SpO2 05/20/24 0841 98 %     Weight 05/20/24 0840 280 lb (127 kg)     Height 05/20/24  0840 5' 11 (1.803 m)     Head Circumference --      Peak Flow --      Pain Score 05/20/24 0840 6     Pain Loc --      Pain Education --      Exclude from Growth Chart --     Most recent vital signs: Vitals:   05/20/24 1200 05/20/24 1230  BP: 107/60 97/64  Pulse: (!) 57 62  Resp: 15 14  Temp:    SpO2:      Physical Exam Constitutional:      Appearance: He is well-developed.  HENT:     Head: Atraumatic.  Eyes:     Conjunctiva/sclera: Conjunctivae normal.  Cardiovascular:     Rate and Rhythm: Regular rhythm.     Pulses:          Radial pulses are 2+ on the right side and 2+ on the left side.       Dorsalis pedis pulses are 2+ on the right side and 2+ on the left side.     Heart sounds: Normal heart sounds.  Pulmonary:     Effort: No respiratory  distress.     Breath sounds: Wheezing present.  Musculoskeletal:     Cervical back: Normal range of motion.     Right lower leg: No edema.     Left lower leg: No edema.  Skin:    General: Skin is warm.     Capillary Refill: Capillary refill takes less than 2 seconds.  Neurological:     General: No focal deficit present.     Mental Status: He is alert. Mental status is at baseline.     IMPRESSION / MDM / ASSESSMENT AND PLAN / ED COURSE  I reviewed the triage vital signs and the nursing notes.  Differential diagnosis including dysrhythmia, electrolyte abnormality, ACS, pneumonia, anxiety/stress, GERD.  No tearing chest pain, no risk factors, no significant hypertension, equal and symmetric pulses, have a low suspicion for dissection.  Low risk Wells criteria and PERC negative, have a low suspicion for pulmonary embolism.  Patient is on a beta-blocker but no other findings consistent with PE or DVT.  EKG  I, Clotilda Punter, the attending physician, personally viewed and interpreted this ECG.  EKG showed normal sinus rhythm.  Normal intervals.  No chamber enlargement.  No significant ST elevation or depression.  No findings of  acute ischemia or dysrhythmia.  No tachycardic or bradycardic dysrhythmias while on cardiac telemetry.  Called out multiple times in the emergency department that he was having the sensation that he has been having, patient did have multiple noted PVCs but I did not see any other dysrhythmias, no tachycardic or bradycardic dysrhythmias.  This consistent with normal sinus rhythm with heart rates that were mostly in the 60s.  RADIOLOGY I independently reviewed imaging, my interpretation of imaging: No acute findings.  Read as no acute findings.  LABS (all labs ordered are listed, but only abnormal results are displayed) Labs interpreted as -    Labs Reviewed  BASIC METABOLIC PANEL WITH GFR - Abnormal; Notable for the following components:      Result Value   Glucose, Bld 120 (*)    All other components within normal limits  CBC  TROPONIN I (HIGH SENSITIVITY)  TROPONIN I (HIGH SENSITIVITY)     MDM  Lab work over all reassuring with no significant leukocytosis or anemia.  Creatinine appears to be at baseline.  No significant electrolyte abnormality.  Initial troponin is negative at 3.  Low risk heart score.  Serial troponins are negative.  Cardiac telemetry with intermittent PVCs but no other dysrhythmias noted.  No significant electrolyte abnormalities.  No signs or symptoms consistent with GI bleed.  Patient does have wheezing on exam.  Discussed possible treatment with a nebulizer given his tobacco use possible mild COPD exacerbation.  Patient declined.  No findings on EKG and no positional changes, have a low suspicion for pericarditis  Discussed close follow-up as an outpatient with primary care physician.  Discussed close follow-up with his cardiologist.  Discussed tobacco cessation and return to the emergency department for any ongoing or worsening symptoms.     PROCEDURES:  Critical Care performed: No  Procedures  Patient's presentation is most consistent with acute  presentation with potential threat to life or bodily function.   MEDICATIONS ORDERED IN ED: Medications - No data to display  FINAL CLINICAL IMPRESSION(S) / ED DIAGNOSES   Final diagnoses:  Palpitations     Rx / DC Orders   ED Discharge Orders          Ordered    Ambulatory referral to Cardiology  Comments: If you have not heard from the Cardiology office within the next 72 hours please call [phone].   05/20/24 1225             Note:  This document was prepared using Dragon voice recognition software and may include unintentional dictation errors.   Suzanne Kirsch, MD 05/20/24 [id number]

## 2024-05-20 NOTE — Discharge Instructions (Addendum)
 You were seen in the urgency department for heart palpitations.  You had multiple episodes of heart palpitations while you are on cardiac telemetry in the emergency department.  I did not see any abnormal heart rhythms.  You did have multiple beats of PVCs which are premature ventricular beats.  Your heart enzyme and your lab work was overall normal.  Your chest x-ray was normal.  You are given a referral for cardiology for further evaluation of possible echocardiogram or further Holter monitoring as an outpatient.  It is important that if your symptoms are ongoing or worsen or if you develop any new concerning symptoms that you return to the emergency department for reevaluation.  Stay hydrated and drink plenty of fluids.  Avoid any caffeinated beverages.  Work on stopping smoking.

## 2024-05-20 NOTE — ED Notes (Signed)
 Pt reports palpitation feelings in his chest like his heart is racing and same is making him feel like he is going to pass out. Pt's pulse felt at this time and is consistently in the 60's and matches the rhythm shown on monitor.

## 2024-05-20 NOTE — ED Notes (Signed)
 Pt continuing to call out for CP. EDP made aware. No acute abnormalities seen on cardiac monitor.

## 2024-05-24 ENCOUNTER — Other Ambulatory Visit: Payer: Self-pay | Admitting: Family

## 2024-05-24 ENCOUNTER — Other Ambulatory Visit: Payer: Self-pay | Admitting: Primary Care

## 2024-05-24 DIAGNOSIS — R600 Localized edema: Secondary | ICD-10-CM

## 2024-05-24 DIAGNOSIS — J4540 Moderate persistent asthma, uncomplicated: Secondary | ICD-10-CM

## 2024-05-25 ENCOUNTER — Encounter: Payer: Self-pay | Admitting: Radiology

## 2024-06-02 ENCOUNTER — Ambulatory Visit: Admitting: Internal Medicine

## 2024-06-04 ENCOUNTER — Encounter: Payer: Self-pay | Admitting: Internal Medicine

## 2024-06-14 DIAGNOSIS — I493 Ventricular premature depolarization: Secondary | ICD-10-CM | POA: Insufficient documentation

## 2024-06-14 NOTE — Progress Notes (Unsigned)
 Cardiology Office Note  Date:  06/15/2024   ID:  RAHEEL KUNKLE, DOB 14-Feb-1986, MRN [id number]  PCP:  Gretta Comer POUR, NP   Chief Complaint  Patient presents with   New Patient (Initial Visit)    Follow up [facility] ER; palpitations. Patient c/o chest pain that radiates into his back & left arm with a numbness & tingling sensation.     HPI:  Marc Henderson is a 38 y.o. male with past medical history of: Past Medical History:  Diagnosis Date   Alcohol abuse    Asthma    Chest wall pain    Current smoker    Insomnia    IV drug abuse (HCC)    Palpitation   Anxiety Who presents by referral from Dr. Clotilda Punter for consultation of his chest pain and palpitations  Reports having some issues with anxiety, Currently not on medications  Issues with sharp chest pain radiating through to his back, presenting at rest, with associated numbness tingling into his hand on the left Also some palpitations  Seen in the emergency room May 20, 2024 for palpitations and shortness of breath Prior history of heart palpitations Reports symptoms were worse leading up to the ER visit intermittently have a fluttering and feeling like his heart is going to beat out of his chest with some shortness of breath and lightheadedness.   wore a Zio patch for approximately 3 weeks and during that time he had multiple episodes but none were as severe as they were today.  started on metoprolol  which he has been taking.  Per the notes, called out multiple times in the emergency department that he was having the sensation that he has been having, patient did have multiple noted PVCs   Event monitor May 2025 HR 43 - 173, average 84 bpm. 1 nonsustained SVT lasting 7 beats. Rare supraventricular and ventricular ectopy. No sustained arrhythmias. No atrial fibrillation. Symptom activations correspond to sinus rhythm with rare PVC's.  CT scan from 2001 images pulled up and reviewed No significant aortic  atherosclerosis or coronary calcification  EKG personally reviewed by myself on todays visit EKG Interpretation Date/Time:  Monday June 15 2024 16:00:25 EST Ventricular Rate:  77 PR Interval:  180 QRS Duration:  94 QT Interval:  400 QTC Calculation: 452 R Axis:   24  Text Interpretation: Normal sinus rhythm Normal ECG When compared with ECG of 20-May-2024 12:17, No significant change was found Confirmed by Perla Lye [phone]) on 06/15/2024 4:11:05 PM    PMH:   has a past medical history of Alcohol abuse, Asthma, Chest wall pain, Current smoker, Insomnia, IV drug abuse (HCC), and Palpitation.   PSH:   History reviewed. No pertinent surgical history.  Current Outpatient Medications  Medication Sig Dispense Refill   albuterol  (VENTOLIN  HFA) 108 (90 Base) MCG/ACT inhaler Inhale 2 puffs into the lungs every 6 (six) hours as needed for wheezing or shortness of breath. 8 g 10   furosemide  (LASIX ) 20 MG tablet Take one po every day prn edema 20 tablet 0   metoprolol  succinate (TOPROL -XL) 25 MG 24 hr tablet TAKE 1 TABLET (25 MG TOTAL) BY MOUTH DAILY. FOR PALPITATIONS 90 tablet 0   mometasone -formoterol  (DULERA ) 200-5 MCG/ACT AERO INHALE 2 PUFFS INTO THE LUNGS 2 (TWO) TIMES DAILY. RINSE MOUTH AFTER EACH USE. 13 g 2   montelukast  (SINGULAIR ) 10 MG tablet TAKE 1 TABLET BY MOUTH EVERYDAY AT BEDTIME 90 tablet 1   omeprazole  (PRILOSEC) 20 MG capsule Take 1  capsule (20 mg total) by mouth daily. For heartburn 90 capsule 0   cyclobenzaprine  (FLEXERIL ) 10 MG tablet Take 1 tablet (10 mg total) by mouth 3 (three) times daily as needed for muscle spasms. (Patient not taking: Reported on 06/15/2024) 30 tablet 0   nicotine  (NICODERM CQ  - DOSED IN MG/24 HOURS) 21 mg/24hr patch Place 1 patch (21 mg total) onto the skin daily. (Patient not taking: Reported on 06/15/2024) 28 patch 1   ondansetron  (ZOFRAN -ODT) 4 MG disintegrating tablet Take 1 tablet (4 mg total) by mouth every 8 (eight) hours as needed for  nausea or vomiting. (Patient not taking: Reported on 06/15/2024) 20 tablet 0   No current facility-administered medications for this visit.    Allergies:   Other and Dust mite extract   Social History:  The patient  reports that he has been smoking cigarettes. He started smoking about 20 years ago. He has a 5.2 pack-year smoking history. He has never used smokeless tobacco. He reports that he does not currently use alcohol. He reports that he does not currently use drugs.   Family History:   family history includes Alcohol abuse in his mother; COPD in his father; Heart disease in his father; Hypertension in his brother and mother.    Review of Systems: Review of Systems  Constitutional: Negative.   Respiratory: Negative.    Cardiovascular:  Positive for chest pain and palpitations.  Gastrointestinal: Negative.   Musculoskeletal: Negative.   Neurological: Negative.   Psychiatric/Behavioral: Negative.    All other systems reviewed and are negative.   PHYSICAL EXAM: VS:  BP 120/70 (BP Location: Left Arm, Patient Position: Sitting, Cuff Size: Large)   Pulse 77   Ht 5' 11 (1.803 m)   Wt 285 lb 8 oz (129.5 kg)   SpO2 96%   BMI 39.82 kg/m  , BMI Body mass index is 39.82 kg/m. GEN: Well nourished, well developed, in no acute distress HEENT: normal Neck: no JVD, carotid bruits, or masses Cardiac: RRR; no murmurs, rubs, or gallops,no edema  Respiratory:  clear to auscultation bilaterally, normal work of breathing GI: soft, nontender, nondistended, + BS MS: no deformity or atrophy Skin: warm and dry, no rash Neuro:  Strength and sensation are intact Psych: euthymic mood, full affect  Recent Labs: 10/22/2023: TSH 0.73 03/26/2024: ALT 19 05/20/2024: BUN 16; Creatinine, Ser 1.07; Hemoglobin 13.8; Platelets 326; Potassium 4.0; Sodium 139    Lipid Panel Lab Results  Component Value Date   CHOL 177 10/22/2023   HDL 43.40 10/22/2023   LDLCALC 83 10/22/2023   TRIG 251.0 (H)  10/22/2023      Wt Readings from Last 3 Encounters:  06/15/24 285 lb 8 oz (129.5 kg)  05/20/24 280 lb (127 kg)  03/26/24 288 lb (130.6 kg)     ASSESSMENT AND PLAN:  Problem List Items Addressed This Visit       Cardiology Problems   PVC's (premature ventricular contractions) - Primary   Relevant Orders   EKG 12-Lead (Completed)     Other   Moderate persistent asthma, uncomplicated   Tobacco use   Palpitations   Relevant Orders   EKG 12-Lead (Completed)   Atypical chest pain Low risk, presenting at rest Pinpoint location mediastinal area radiating through to his back, can present while in bed, while driving -CT chest 7978 with no coronary calcification or aortic atherosclerosis - Low likelihood of cardiac disease - With that said, risk stratification ordered with repeat/updated calcium scoring  Smoker Smoking cessation recommended  Palpitations Rare PVCs noted less than 1% Recommend he continue metoprolol  succinate 25 daily  Anxiety Reports this may be his main issue, we will reach out to primary care He may benefit from SSRI for symptoms  Seen in consultation for Dr. Suzanne and he be referred back to primary care for follow-up of the details above  Signed, Velinda Lunger, M.D., Ph.D. [facility] [place], [place] [phone]

## 2024-06-15 ENCOUNTER — Encounter: Payer: Self-pay | Admitting: Cardiovascular Disease

## 2024-06-15 ENCOUNTER — Ambulatory Visit: Attending: Cardiovascular Disease | Admitting: Cardiovascular Disease

## 2024-06-15 VITALS — BP 120/70 | HR 77 | Ht 71.0 in | Wt 285.5 lb

## 2024-06-15 DIAGNOSIS — Z720 Tobacco use: Secondary | ICD-10-CM

## 2024-06-15 DIAGNOSIS — I493 Ventricular premature depolarization: Secondary | ICD-10-CM

## 2024-06-15 DIAGNOSIS — J4540 Moderate persistent asthma, uncomplicated: Secondary | ICD-10-CM

## 2024-06-15 DIAGNOSIS — R079 Chest pain, unspecified: Secondary | ICD-10-CM

## 2024-06-15 DIAGNOSIS — R002 Palpitations: Secondary | ICD-10-CM

## 2024-06-15 NOTE — Patient Instructions (Addendum)
 Medication Instructions:   No changes  If you need a refill on your cardiac medications before your next appointment, please call your pharmacy.   Lab work: No new labs needed  Testing/Procedures:  Your physician has recommended that you have CT Coronary Calcium Score.  - $99 out of pocket cost at the time of your test - Call [phone] to schedule at your convenience.  [facility] [address] [place], [place] [address]   Coronary CalciumScan A coronary calcium scan is an imaging test used to look for deposits of calcium and other fatty materials (plaques) in the inner lining of the blood vessels of the heart (coronary arteries). These deposits of calcium and plaques can partly clog and narrow the coronary arteries without producing any symptoms or warning signs. This puts a person at risk for a heart attack. This test can detect these deposits before symptoms develop. Tell a health care provider about: Any allergies you have. All medicines you are taking, including vitamins, herbs, eye drops, creams, and over-the-counter medicines. Any problems you or family members have had with anesthetic medicines. Any blood disorders you have. Any surgeries you have had. Any medical conditions you have. Whether you are pregnant or may be pregnant. What are the risks? Generally, this is a safe procedure. However, problems may occur, including: Harm to a pregnant woman and her unborn baby. This test involves the use of radiation. Radiation exposure can be dangerous to a pregnant woman and her unborn baby. If you are pregnant, you generally should not have this procedure done. Slight increase in the risk of cancer. This is because of the radiation involved in the test. What happens before the procedure? No preparation is needed for this procedure. What happens during the procedure? You will undress and remove any jewelry around your neck or  chest. You will put on a hospital gown. Sticky electrodes will be placed on your chest. The electrodes will be connected to an electrocardiogram (ECG) machine to record a tracing of the electrical activity of your heart. A CT scanner will take pictures of your heart. During this time, you will be asked to lie still and hold your breath for 2-3 seconds while a picture of your heart is being taken. The procedure may vary among health care providers and hospitals. What happens after the procedure? You can get dressed. You can return to your normal activities. It is up to you to get the results of your test. Ask your health care provider, or the department that is doing the test, when your results will be ready. Summary A coronary calcium scan is an imaging test used to look for deposits of calcium and other fatty materials (plaques) in the inner lining of the blood vessels of the heart (coronary arteries). Generally, this is a safe procedure. Tell your health care provider if you are pregnant or may be pregnant. No preparation is needed for this procedure. A CT scanner will take pictures of your heart. You can return to your normal activities after the scan is done. This information is not intended to replace advice given to you by your health care provider. Make sure you discuss any questions you have with your health care provider. Document Released: 01/05/2008 Document Revised: 05/28/2016 Document Reviewed: 05/28/2016 Elsevier Interactive Patient Education  2017 [employer].   Follow-Up: At [facility], you and your health needs are our priority.  As part of our continuing mission to provide you with  exceptional heart care, we have created designated Provider Care Teams.  These Care Teams include your primary Cardiologist (physician) and Advanced Practice Providers (APPs -  Physician Assistants and Nurse Practitioners) who all work together to provide you with the care you need, when you  need it.  You will need a follow up appointment as needed  Providers on your designated Care Team:   Lonni Meager, NP Bernardino Bring, PA-C Cadence [place], [place]  COVID-19 Vaccine Information can be found at: [email or web address] For questions related to vaccine distribution or appointments, please email [email or web address] or call [phone].

## 2024-06-16 NOTE — Progress Notes (Signed)
 Lvm to schedule either virtual or in person apt to discuss anxiety

## 2024-06-24 ENCOUNTER — Ambulatory Visit: Payer: Self-pay | Admitting: Cardiovascular Disease

## 2024-06-24 ENCOUNTER — Ambulatory Visit
Admission: RE | Admit: 2024-06-24 | Discharge: 2024-06-24 | Payer: Self-pay | Attending: Cardiovascular Disease | Admitting: Cardiovascular Disease

## 2024-06-24 DIAGNOSIS — R079 Chest pain, unspecified: Secondary | ICD-10-CM | POA: Insufficient documentation

## 2024-06-25 ENCOUNTER — Encounter: Payer: Self-pay | Admitting: Emergency Medicine

## 2024-07-22 ENCOUNTER — Other Ambulatory Visit: Payer: Self-pay | Admitting: Family

## 2024-07-22 DIAGNOSIS — J4541 Moderate persistent asthma with (acute) exacerbation: Secondary | ICD-10-CM

## 2024-08-24 ENCOUNTER — Ambulatory Visit: Admitting: Internal Medicine

## 2024-09-22 ENCOUNTER — Ambulatory Visit: Admitting: Internal Medicine
